# Patient Record
Sex: Male | Born: 1945 | Race: White | Hispanic: No | Smoking: Former smoker
Health system: Southern US, Community
[De-identification: ages and names within clinical notes are randomized; demographics above are authoritative.]

## PROBLEM LIST (undated history)

## (undated) DIAGNOSIS — Z85828 Personal history of other malignant neoplasm of skin: Secondary | ICD-10-CM

## (undated) DIAGNOSIS — K621 Rectal polyp: Secondary | ICD-10-CM

## (undated) DIAGNOSIS — E039 Hypothyroidism, unspecified: Secondary | ICD-10-CM

## (undated) DIAGNOSIS — I2699 Other pulmonary embolism without acute cor pulmonale: Secondary | ICD-10-CM

## (undated) DIAGNOSIS — H269 Unspecified cataract: Secondary | ICD-10-CM

## (undated) DIAGNOSIS — Z860101 Personal history of adenomatous and serrated colon polyps: Secondary | ICD-10-CM

## (undated) DIAGNOSIS — I10 Essential (primary) hypertension: Secondary | ICD-10-CM

## (undated) DIAGNOSIS — T7840XA Allergy, unspecified, initial encounter: Secondary | ICD-10-CM

## (undated) DIAGNOSIS — I714 Abdominal aortic aneurysm, without rupture, unspecified: Secondary | ICD-10-CM

## (undated) DIAGNOSIS — F329 Major depressive disorder, single episode, unspecified: Secondary | ICD-10-CM

## (undated) DIAGNOSIS — C4490 Unspecified malignant neoplasm of skin, unspecified: Secondary | ICD-10-CM

## (undated) DIAGNOSIS — N401 Enlarged prostate with lower urinary tract symptoms: Secondary | ICD-10-CM

## (undated) DIAGNOSIS — C801 Malignant (primary) neoplasm, unspecified: Secondary | ICD-10-CM

## (undated) DIAGNOSIS — K130 Diseases of lips: Secondary | ICD-10-CM

## (undated) DIAGNOSIS — Z973 Presence of spectacles and contact lenses: Secondary | ICD-10-CM

## (undated) DIAGNOSIS — F32A Depression, unspecified: Secondary | ICD-10-CM

## (undated) DIAGNOSIS — D751 Secondary polycythemia: Secondary | ICD-10-CM

## (undated) DIAGNOSIS — C439 Malignant melanoma of skin, unspecified: Secondary | ICD-10-CM

## (undated) DIAGNOSIS — Z86010 Personal history of colonic polyps: Secondary | ICD-10-CM

## (undated) DIAGNOSIS — Z7901 Long term (current) use of anticoagulants: Secondary | ICD-10-CM

## (undated) DIAGNOSIS — E079 Disorder of thyroid, unspecified: Secondary | ICD-10-CM

## (undated) DIAGNOSIS — C61 Malignant neoplasm of prostate: Secondary | ICD-10-CM

## (undated) DIAGNOSIS — J449 Chronic obstructive pulmonary disease, unspecified: Secondary | ICD-10-CM

## (undated) HISTORY — DX: Major depressive disorder, single episode, unspecified: F32.9

## (undated) HISTORY — PX: OTHER SURGICAL HISTORY: SHX169

## (undated) HISTORY — DX: Malignant melanoma of skin, unspecified: C43.9

## (undated) HISTORY — PX: PROSTATE SURGERY: SHX751

## (undated) HISTORY — DX: Other pulmonary embolism without acute cor pulmonale: I26.99

## (undated) HISTORY — DX: Malignant (primary) neoplasm, unspecified: C80.1

## (undated) HISTORY — PX: TONSILLECTOMY: SUR1361

## (undated) HISTORY — DX: Hypothyroidism, unspecified: E03.9

## (undated) HISTORY — DX: Unspecified cataract: H26.9

## (undated) HISTORY — DX: Allergy, unspecified, initial encounter: T78.40XA

## (undated) HISTORY — DX: Depression, unspecified: F32.A

## (undated) HISTORY — DX: Unspecified malignant neoplasm of skin, unspecified: C44.90

## (undated) HISTORY — DX: Malignant neoplasm of prostate: C61

---

## 2001-04-10 DIAGNOSIS — Z8546 Personal history of malignant neoplasm of prostate: Secondary | ICD-10-CM

## 2001-04-10 DIAGNOSIS — Z85820 Personal history of malignant melanoma of skin: Secondary | ICD-10-CM

## 2001-04-10 HISTORY — DX: Personal history of malignant melanoma of skin: Z85.820

## 2001-04-10 HISTORY — PX: RADIOACTIVE SEED IMPLANT: SHX5150

## 2001-04-10 HISTORY — DX: Personal history of malignant neoplasm of prostate: Z85.46

## 2001-04-10 HISTORY — PX: MELANOMA EXCISION: SHX5266

## 2001-10-24 ENCOUNTER — Ambulatory Visit (HOSPITAL_BASED_OUTPATIENT_CLINIC_OR_DEPARTMENT_OTHER): Admission: RE | Admit: 2001-10-24 | Discharge: 2001-10-24 | Payer: Self-pay | Admitting: *Deleted

## 2001-11-22 ENCOUNTER — Ambulatory Visit: Admission: RE | Admit: 2001-11-22 | Discharge: 2002-02-11 | Payer: Self-pay | Admitting: Radiation Oncology

## 2001-12-04 ENCOUNTER — Encounter: Admission: RE | Admit: 2001-12-04 | Discharge: 2001-12-04 | Payer: Self-pay | Admitting: Urology

## 2001-12-04 ENCOUNTER — Encounter: Payer: Self-pay | Admitting: Urology

## 2002-01-01 ENCOUNTER — Encounter: Payer: Self-pay | Admitting: Urology

## 2002-01-01 ENCOUNTER — Ambulatory Visit (HOSPITAL_BASED_OUTPATIENT_CLINIC_OR_DEPARTMENT_OTHER): Admission: RE | Admit: 2002-01-01 | Discharge: 2002-01-01 | Payer: Self-pay | Admitting: Urology

## 2002-01-21 ENCOUNTER — Ambulatory Visit: Admission: RE | Admit: 2002-01-21 | Discharge: 2002-01-21 | Payer: Self-pay | Admitting: Radiation Oncology

## 2005-12-19 ENCOUNTER — Ambulatory Visit: Payer: Self-pay | Admitting: Psychiatry

## 2005-12-19 ENCOUNTER — Inpatient Hospital Stay (HOSPITAL_COMMUNITY): Admission: AD | Admit: 2005-12-19 | Discharge: 2005-12-21 | Payer: Self-pay | Admitting: Psychiatry

## 2010-05-30 ENCOUNTER — Other Ambulatory Visit: Payer: Self-pay | Admitting: Dermatology

## 2011-08-21 ENCOUNTER — Other Ambulatory Visit: Payer: Self-pay | Admitting: Dermatology

## 2011-11-28 ENCOUNTER — Ambulatory Visit (INDEPENDENT_AMBULATORY_CARE_PROVIDER_SITE_OTHER): Payer: Managed Care, Other (non HMO) | Admitting: Internal Medicine

## 2011-11-28 VITALS — BP 118/72 | HR 75 | Temp 98.1°F | Resp 16 | Ht 73.5 in | Wt 136.0 lb

## 2011-11-28 DIAGNOSIS — H6120 Impacted cerumen, unspecified ear: Secondary | ICD-10-CM

## 2011-11-28 DIAGNOSIS — H9209 Otalgia, unspecified ear: Secondary | ICD-10-CM

## 2011-11-28 DIAGNOSIS — E079 Disorder of thyroid, unspecified: Secondary | ICD-10-CM | POA: Insufficient documentation

## 2011-11-28 DIAGNOSIS — H6980 Other specified disorders of Eustachian tube, unspecified ear: Secondary | ICD-10-CM

## 2011-11-28 DIAGNOSIS — R21 Rash and other nonspecific skin eruption: Secondary | ICD-10-CM

## 2011-11-28 NOTE — Progress Notes (Signed)
  Subjective:    Patient ID: Tyler Randall, male    DOB: July 07, 1945, 66 y.o.   MRN: [id number]  HPI 3 problem : rash arm itchy,scaly; ear popping and stopped up; wax buildup.   Review of Systems     Objective:   Physical Exam Wax in canals--irrigate Scaly,red papule with local eccymosis left arm TMs dull, decrease hearing       Assessment & Plan:  ETD Rash arm Cerumen impaction:

## 2011-11-28 NOTE — Patient Instructions (Addendum)
eust Barotitis Media Barotitis media is soreness (inflammation) of the area behind the eardrum (middle ear). This occurs when the auditory tube (Eustachian tube) leading from the back of the throat to the eardrum is blocked. When it is blocked air cannot move in and out of the middle ear to equalize pressure changes. These pressure changes come from changes in altitude when:  Flying.   Driving in the mountains.   Diving.  Problems are more likely to occur with pressure changes during times when you are congested as from:  Hay fever.   Upper respiratory infection.   A cold.  Damage or hearing loss (barotrauma) caused by this may be permanent. HOME CARE INSTRUCTIONS   Use medicines as recommended by your caregiver. Over the counter medicines will help unblock the canal and can help during times of air travel.   Do not put anything into your ears to clean or unplug them. Eardrops will not be helpful.   Do not swim, dive, or fly until your caregiver says it is all right to do so. If these activities are necessary, chewing gum with frequent swallowing may help. It is also helpful to hold your nose and gently blow to pop your ears for equalizing pressure changes. This forces air into the Eustachian tube.   For little ones with problems, give your baby a bottle of water or juice during periods when pressure changes would be anticipated such as during take offs and landings associated with air travel.   Only take over-the-counter or prescription medicines for pain, discomfort, or fever as directed by your caregiver.   A decongestant may be helpful in de-congesting the middle ear and make pressure equalization easier. This can be even more effective if the drops (spray) are delivered with the head lying over the edge of a bed with the head tilted toward the ear on the affected side.   If your caregiver has given you a follow-up appointment, it is very important to keep that appointment. Not  keeping the appointment could result in a chronic or permanent injury, pain, hearing loss and disability. If there is any problem keeping the appointment, you must call back to this facility for assistance.  SEEK IMMEDIATE MEDICAL CARE IF:   You develop a severe headache, dizziness, severe ear pain, or bloody or pus-like drainage from your ears.   An oral temperature above 102 F (38.9 C) develops.   Your problems do not improve or become worse.  MAKE SURE YOU:   Understand these instructions.   Will watch your condition.   Will get help right away if you are not doing well or get worse.  Document Released: 03/24/2000 Document Revised: 03/16/2011 Document Reviewed: 10/31/2007 [facility] Patient Information 2012 [place], [place].

## 2011-12-25 ENCOUNTER — Other Ambulatory Visit: Payer: Self-pay | Admitting: Emergency Medicine

## 2011-12-25 NOTE — Telephone Encounter (Signed)
Please pull paper chart.  

## 2011-12-25 NOTE — Telephone Encounter (Signed)
Patient's chart is at the nurses station in the pa pool pile.  [facility] [id number]

## 2012-02-07 ENCOUNTER — Ambulatory Visit (INDEPENDENT_AMBULATORY_CARE_PROVIDER_SITE_OTHER): Payer: Managed Care, Other (non HMO) | Admitting: Family Medicine

## 2012-02-07 VITALS — BP 120/70 | HR 65 | Temp 98.0°F | Resp 16 | Ht 74.0 in | Wt 139.6 lb

## 2012-02-07 DIAGNOSIS — E559 Vitamin D deficiency, unspecified: Secondary | ICD-10-CM

## 2012-02-07 DIAGNOSIS — R718 Other abnormality of red blood cells: Secondary | ICD-10-CM

## 2012-02-07 DIAGNOSIS — E039 Hypothyroidism, unspecified: Secondary | ICD-10-CM

## 2012-02-07 DIAGNOSIS — Z0000 Encounter for general adult medical examination without abnormal findings: Secondary | ICD-10-CM

## 2012-02-07 DIAGNOSIS — F32A Depression, unspecified: Secondary | ICD-10-CM

## 2012-02-07 DIAGNOSIS — F329 Major depressive disorder, single episode, unspecified: Secondary | ICD-10-CM

## 2012-02-07 DIAGNOSIS — Z23 Encounter for immunization: Secondary | ICD-10-CM

## 2012-02-07 LAB — COMPREHENSIVE METABOLIC PANEL
ALT: 12 U/L (ref 0–53)
AST: 20 U/L (ref 0–37)
Alkaline Phosphatase: 51 U/L (ref 39–117)
BUN: 9 mg/dL (ref 6–23)
Creat: 0.85 mg/dL (ref 0.50–1.35)
Potassium: 4.8 mEq/L (ref 3.5–5.3)

## 2012-02-07 LAB — POCT CBC
Granulocyte percent: 62.3 %G (ref 37–80)
HCT, POC: 49.2 % (ref 43.5–53.7)
Hemoglobin: 15.3 g/dL (ref 14.1–18.1)
MCHC: 31.1 g/dL — AB (ref 31.8–35.4)
MID (cbc): 0.5 (ref 0–0.9)
MPV: 11.2 fL (ref 0–99.8)
POC Granulocyte: 3.3 (ref 2–6.9)
POC LYMPH PERCENT: 29 %L (ref 10–50)
POC MID %: 8.7 %M (ref 0–12)
Platelet Count, POC: 234 10*3/uL (ref 142–424)
RBC: 4.83 M/uL (ref 4.69–6.13)
WBC: 5.3 10*3/uL (ref 4.6–10.2)

## 2012-02-07 LAB — LIPID PANEL
HDL: 88 mg/dL (ref >39)
LDL Cholesterol: 61 mg/dL (ref 0–99)
Triglycerides: 69 mg/dL (ref <150)
VLDL: 14 mg/dL (ref 0–40)

## 2012-02-07 MED ORDER — TADALAFIL 5 MG PO TABS: 5.0000 mg | ORAL_TABLET | ORAL | Status: DC | PRN

## 2012-02-07 MED ORDER — VITAMIN D (ERGOCALCIFEROL) 1.25 MG (50000 UNIT) PO CAPS: 50000.0000 [IU] | ORAL_CAPSULE | ORAL | Status: DC

## 2012-02-07 MED ORDER — LEVOTHYROXINE SODIUM 137 MCG PO TABS: 137.0000 ug | ORAL_TABLET | Freq: Every day | ORAL | Status: DC

## 2012-02-07 MED ORDER — MIRTAZAPINE 15 MG PO TABS: 15.0000 mg | ORAL_TABLET | Freq: Every day | ORAL | Status: DC

## 2012-02-07 NOTE — Progress Notes (Signed)
[facility] [address], [place] [address] [phone]  Date:  02/07/2012   Name:  Tyler Randall   DOB:  07-11-1945   MRN:  [id number]  PCP:  Lucilla Edin, MD    Chief Complaint: Medication Refill   History of Present Illness:  Tyler Randall is a 66 y.o. very pleasant male patient who presents with the following:  He needs refills of his medications and to have a check up today.  He feels that he is doing well.  He is fasting this morning.  He uses thyroid replacement, remeron, cialis prn and also takes vitamin D 50,000 u weekly.  He would like to have a flu shot today.   His last tetanus was 4 or 5 years ago.    He has had prostate cancer- he had radioactive seed implants 10 years ago and has done well.  Dr. Isabel Caprice checked his PSA and urine last week.    He is a smoker.    Patient Active Problem List  Diagnosis  . Thyroid condition    Past Medical History  Diagnosis Date  . Depression     Past Surgical History  Procedure Date  . Prostate surgery     History  Substance Use Topics  . Smoking status: Current Every Day Smoker -- 0.5 packs/day  . Smokeless tobacco: Not on file  . Alcohol Use: Not on file    No family history on file.  Allergies  Allergen Reactions  . Latex     With prolonged exposure  . Sulfa Antibiotics     Not sure reaction, started in infancy    Medication list has been reviewed and updated.  Current Outpatient Prescriptions on File Prior to Visit  Medication Sig Dispense Refill  . levothyroxine (SYNTHROID, LEVOTHROID) 137 MCG tablet Take 137 mcg by mouth daily.      . mirtazapine (REMERON) 15 MG tablet Take 15 mg by mouth at bedtime.      . tadalafil (CIALIS) 20 MG tablet Take 20 mg by mouth as needed. Patient states he cuts pill in half and takes 10 mg PRN      . Vitamin D, Ergocalciferol, (DRISDOL) 50000 UNITS CAPS Take 50,000 Units by mouth every 7 (seven) days.        Review of Systems:  As per HPI-  otherwise negative.   Physical Examination: Filed Vitals:   02/07/12 0829  BP: 96/64  Pulse: 65  Temp: 98 F (36.7 C)  Resp: 16   Filed Vitals:   02/07/12 0829  Height: 6\' 2"  (1.88 m)  Weight: 139 lb 9.6 oz (63.322 kg)   Body mass index is 17.92 kg/(m^2). Ideal Body Weight: Weight in (lb) to have BMI = 25: 194.3   GEN: WDWN, NAD, Non-toxic, A & O x 3, slim build HEENT: Atraumatic, Normocephalic. Neck supple. No masses, No LAD. Ears and Nose: No external deformity. CV: RRR, No M/G/R. No JVD. No thrill. No extra heart sounds. PULM: CTA B, no wheezes, crackles, rhonchi. No retractions. No resp. distress. No accessory muscle use. ABD: S, NT, ND EXTR: No c/c/e NEURO Normal gait.  PSYCH: Normally interactive. Conversant. Not depressed or anxious appearing.  Calm demeanor.   Results for orders placed in visit on 02/07/12  POCT CBC      Component Value Range   WBC 5.3  4.6 - 10.2 K/uL   Lymph, poc 1.5  0.6 - 3.4   POC LYMPH PERCENT 29.0  10 - 50 %L   MID (cbc) 0.5  0 - 0.9   POC MID % 8.7  0 - 12 %M   POC Granulocyte 3.3  2 - 6.9   Granulocyte percent 62.3  37 - 80 %G   RBC 4.83  4.69 - 6.13 M/uL   Hemoglobin 15.3  14.1 - 18.1 g/dL   HCT, POC 16.1  09.6 - 53.7 %   MCV 101.9 (*) 80 - 97 fL   MCH, POC 31.7 (*) 27 - 31.2 pg   MCHC 31.1 (*) 31.8 - 35.4 g/dL   RDW, POC 04.5     Platelet Count, POC 234  142 - 424 K/uL   MPV 11.2  0 - 99.8 fL  COMPREHENSIVE METABOLIC PANEL      Component Value Range   Sodium 140  135 - 145 mEq/L   Potassium 4.8  3.5 - 5.3 mEq/L   Chloride 103  96 - 112 mEq/L   CO2 29  19 - 32 mEq/L   Glucose, Bld 87  70 - 99 mg/dL   BUN 9  6 - 23 mg/dL   Creat 4.09  8.11 - 9.14 mg/dL   Total Bilirubin 2.1 (*) 0.3 - 1.2 mg/dL   Alkaline Phosphatase 51  39 - 117 U/L   AST 20  0 - 37 U/L   ALT 12  0 - 53 U/L   Total Protein 7.0  6.0 - 8.3 g/dL   Albumin 4.4  3.5 - 5.2 g/dL   Calcium 9.4  8.4 - 78.2 mg/dL  TSH      Component Value Range   TSH 2.851   0.350 - 4.500 uIU/mL  LIPID PANEL      Component Value Range   Cholesterol 163  0 - 200 mg/dL   Triglycerides 69  <956 mg/dL   HDL 88  >21 mg/dL   Total CHOL/HDL Ratio 1.9     VLDL 14  0 - 40 mg/dL   LDL Cholesterol 61  0 - 99 mg/dL    Assessment and Plan: 1. Physical exam, annual  POCT CBC, Comprehensive metabolic panel, Lipid panel, Flu vaccine greater than or equal to 3yo preservative free IM, tadalafil (CIALIS) 5 MG tablet  2. Hypothyroidism  TSH, levothyroxine (SYNTHROID, LEVOTHROID) 137 MCG tablet  3. Vitamin D deficiency  Vitamin D, Ergocalciferol, (DRISDOL) 50000 UNITS CAPS  4. Depression  mirtazapine (REMERON) 15 MG tablet   Meds ordered this encounter  Medications  . levothyroxine (SYNTHROID, LEVOTHROID) 137 MCG tablet    Sig: Take 1 tablet (137 mcg total) by mouth daily.    Dispense:  90 tablet    Refill:  3  . mirtazapine (REMERON) 15 MG tablet    Sig: Take 1 tablet (15 mg total) by mouth at bedtime.    Dispense:  90 tablet    Refill:  3  . tadalafil (CIALIS) 5 MG tablet    Sig: Take 1 tablet (5 mg total) by mouth as needed.    Dispense:  15 tablet    Refill:  11  . Vitamin D, Ergocalciferol, (DRISDOL) 50000 UNITS CAPS    Sig: Take 1 capsule (50,000 Units total) by mouth every 7 (seven) days.    Dispense:  30 capsule    Refill:  3   Refilled meds, follow up pending labs  Allexis Bordenave, MD

## 2012-02-07 NOTE — Patient Instructions (Signed)
Great to see you today- I will be in touch with your labs as soon as possible.  As we discussed, it would be a good idea to wait to fill your thyroid replacement until I contact you to be sure we do not need to adjust the dosage.   Think about quitting smoking for your health and have a wonderful fall.

## 2012-02-10 ENCOUNTER — Encounter: Payer: Self-pay | Admitting: Family Medicine

## 2012-02-10 NOTE — Addendum Note (Signed)
Addended by: Abbe Amsterdam C on: 02/10/2012 07:36 AM   Modules accepted: Orders

## 2012-04-10 HISTORY — PX: POLYPECTOMY: SHX149

## 2012-04-10 HISTORY — PX: COLONOSCOPY: SHX174

## 2012-12-19 ENCOUNTER — Encounter: Payer: Self-pay | Admitting: Gastroenterology

## 2013-01-01 ENCOUNTER — Encounter: Payer: Self-pay | Admitting: Gastroenterology

## 2013-01-29 ENCOUNTER — Ambulatory Visit (AMBULATORY_SURGERY_CENTER): Payer: Self-pay

## 2013-01-29 VITALS — Ht 74.0 in | Wt 140.0 lb

## 2013-01-29 DIAGNOSIS — Z860100 Personal history of colon polyps, unspecified: Secondary | ICD-10-CM

## 2013-01-29 MED ORDER — MOVIPREP 100 G PO SOLR: 1.0000 | Freq: Once | ORAL | Status: DC

## 2013-02-05 ENCOUNTER — Encounter: Payer: Self-pay | Admitting: Gastroenterology

## 2013-02-12 ENCOUNTER — Ambulatory Visit (AMBULATORY_SURGERY_CENTER): Payer: Medicare Other | Admitting: Gastroenterology

## 2013-02-12 ENCOUNTER — Encounter: Payer: Self-pay | Admitting: Gastroenterology

## 2013-02-12 VITALS — BP 126/78 | HR 58 | Temp 97.3°F | Resp 13 | Ht 74.0 in | Wt 139.0 lb

## 2013-02-12 DIAGNOSIS — Z86010 Personal history of colonic polyps: Secondary | ICD-10-CM

## 2013-02-12 DIAGNOSIS — E039 Hypothyroidism, unspecified: Secondary | ICD-10-CM | POA: Diagnosis not present

## 2013-02-12 DIAGNOSIS — K5730 Diverticulosis of large intestine without perforation or abscess without bleeding: Secondary | ICD-10-CM | POA: Diagnosis not present

## 2013-02-12 DIAGNOSIS — Z1211 Encounter for screening for malignant neoplasm of colon: Secondary | ICD-10-CM

## 2013-02-12 DIAGNOSIS — F959 Tic disorder, unspecified: Secondary | ICD-10-CM | POA: Diagnosis not present

## 2013-02-12 DIAGNOSIS — D126 Benign neoplasm of colon, unspecified: Secondary | ICD-10-CM

## 2013-02-12 MED ORDER — SODIUM CHLORIDE 0.9 % IV SOLN: 500.0000 mL | INTRAVENOUS | Status: DC

## 2013-02-12 NOTE — Progress Notes (Signed)
Called into room after procedure done; verified pt with Florina Ou and CRNA.  Pathology information was given by Florina Ou and then entered into computer

## 2013-02-12 NOTE — Patient Instructions (Signed)
Discharge instructions given with verbal understanding. Handouts on polyps and diverticulosis. Resume previous medications. YOU HAD AN ENDOSCOPIC PROCEDURE TODAY AT THE [facility]: Refer to the procedure report that was given to you for any specific questions about what was found during the examination.  If the procedure report does not answer your questions, please call your gastroenterologist to clarify.  If you requested that your care partner not be given the details of your procedure findings, then the procedure report has been included in a sealed envelope for you to review at your convenience later.  YOU SHOULD EXPECT: Some feelings of bloating in the abdomen. Passage of more gas than usual.  Walking can help get rid of the air that was put into your GI tract during the procedure and reduce the bloating. If you had a lower endoscopy (such as a colonoscopy or flexible sigmoidoscopy) you may notice spotting of blood in your stool or on the toilet paper. If you underwent a bowel prep for your procedure, then you may not have a normal bowel movement for a few days.  DIET: Your first meal following the procedure should be a light meal and then it is ok to progress to your normal diet.  A half-sandwich or bowl of soup is an example of a good first meal.  Heavy or fried foods are harder to digest and may make you feel nauseous or bloated.  Likewise meals heavy in dairy and vegetables can cause extra gas to form and this can also increase the bloating.  Drink plenty of fluids but you should avoid alcoholic beverages for 24 hours.  ACTIVITY: Your care partner should take you home directly after the procedure.  You should plan to take it easy, moving slowly for the rest of the day.  You can resume normal activity the day after the procedure however you should NOT DRIVE or use heavy machinery for 24 hours (because of the sedation medicines used during the test).    SYMPTOMS TO REPORT  IMMEDIATELY: A gastroenterologist can be reached at any hour.  During normal business hours, 8:30 AM to 5:00 PM Monday through Friday, call [phone].  After hours and on weekends, please call the GI answering service at [phone] who will take a message and have the physician on call contact you.   Following lower endoscopy (colonoscopy or flexible sigmoidoscopy):  Excessive amounts of blood in the stool  Significant tenderness or worsening of abdominal pains  Swelling of the abdomen that is new, acute  Fever of 100F or higher  FOLLOW UP: If any biopsies were taken you will be contacted by phone or by letter within the next 1-3 weeks.  Call your gastroenterologist if you have not heard about the biopsies in 3 weeks.  Our staff will call the home number listed on your records the next business day following your procedure to check on you and address any questions or concerns that you may have at that time regarding the information given to you following your procedure. This is a courtesy call and so if there is no answer at the home number and we have not heard from you through the emergency physician on call, we will assume that you have returned to your regular daily activities without incident.  SIGNATURES/CONFIDENTIALITY: You and/or your care partner have signed paperwork which will be entered into your electronic medical record.  These signatures attest to the fact that that the information above on your After Visit Summary   has been reviewed and is understood.  Full responsibility of the confidentiality of this discharge information lies with you and/or your care-partner. 

## 2013-02-12 NOTE — Progress Notes (Signed)
  [facility] Anesthesia Post-op Note  Patient: Tyler Randall  Procedure(s) Performed: colonoscopy  Patient Location: [facility] - Recovery Area  Anesthesia Type: Deep Sedation/Propofol  Level of Consciousness: awake, oriented and patient cooperative  Airway and Oxygen Therapy: Patient Spontanous Breathing  Post-op Pain: none  Post-op Assessment:  Post-op Vital signs reviewed, Patient's Cardiovascular Status Stable, Respiratory Function Stable, Patent Airway, No signs of Nausea or vomiting and Pain level controlled  Post-op Vital Signs: Reviewed and stable  Complications: No apparent anesthesia complications  Peytyn Trine E 10:35 AM

## 2013-02-12 NOTE — Progress Notes (Signed)
Patient did not experience any of the following events: a burn prior to discharge; a fall within the facility; wrong site/side/patient/procedure/implant event; or a hospital transfer or hospital admission upon discharge from the facility. (G8907) Patient did not have preoperative order for IV antibiotic SSI prophylaxis. (G8918)  

## 2013-02-12 NOTE — Op Note (Signed)
[facility] [address] [place], [address]   COLONOSCOPY PROCEDURE REPORT  PATIENT: Tyler Randall, Tyler Randall  MR#: [id number] BIRTHDATE: [birth year] , 67  yrs. old GENDER: Male ENDOSCOPIST: Mardella Layman, MD, [facility] REFERRED QM:VHQION Cleta Alberts, M.D. PROCEDURE DATE:  02/12/2013 PROCEDURE:   Colonoscopy with biopsy and Colonoscopy with snare polypectomy First Screening Colonoscopy - Avg.  risk and is 50 yrs.  old or older - No.      History of Adenoma - Now for follow-up colonoscopy & has been > or = to 3 yrs.  N/A  Polyps Removed Today? Yes. ASA CLASS:   Class II INDICATIONS:average risk screening. MEDICATIONS: propofol (Diprivan) 200mg  IV  DESCRIPTION OF PROCEDURE:   After the risks benefits and alternatives of the procedure were thoroughly explained, informed consent was obtained.  A digital rectal exam revealed no abnormalities of the rectum.   The LB [id number]  endoscope was introduced through the anus and advanced to the cecum, which was identified by both the appendix and ileocecal valve. No adverse events experienced.   The quality of the prep was excellent, using MoviPrep  The instrument was then slowly withdrawn as the colon was fully examined.      COLON FINDINGS: Mild diverticulosis was noted in the sigmoid colon and descending colon.   Two smooth flat polyps ranging between 3-11mm in size were found in the descending colon and rectum.  A polypectomy was performed with a cold snare and with cold forceps. The resection was complete and the polyp tissue was completely retrieved.   The colon was otherwise normal.  There was no inflammation, polyps or cancers unless previously stated. Retroflexed views revealed no abnormalities. The time to cecum=2 minutes 34 seconds.  Withdrawal time=11 minutes 43 seconds.  The scope was withdrawn and the procedure completed. COMPLICATIONS: There were no complications.  ENDOSCOPIC IMPRESSION: 1.   Mild  diverticulosis was noted in the sigmoid colon and descending colon 2.   Two flat polyps ranging between 3-108mm in size were found in the descending colon and rectum; polypectomy was performed with a cold snare and with cold forceps 3.   The colon was otherwise normal  RECOMMENDATIONS: 1.  Await pathology results 2.  Repeat colonoscopy in 5 years if polyp adenomatous; otherwise 10 years 3.  High fiber diet 4.  Continue current medications   eSigned:  Mardella Layman, MD, [facility] 02/12/2013 10:34 AM   cc:

## 2013-02-13 ENCOUNTER — Telehealth: Payer: Self-pay

## 2013-02-13 NOTE — Telephone Encounter (Signed)
  Follow up Call-  Call back number 02/12/2013  Post procedure Call Back phone  # [phone]  Permission to leave phone message Yes     Patient questions:  Do you have a fever, pain , or abdominal swelling? no Pain Score  0 *  Have you tolerated food without any problems? yes  Have you been able to return to your normal activities? yes  Do you have any questions about your discharge instructions: Diet   no Medications  no Follow up visit  no  Do you have questions or concerns about your Care? no  Actions: * If pain score is 4 or above: No action needed, pain <4.  Per the pt he said he was going to stay home and not go to work today.  He said, "I feel tird".  I advised him to give [place] a call today if he does not get back to normal soon.  He agreed to do so. Maw

## 2013-02-16 ENCOUNTER — Encounter: Payer: Self-pay | Admitting: Family Medicine

## 2013-02-17 ENCOUNTER — Encounter: Payer: Self-pay | Admitting: Gastroenterology

## 2013-02-20 ENCOUNTER — Ambulatory Visit: Payer: Medicare Other

## 2013-02-20 ENCOUNTER — Ambulatory Visit (INDEPENDENT_AMBULATORY_CARE_PROVIDER_SITE_OTHER): Payer: Medicare Other | Admitting: Emergency Medicine

## 2013-02-20 VITALS — BP 108/68 | HR 70 | Temp 98.0°F | Resp 16 | Ht 74.5 in | Wt 136.4 lb

## 2013-02-20 DIAGNOSIS — F329 Major depressive disorder, single episode, unspecified: Secondary | ICD-10-CM

## 2013-02-20 DIAGNOSIS — E559 Vitamin D deficiency, unspecified: Secondary | ICD-10-CM

## 2013-02-20 DIAGNOSIS — C439 Malignant melanoma of skin, unspecified: Secondary | ICD-10-CM

## 2013-02-20 DIAGNOSIS — Z23 Encounter for immunization: Secondary | ICD-10-CM | POA: Diagnosis not present

## 2013-02-20 DIAGNOSIS — Z0000 Encounter for general adult medical examination without abnormal findings: Secondary | ICD-10-CM | POA: Diagnosis not present

## 2013-02-20 DIAGNOSIS — E039 Hypothyroidism, unspecified: Secondary | ICD-10-CM | POA: Diagnosis not present

## 2013-02-20 DIAGNOSIS — C61 Malignant neoplasm of prostate: Secondary | ICD-10-CM

## 2013-02-20 DIAGNOSIS — F17200 Nicotine dependence, unspecified, uncomplicated: Secondary | ICD-10-CM

## 2013-02-20 DIAGNOSIS — F32A Depression, unspecified: Secondary | ICD-10-CM

## 2013-02-20 LAB — POCT URINALYSIS DIPSTICK
Bilirubin, UA: NEGATIVE
Glucose, UA: NEGATIVE
Leukocytes, UA: NEGATIVE
Nitrite, UA: NEGATIVE
pH, UA: 5.5

## 2013-02-20 LAB — POCT CBC
HCT, POC: 52.7 % (ref 43.5–53.7)
Hemoglobin: 16.7 g/dL (ref 14.1–18.1)
MCHC: 31.7 g/dL — AB (ref 31.8–35.4)
MPV: 10.8 fL (ref 0–99.8)
POC Granulocyte: 2.7 (ref 2–6.9)
POC MID %: 9.7 %M (ref 0–12)
RBC: 5.16 M/uL (ref 4.69–6.13)

## 2013-02-20 LAB — LIPID PANEL
HDL: 82 mg/dL (ref >39)
LDL Cholesterol: 74 mg/dL (ref 0–99)
Total CHOL/HDL Ratio: 2 Ratio
VLDL: 11 mg/dL (ref 0–40)

## 2013-02-20 LAB — COMPREHENSIVE METABOLIC PANEL
ALT: 12 U/L (ref 0–53)
AST: 19 U/L (ref 0–37)
Alkaline Phosphatase: 57 U/L (ref 39–117)
CO2: 29 mEq/L (ref 19–32)
Calcium: 9.5 mg/dL (ref 8.4–10.5)
Chloride: 102 mEq/L (ref 96–112)
Creat: 0.83 mg/dL (ref 0.50–1.35)
Sodium: 140 mEq/L (ref 135–145)

## 2013-02-20 MED ORDER — LEVOTHYROXINE SODIUM 137 MCG PO TABS: 137.0000 ug | ORAL_TABLET | Freq: Every day | ORAL | Status: DC

## 2013-02-20 MED ORDER — VITAMIN D (ERGOCALCIFEROL) 1.25 MG (50000 UNIT) PO CAPS: 50000.0000 [IU] | ORAL_CAPSULE | ORAL | Status: DC

## 2013-02-20 MED ORDER — MIRTAZAPINE 15 MG PO TABS: 15.0000 mg | ORAL_TABLET | Freq: Every day | ORAL | Status: DC

## 2013-02-20 NOTE — Progress Notes (Addendum)
This chart was scribed for Lesle Chris, MD by Ardelia Mems, Scribe. This patient was seen in room 13 and the patient's care was started at 8:08 AM.  Subjective:    Patient ID: Tyler Randall, male    DOB: May 28, 1945, 67 y.o.   MRN: [id number]  HPI  Chief Complaint  Patient presents with  . Annual Exam    HPI Comments: Tyler Randall is a 67 y.o. Male, who works as a Research scientist (medical), with a history of CA who presents to [facility] for an annual physical evaluation. He states that he had a colonoscopy last week, which was normal except for a couple polyps. He states that he was initially diagnosed with CA in 2003. He states that his PSA recently went up to 0.53. He states that he has been seeing his skin doctor annually, and that he is due to see him next month. He states that he has recently failed a respirator test at work. He says that he has to occasionally spray polyurethane, which is why he needs to take this test. He states that he does not feel short of breath. He states that he last had a CXR a couple of years ago. He states that he has a 47 year smoking history of 1 pack/day, and that he is now down to 10 smokes a day, using an electronic cigarette. He states that he has never used dip or chew.  He denies cramping in his legs or pain in his feet when he walks. He states that he suffers with Raynaud's disease, with the symptom of pain in his hands, and he states that this runs in the family. He states that he has no family history of lung CA. He states that he has not had his influenza vaccination this year. He states that he has never had the pneumonia vaccine. He is also inquiring about the shingles vaccine. He states that his Tetanus vaccinations are UTD.  Past Medical History  Diagnosis Date  . Depression   . Allergy   . Cancer    Current Outpatient Prescriptions on File Prior to Visit  Medication Sig Dispense Refill  . levothyroxine (SYNTHROID, LEVOTHROID) 137 MCG  tablet Take 1 tablet (137 mcg total) by mouth daily.  90 tablet  3  . mirtazapine (REMERON) 15 MG tablet Take 1 tablet (15 mg total) by mouth at bedtime.  90 tablet  3  . Multiple Vitamins-Minerals (CENTRUM SILVER PO) Take 1 tablet by mouth.      . tadalafil (CIALIS) 5 MG tablet Take 1 tablet (5 mg total) by mouth as needed.  15 tablet  11  . Vitamin D, Ergocalciferol, (DRISDOL) 50000 UNITS CAPS Take 1 capsule (50,000 Units total) by mouth every 7 (seven) days.  30 capsule  3   No current facility-administered medications on file prior to visit.   Allergies  Allergen Reactions  . Latex     With prolonged exposure  . Sulfa Antibiotics     Not sure reaction, started in infancy    Review of Systems  Respiratory: Negative for shortness of breath.       BP 108/68  Pulse 70  Temp(Src) 98 F (36.7 C) (Oral)  Resp 16  Ht 6' 2.5" (1.892 m)  Wt 136 lb 6.4 oz (61.871 kg)  BMI 17.28 kg/m2  SpO2 98% Objective:   Physical Exam  CONSTITUTIONAL: Well developed/well nourished HEAD: Normocephalic/atraumatic EYES: EOMI/PERRL ENMT: Mucous membranes moist NECK: supple no meningeal  signs SPINE:entire spine nontender CV: S1/S2 noted, no murmurs/rubs/gallops noted LUNGS: Lungs are clear to auscultation bilaterally, no apparent distress ABDOMEN: soft, nontender, no rebound or guarding GU:no cva tenderness NEURO: Pt is awake/alert, moves all extremitiesx4 EXTREMITIES: pulses normal, full ROM SKIN: 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    Results for orders placed in visit on 02/20/13  POCT CBC      Result Value Range   WBC 4.7  4.6 - 10.2 K/uL   Lymph, poc 1.6  0.6 - 3.4   POC LYMPH PERCENT 33.0  10 - 50 %L   MID (cbc) 0.5  0 - 0.9   POC MID % 9.7  0 - 12 %M   POC Granulocyte 2.7   2 - 6.9   Granulocyte percent 57.3  37 - 80 %G   RBC 5.16  4.69 - 6.13 M/uL   Hemoglobin 16.7  14.1 - 18.1 g/dL   HCT, POC 16.1  09.6 - 53.7 %   MCV 102.1 (*) 80 - 97 fL   MCH, POC 32.4 (*) 27 - 31.2 pg   MCHC 31.7 (*) 31.8 - 35.4 g/dL   RDW, POC 04.5     Platelet Count, POC 176  142 - 424 K/uL   MPV 10.8  0 - 99.8 fL  POCT URINALYSIS DIPSTICK      Result Value Range   Color, UA yellow     Clarity, UA clear     Glucose, UA neg     Bilirubin, UA neg     Ketones, UA neg     Spec Grav, UA 1.020     Blood, UA neg     pH, UA 5.5     Protein, UA neg     Urobilinogen, UA 0.2     Nitrite, UA neg     Leukocytes, UA Negative    [facility] reading (PRIMARY) by  Dr. Cleta Alberts patient has evidence of COPD but no nodules   Assessment & Plan:  The primary encounter diagnosis was Routine general medical examination at a health care facility. Diagnoses of Hypothyroidism, Melanoma of skin, site unspecified, Prostate cancer, and Smoker were also pertinent to this visit. All meds were refilled. Routine labs were done to include his thyroid and vitamin D. He was advised to quit smoking  I personally performed the services described in this documentation, which was scribed in my presence. The recorded information has been reviewed and is accurate.

## 2013-02-20 NOTE — Patient Instructions (Signed)
Smoking Cessation Quitting smoking is important to your health and has many advantages. However, it is not always easy to quit since nicotine is a very addictive drug. Often times, people try 3 times or more before being able to quit. This document explains the best ways for you to prepare to quit smoking. Quitting takes hard work and a lot of effort, but you can do it. ADVANTAGES OF QUITTING SMOKING  You will live longer, feel better, and live better.  Your body will feel the impact of quitting smoking almost immediately.  Within 20 minutes, blood pressure decreases. Your pulse returns to its normal level.  After 8 hours, carbon monoxide levels in the blood return to normal. Your oxygen level increases.  After 24 hours, the chance of having a heart attack starts to decrease. Your breath, hair, and body stop smelling like smoke.  After 48 hours, damaged nerve endings begin to recover. Your sense of taste and smell improve.  After 72 hours, the body is virtually free of nicotine. Your bronchial tubes relax and breathing becomes easier.  After 2 to 12 weeks, lungs can hold more air. Exercise becomes easier and circulation improves.  The risk of having a heart attack, stroke, cancer, or lung disease is greatly reduced.  After 1 year, the risk of coronary heart disease is cut in half.  After 5 years, the risk of stroke falls to the same as a nonsmoker.  After 10 years, the risk of lung cancer is cut in half and the risk of other cancers decreases significantly.  After 15 years, the risk of coronary heart disease drops, usually to the level of a nonsmoker.  If you are pregnant, quitting smoking will improve your chances of having a healthy baby.  The people you live with, especially any children, will be healthier.  You will have extra money to spend on things other than cigarettes. QUESTIONS TO THINK ABOUT BEFORE ATTEMPTING TO QUIT You may want to talk about your answers with your  caregiver.  Why do you want to quit?  If you tried to quit in the past, what helped and what did not?  What will be the most difficult situations for you after you quit? How will you plan to handle them?  Who can help you through the tough times? Your family? Friends? A caregiver?  What pleasures do you get from smoking? What ways can you still get pleasure if you quit? Here are some questions to ask your caregiver:  How can you help me to be successful at quitting?  What medicine do you think would be best for me and how should I take it?  What should I do if I need more help?  What is smoking withdrawal like? How can I get information on withdrawal? GET READY  Set a quit date.  Change your environment by getting rid of all cigarettes, ashtrays, matches, and lighters in your home, car, or work. Do not let people smoke in your home.  Review your past attempts to quit. Think about what worked and what did not. GET SUPPORT AND ENCOURAGEMENT You have a better chance of being successful if you have help. You can get support in many ways.  Tell your family, friends, and co-workers that you are going to quit and need their support. Ask them not to smoke around you.  Get individual, group, or telephone counseling and support. Programs are available at local hospitals and health centers. Call your local health department for   information about programs in your area.  Spiritual beliefs and practices may help some smokers quit.  Download a "quit meter" on your computer to keep track of quit statistics, such as how long you have gone without smoking, cigarettes not smoked, and money saved.  Get a self-help book about quitting smoking and staying off of tobacco. LEARN NEW SKILLS AND BEHAVIORS  Distract yourself from urges to smoke. Talk to someone, go for a walk, or occupy your time with a task.  Change your normal routine. Take a different route to work. Drink tea instead of coffee.  Eat breakfast in a different place.  Reduce your stress. Take a hot bath, exercise, or read a book.  Plan something enjoyable to do every day. Reward yourself for not smoking.  Explore interactive web-based programs that specialize in helping you quit. GET MEDICINE AND USE IT CORRECTLY Medicines can help you stop smoking and decrease the urge to smoke. Combining medicine with the above behavioral methods and support can greatly increase your chances of successfully quitting smoking.  Nicotine replacement therapy helps deliver nicotine to your body without the negative effects and risks of smoking. Nicotine replacement therapy includes nicotine gum, lozenges, inhalers, nasal sprays, and skin patches. Some may be available over-the-counter and others require a prescription.  Antidepressant medicine helps people abstain from smoking, but how this works is unknown. This medicine is available by prescription.  Nicotinic receptor partial agonist medicine simulates the effect of nicotine in your brain. This medicine is available by prescription. Ask your caregiver for advice about which medicines to use and how to use them based on your health history. Your caregiver will tell you what side effects to look out for if you choose to be on a medicine or therapy. Carefully read the information on the package. Do not use any other product containing nicotine while using a nicotine replacement product.  RELAPSE OR DIFFICULT SITUATIONS Most relapses occur within the first 3 months after quitting. Do not be discouraged if you start smoking again. Remember, most people try several times before finally quitting. You may have symptoms of withdrawal because your body is used to nicotine. You may crave cigarettes, be irritable, feel very hungry, cough often, get headaches, or have difficulty concentrating. The withdrawal symptoms are only temporary. They are strongest when you first quit, but they will go away within  10 14 days. To reduce the chances of relapse, try to:  Avoid drinking alcohol. Drinking lowers your chances of successfully quitting.  Reduce the amount of caffeine you consume. Once you quit smoking, the amount of caffeine in your body increases and can give you symptoms, such as a rapid heartbeat, sweating, and anxiety.  Avoid smokers because they can make you want to smoke.  Do not let weight gain distract you. Many smokers will gain weight when they quit, usually less than 10 pounds. Eat a healthy diet and stay active. You can always lose the weight gained after you quit.  Find ways to improve your mood other than smoking. FOR MORE INFORMATION  www.smokefree.gov  Document Released: 03/21/2001 Document Revised: 09/26/2011 Document Reviewed: 07/06/2011 [employer] Patient Information 2014 [employer].  

## 2013-02-21 LAB — VITAMIN D 25 HYDROXY (VIT D DEFICIENCY, FRACTURES): Vit D, 25-Hydroxy: 67 ng/mL (ref 30–89)

## 2013-03-10 DIAGNOSIS — Q828 Other specified congenital malformations of skin: Secondary | ICD-10-CM | POA: Diagnosis not present

## 2013-03-10 DIAGNOSIS — D2360 Other benign neoplasm of skin of unspecified upper limb, including shoulder: Secondary | ICD-10-CM | POA: Diagnosis not present

## 2013-03-10 DIAGNOSIS — Z85828 Personal history of other malignant neoplasm of skin: Secondary | ICD-10-CM | POA: Diagnosis not present

## 2013-03-10 DIAGNOSIS — D239 Other benign neoplasm of skin, unspecified: Secondary | ICD-10-CM | POA: Diagnosis not present

## 2013-03-10 DIAGNOSIS — D485 Neoplasm of uncertain behavior of skin: Secondary | ICD-10-CM | POA: Diagnosis not present

## 2013-03-10 DIAGNOSIS — L820 Inflamed seborrheic keratosis: Secondary | ICD-10-CM | POA: Diagnosis not present

## 2013-03-10 DIAGNOSIS — L570 Actinic keratosis: Secondary | ICD-10-CM | POA: Diagnosis not present

## 2013-03-10 DIAGNOSIS — Z85820 Personal history of malignant melanoma of skin: Secondary | ICD-10-CM | POA: Diagnosis not present

## 2013-03-11 ENCOUNTER — Other Ambulatory Visit: Payer: Self-pay | Admitting: Radiology

## 2013-03-11 MED ORDER — ZOSTER VACCINE LIVE 19400 UNT/0.65ML ~~LOC~~ SOLR: 0.6500 mL | Freq: Once | SUBCUTANEOUS | Status: DC

## 2013-03-12 ENCOUNTER — Telehealth: Payer: Self-pay

## 2013-03-12 NOTE — Telephone Encounter (Signed)
PT STATES HE WENT TO THE PHARMACY FOR A SHINGLES SHOT AND HIS INSURANCE WON'T PAY FOR IT, THEY WILL ONLY PAY IF HE GET IT FROM A DR'S OFFICE WANTED TO KNOW IF WE KNOW A DR'S OFFICE HE CAN GET IT FROM. PLEASE CALL [phone]

## 2013-03-13 NOTE — Telephone Encounter (Signed)
He can try the health department, called to advise.

## 2013-08-11 DIAGNOSIS — C61 Malignant neoplasm of prostate: Secondary | ICD-10-CM | POA: Diagnosis not present

## 2013-08-18 DIAGNOSIS — C61 Malignant neoplasm of prostate: Secondary | ICD-10-CM | POA: Diagnosis not present

## 2013-10-06 ENCOUNTER — Ambulatory Visit (INDEPENDENT_AMBULATORY_CARE_PROVIDER_SITE_OTHER): Payer: Managed Care, Other (non HMO) | Admitting: Emergency Medicine

## 2013-10-06 VITALS — BP 135/85 | HR 86 | Temp 98.2°F | Resp 18 | Ht 74.0 in | Wt 139.0 lb

## 2013-10-06 DIAGNOSIS — Z85820 Personal history of malignant melanoma of skin: Secondary | ICD-10-CM

## 2013-10-06 DIAGNOSIS — C439 Malignant melanoma of skin, unspecified: Secondary | ICD-10-CM | POA: Insufficient documentation

## 2013-10-06 DIAGNOSIS — E559 Vitamin D deficiency, unspecified: Secondary | ICD-10-CM | POA: Insufficient documentation

## 2013-10-06 DIAGNOSIS — L820 Inflamed seborrheic keratosis: Secondary | ICD-10-CM | POA: Diagnosis not present

## 2013-10-06 DIAGNOSIS — E039 Hypothyroidism, unspecified: Secondary | ICD-10-CM | POA: Insufficient documentation

## 2013-10-06 NOTE — Progress Notes (Signed)
   Subjective:    Patient ID: Tyler Randall, male    DOB: [birth year], 68 y.o.   MRN: [id number]  HPI 68 year old male pt here for possible melanoma on his back. Pt has hx of melanoma. He has had the spot for about two weeks. It looks scaly and has a dark patch in the center.    Review of Systems     Objective:   Physical Exam there is a 3 x 4 mm pigmented area over the back. This area was cleaned with Betadine with 2 cc of 2% plain and removed by punch biopsy.        Assessment & Plan:  Lesion sent for pathology.

## 2014-02-09 DIAGNOSIS — C61 Malignant neoplasm of prostate: Secondary | ICD-10-CM | POA: Diagnosis not present

## 2014-03-17 DIAGNOSIS — L570 Actinic keratosis: Secondary | ICD-10-CM | POA: Diagnosis not present

## 2014-03-17 DIAGNOSIS — D2371 Other benign neoplasm of skin of right lower limb, including hip: Secondary | ICD-10-CM | POA: Diagnosis not present

## 2014-03-17 DIAGNOSIS — Z85828 Personal history of other malignant neoplasm of skin: Secondary | ICD-10-CM | POA: Diagnosis not present

## 2014-03-17 DIAGNOSIS — D225 Melanocytic nevi of trunk: Secondary | ICD-10-CM | POA: Diagnosis not present

## 2014-03-17 DIAGNOSIS — D485 Neoplasm of uncertain behavior of skin: Secondary | ICD-10-CM | POA: Diagnosis not present

## 2014-03-17 DIAGNOSIS — L111 Transient acantholytic dermatosis [Grover]: Secondary | ICD-10-CM | POA: Diagnosis not present

## 2014-03-17 DIAGNOSIS — Z85820 Personal history of malignant melanoma of skin: Secondary | ICD-10-CM | POA: Diagnosis not present

## 2014-03-27 ENCOUNTER — Ambulatory Visit (INDEPENDENT_AMBULATORY_CARE_PROVIDER_SITE_OTHER): Payer: Managed Care, Other (non HMO)

## 2014-03-27 ENCOUNTER — Ambulatory Visit (INDEPENDENT_AMBULATORY_CARE_PROVIDER_SITE_OTHER): Payer: Managed Care, Other (non HMO) | Admitting: Emergency Medicine

## 2014-03-27 ENCOUNTER — Other Ambulatory Visit: Payer: Self-pay | Admitting: Emergency Medicine

## 2014-03-27 VITALS — BP 130/90 | HR 95 | Temp 98.3°F | Resp 16 | Ht 74.5 in | Wt 138.0 lb

## 2014-03-27 DIAGNOSIS — Z0000 Encounter for general adult medical examination without abnormal findings: Secondary | ICD-10-CM | POA: Diagnosis not present

## 2014-03-27 DIAGNOSIS — E038 Other specified hypothyroidism: Secondary | ICD-10-CM | POA: Diagnosis not present

## 2014-03-27 DIAGNOSIS — J441 Chronic obstructive pulmonary disease with (acute) exacerbation: Secondary | ICD-10-CM

## 2014-03-27 DIAGNOSIS — R221 Localized swelling, mass and lump, neck: Secondary | ICD-10-CM | POA: Diagnosis not present

## 2014-03-27 DIAGNOSIS — Z8546 Personal history of malignant neoplasm of prostate: Secondary | ICD-10-CM | POA: Diagnosis not present

## 2014-03-27 DIAGNOSIS — C439 Malignant melanoma of skin, unspecified: Secondary | ICD-10-CM

## 2014-03-27 DIAGNOSIS — F32A Depression, unspecified: Secondary | ICD-10-CM

## 2014-03-27 DIAGNOSIS — Z23 Encounter for immunization: Secondary | ICD-10-CM

## 2014-03-27 DIAGNOSIS — E559 Vitamin D deficiency, unspecified: Secondary | ICD-10-CM | POA: Diagnosis not present

## 2014-03-27 DIAGNOSIS — F329 Major depressive disorder, single episode, unspecified: Secondary | ICD-10-CM | POA: Diagnosis not present

## 2014-03-27 DIAGNOSIS — C61 Malignant neoplasm of prostate: Secondary | ICD-10-CM

## 2014-03-27 LAB — POCT CBC
GRANULOCYTE PERCENT: 65.1 % (ref 37–80)
HCT, POC: 48.4 % (ref 43.5–53.7)
HEMOGLOBIN: 16 g/dL (ref 14.1–18.1)
Lymph, poc: 1.9 (ref 0.6–3.4)
MCH, POC: 32.2 pg — AB (ref 27–31.2)
MCHC: 33.1 g/dL (ref 31.8–35.4)
MCV: 97.1 fL — AB (ref 80–97)
MID (cbc): 0.7 (ref 0–0.9)
MPV: 8.3 fL (ref 0–99.8)
POC Granulocyte: 4.9 (ref 2–6.9)
POC LYMPH PERCENT: 25.9 %L (ref 10–50)
POC MID %: 9 %M (ref 0–12)
Platelet Count, POC: 220 10*3/uL (ref 142–424)
RBC: 4.98 M/uL (ref 4.69–6.13)
RDW, POC: 13.1 %
WBC: 7.5 10*3/uL (ref 4.6–10.2)

## 2014-03-27 LAB — COMPREHENSIVE METABOLIC PANEL
ALBUMIN: 4.2 g/dL (ref 3.5–5.2)
ALT: 15 U/L (ref 0–53)
AST: 24 U/L (ref 0–37)
Alkaline Phosphatase: 56 U/L (ref 39–117)
BUN: 11 mg/dL (ref 6–23)
CALCIUM: 9.8 mg/dL (ref 8.4–10.5)
CHLORIDE: 102 meq/L (ref 96–112)
CO2: 28 meq/L (ref 19–32)
Creat: 0.85 mg/dL (ref 0.50–1.35)
GLUCOSE: 95 mg/dL (ref 70–99)
POTASSIUM: 4.6 meq/L (ref 3.5–5.3)
Sodium: 140 mEq/L (ref 135–145)
Total Bilirubin: 2.2 mg/dL — ABNORMAL HIGH (ref 0.2–1.2)
Total Protein: 6.9 g/dL (ref 6.0–8.3)

## 2014-03-27 LAB — LIPID PANEL
CHOLESTEROL: 158 mg/dL (ref 0–200)
HDL: 81 mg/dL (ref >39)
LDL Cholesterol: 60 mg/dL (ref 0–99)
TRIGLYCERIDES: 86 mg/dL (ref <150)
Total CHOL/HDL Ratio: 2 Ratio
VLDL: 17 mg/dL (ref 0–40)

## 2014-03-27 LAB — VITAMIN D 25 HYDROXY (VIT D DEFICIENCY, FRACTURES): Vit D, 25-Hydroxy: 50 ng/mL (ref 30–100)

## 2014-03-27 LAB — T4, FREE: FREE T4: 1.49 ng/dL (ref 0.80–1.80)

## 2014-03-27 MED ORDER — TADALAFIL 5 MG PO TABS: 5.0000 mg | ORAL_TABLET | ORAL | Status: DC | PRN

## 2014-03-27 MED ORDER — MIRTAZAPINE 15 MG PO TABS: 15.0000 mg | ORAL_TABLET | Freq: Every day | ORAL | Status: DC

## 2014-03-27 MED ORDER — VITAMIN D (ERGOCALCIFEROL) 1.25 MG (50000 UNIT) PO CAPS
50000.0000 [IU] | ORAL_CAPSULE | ORAL | Status: DC
Start: 2014-03-27 — End: 2015-04-02

## 2014-03-27 MED ORDER — LEVOTHYROXINE SODIUM 137 MCG PO TABS: 137.0000 ug | ORAL_TABLET | Freq: Every day | ORAL | Status: DC

## 2014-03-27 NOTE — Progress Notes (Signed)
Subjective:  This chart was scribed for Nena Jordan, MD by Dellis Filbert, ED Scribe at [facility].The patient was seen in exam room 03 and the patient's care was started at 8:28 AM.   Patient ID: Tyler Randall, male    DOB: [birth year], 68 y.o.   MRN: [id number] Chief Complaint  Patient presents with  . Medication Refill    vit d, remeron, synthroid,  cialis   HPI  HPI Comments: Tyler Randall is a 68 y.o. male who presents to [facility] here for a medication refill. Pt is overall doing well and is eating well. He sees a dermatologist once a year and is UTD on his colonoscopy, last one was 2 year ago.  He is concerned about his throat. He notes his adams apple is off center. He denies trouble swallowing, lumps or masses and hoarseness. Pt does smoke about a pack a day.  Pt did have prostate cancer in 2003, it was treated with a seed implant. He is seen every 6 months to check his PSA levels.  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  Immunizations: Pt does need a flu shot and Prevnar 13. Immunization History  Administered Date(s) Administered  . Influenza Split 02/07/2012  . Influenza,inj,Quad PF,36+ Mos 02/20/2013   Patient Active Problem List   Diagnosis Date Noted  . Melanoma of skin, site unspecified 10/06/2013  . Unspecified hypothyroidism 10/06/2013  . Unspecified vitamin D deficiency 10/06/2013  . Thyroid condition 11/28/2011   Past Medical History  Diagnosis Date  . Depression   . Allergy   . Cancer    Past Surgical History  Procedure Laterality Date  . Prostate surgery     Allergies  Allergen Reactions  . Latex     With prolonged exposure  . Sulfa Antibiotics     Not sure reaction, started in infancy   Prior to Admission medications   Medication Sig Start Date End Date Taking? Authorizing Provider  Biotin 5000 MCG TABS Take by mouth.   Yes Historical Provider, MD  levothyroxine (SYNTHROID, LEVOTHROID) 137 MCG tablet Take 1 tablet  (137 mcg total) by mouth daily. 02/20/13  Yes Darlyne Russian, MD  mirtazapine (REMERON) 15 MG tablet Take 1 tablet (15 mg total) by mouth at bedtime. 02/20/13  Yes Darlyne Russian, MD  Multiple Vitamins-Minerals (CENTRUM SILVER PO) Take 1 tablet by mouth.   Yes Historical Provider, MD  tadalafil (CIALIS) 5 MG tablet Take 1 tablet (5 mg total) by mouth as needed. 02/07/12  Yes Gay Filler Copland, MD  Vitamin D, Ergocalciferol, (DRISDOL) 50000 UNITS CAPS capsule Take 1 capsule (50,000 Units total) by mouth every 7 (seven) days. 02/20/13  Yes Darlyne Russian, MD  zoster vaccine live, PF, (ZOSTAVAX) 94765 UNT/0.65ML injection Inject 19,400 Units into the skin once. 03/11/13   Darlyne Russian, MD   History   Social History  . Marital Status: Divorced    Spouse Name: N/A    Number of Children: N/A  . Years of Education: N/A   Occupational History  . Not on file.   Social History Main Topics  . Smoking status: Current Every Day Smoker -- 0.50 packs/day  . Smokeless tobacco: Never Used  . Alcohol Use: 12.0 oz/week    20 Cans of beer per week  . Drug Use: Not on file  . Sexual Activity: Not on file   Other Topics Concern  . Not on file   Social  History Narrative   Review of Systems  HENT: Negative for trouble swallowing and voice change.       Objective:  BP 130/90 mmHg  Pulse 95  Temp(Src) 98.3 F (36.8 C) (Oral)  Resp 16  Ht 6' 2.5" (1.892 m)  Wt 138 lb (62.596 kg)  BMI 17.49 kg/m2  SpO2 95%  Physical Exam  Constitutional: He is oriented to person, place, and time. He appears well-developed and well-nourished.  HENT:  Head: Normocephalic and atraumatic.  Eyes: EOM are normal.  Neck: Normal range of motion.  Trachea is slightly deviated to the left but there is no palpable mass.  Cardiovascular: Normal rate.   Pulmonary/Chest: Effort normal.  Musculoskeletal: Normal range of motion.  Neurological: He is alert and oriented to person, place, and time.  Skin: Skin is warm and dry.   Psychiatric: He has a normal mood and affect. His behavior is normal.  Nursing note and vitals reviewed. [facility] reading (PRIMARY) by  Dr.Rayelynn Loyal patient shows signs of COPD no nodules seen     Assessment & Plan:  Patient advised he needs to quit smoking. Medications were refilled routine labs were done recheck 6 months.I personally performed the services described in this documentation, which was scribed in my presence. The recorded information has been reviewed and is accurate.

## 2014-03-30 LAB — BILIRUBIN,DIRECT & INDIRECT (FRACTIONATED)
Bilirubin, Direct: 0.4 mg/dL — ABNORMAL HIGH (ref 0.0–0.3)
Indirect Bilirubin: 1.5 mg/dL — ABNORMAL HIGH (ref 0.2–1.2)

## 2014-04-01 ENCOUNTER — Other Ambulatory Visit: Payer: Self-pay | Admitting: Emergency Medicine

## 2014-05-28 ENCOUNTER — Encounter (HOSPITAL_COMMUNITY): Payer: Self-pay | Admitting: *Deleted

## 2014-05-28 ENCOUNTER — Ambulatory Visit (INDEPENDENT_AMBULATORY_CARE_PROVIDER_SITE_OTHER): Payer: Managed Care, Other (non HMO) | Admitting: Emergency Medicine

## 2014-05-28 ENCOUNTER — Emergency Department (HOSPITAL_COMMUNITY): Payer: Managed Care, Other (non HMO)

## 2014-05-28 ENCOUNTER — Emergency Department (HOSPITAL_COMMUNITY)
Admission: EM | Admit: 2014-05-28 | Discharge: 2014-05-28 | Disposition: A | Discharge status: Home / Self Care | Payer: Managed Care, Other (non HMO) | Attending: Emergency Medicine | Admitting: Emergency Medicine

## 2014-05-28 VITALS — BP 158/82 | HR 104 | Temp 97.9°F | Resp 19 | Ht 74.0 in | Wt 135.0 lb

## 2014-05-28 DIAGNOSIS — I200 Unstable angina: Secondary | ICD-10-CM

## 2014-05-28 DIAGNOSIS — Z720 Tobacco use: Secondary | ICD-10-CM | POA: Insufficient documentation

## 2014-05-28 DIAGNOSIS — Z91040 Latex allergy status: Secondary | ICD-10-CM | POA: Diagnosis not present

## 2014-05-28 DIAGNOSIS — F329 Major depressive disorder, single episode, unspecified: Secondary | ICD-10-CM | POA: Diagnosis not present

## 2014-05-28 DIAGNOSIS — Z859 Personal history of malignant neoplasm, unspecified: Secondary | ICD-10-CM | POA: Insufficient documentation

## 2014-05-28 DIAGNOSIS — Z79899 Other long term (current) drug therapy: Secondary | ICD-10-CM | POA: Diagnosis not present

## 2014-05-28 DIAGNOSIS — R0789 Other chest pain: Secondary | ICD-10-CM | POA: Diagnosis not present

## 2014-05-28 DIAGNOSIS — R079 Chest pain, unspecified: Secondary | ICD-10-CM

## 2014-05-28 DIAGNOSIS — J439 Emphysema, unspecified: Secondary | ICD-10-CM | POA: Diagnosis not present

## 2014-05-28 DIAGNOSIS — E039 Hypothyroidism, unspecified: Secondary | ICD-10-CM | POA: Insufficient documentation

## 2014-05-28 DIAGNOSIS — R072 Precordial pain: Secondary | ICD-10-CM | POA: Diagnosis not present

## 2014-05-28 HISTORY — DX: Disorder of thyroid, unspecified: E07.9

## 2014-05-28 LAB — CBC WITH DIFFERENTIAL/PLATELET
Basophils Absolute: 0 10*3/uL (ref 0.0–0.1)
Basophils Relative: 0 % (ref 0–1)
Eosinophils Absolute: 0.1 10*3/uL (ref 0.0–0.7)
Eosinophils Relative: 1 % (ref 0–5)
HCT: 47.6 % (ref 39.0–52.0)
Hemoglobin: 15.8 g/dL (ref 13.0–17.0)
Lymphocytes Relative: 12 % (ref 12–46)
Lymphs Abs: 1.3 10*3/uL (ref 0.7–4.0)
MCH: 32.2 pg (ref 26.0–34.0)
MCHC: 33.2 g/dL (ref 30.0–36.0)
MCV: 97.1 fL (ref 78.0–100.0)
Monocytes Absolute: 1.5 10*3/uL — ABNORMAL HIGH (ref 0.1–1.0)
Monocytes Relative: 13 % — ABNORMAL HIGH (ref 3–12)
NEUTROS PCT: 74 % (ref 43–77)
Neutro Abs: 8.3 10*3/uL — ABNORMAL HIGH (ref 1.7–7.7)
PLATELETS: 210 10*3/uL (ref 150–400)
RBC: 4.9 MIL/uL (ref 4.22–5.81)
RDW: 13.2 % (ref 11.5–15.5)
WBC: 11.2 10*3/uL — ABNORMAL HIGH (ref 4.0–10.5)

## 2014-05-28 LAB — BASIC METABOLIC PANEL
Anion gap: 9 (ref 5–15)
BUN: 7 mg/dL (ref 6–23)
CHLORIDE: 101 mmol/L (ref 96–112)
CO2: 29 mmol/L (ref 19–32)
CREATININE: 0.83 mg/dL (ref 0.50–1.35)
Calcium: 9.4 mg/dL (ref 8.4–10.5)
GFR calc Af Amer: >90 mL/min (ref >90)
GFR calc non Af Amer: 90 mL/min — ABNORMAL LOW (ref >90)
Glucose, Bld: 109 mg/dL — ABNORMAL HIGH (ref 70–99)
Potassium: 4.3 mmol/L (ref 3.5–5.1)
Sodium: 139 mmol/L (ref 135–145)

## 2014-05-28 LAB — TROPONIN I: Troponin I: <0.03 ng/mL (ref <0.031)

## 2014-05-28 MED ORDER — ASPIRIN 81 MG PO CHEW
324.0000 mg | CHEWABLE_TABLET | Freq: Once | ORAL | Status: AC
  Administered 2014-05-28: 324 mg via ORAL
  Filled 2014-05-28: qty 4

## 2014-05-28 MED ORDER — PANTOPRAZOLE SODIUM 40 MG PO TBEC
40.0000 mg | DELAYED_RELEASE_TABLET | Freq: Once | ORAL | Status: AC
  Administered 2014-05-28: 40 mg via ORAL
  Filled 2014-05-28: qty 1

## 2014-05-28 MED ORDER — OMEPRAZOLE 20 MG PO CPDR: 20.0000 mg | DELAYED_RELEASE_CAPSULE | Freq: Every day | ORAL | Status: DC

## 2014-05-28 NOTE — Progress Notes (Signed)
[facility] [address] [place] [address] [phone]  Date:  05/28/2014   Name:  Tyler Randall   DOB:  07/24/1945   MRN:  [id number]  PCP:  Jenny Reichmann, MD    Chief Complaint: Chest Pain   History of Present Illness:  Tyler Randall is a 69 y.o. very pleasant male patient who presents with the following:  Patient awakened from sleep at 0300 with chest pain.  Says a tight pressure. Waxed and waned over night with most recent pain at 0730 No shortness of breath.  No cough No radiation of pain.   No nausea or vomiting. Risks:  Smokes PPD, age. Pain free currently No improvement with over the counter medications or other home remedies.  Denies other complaint or health concern today.   Patient Active Problem List   Diagnosis Date Noted  . Prostate cancer 03/27/2014  . Melanoma of skin, site unspecified 10/06/2013  . Unspecified hypothyroidism 10/06/2013  . Unspecified vitamin D deficiency 10/06/2013  . Thyroid condition 11/28/2011    Past Medical History  Diagnosis Date  . Depression   . Allergy   . Cancer     Past Surgical History  Procedure Laterality Date  . Prostate surgery      History  Substance Use Topics  . Smoking status: Current Every Day Smoker -- 0.50 packs/day  . Smokeless tobacco: Never Used  . Alcohol Use: 12.0 oz/week    20 Cans of beer per week    History reviewed. No pertinent family history.  Allergies  Allergen Reactions  . Latex     With prolonged exposure  . Sulfa Antibiotics     Not sure reaction, started in infancy    Medication list has been reviewed and updated.  Current Outpatient Prescriptions on File Prior to Visit  Medication Sig Dispense Refill  . Biotin 5000 MCG TABS Take by mouth.    . levothyroxine (SYNTHROID, LEVOTHROID) 137 MCG tablet Take 1 tablet (137 mcg total) by mouth daily. 90 tablet 3  . mirtazapine (REMERON) 15 MG tablet Take 1 tablet (15 mg total) by mouth at bedtime. 90  tablet 3  . Multiple Vitamins-Minerals (CENTRUM SILVER PO) Take 1 tablet by mouth.    . tadalafil (CIALIS) 5 MG tablet Take 1 tablet (5 mg total) by mouth as needed. 15 tablet 11  . Vitamin D, Ergocalciferol, (DRISDOL) 50000 UNITS CAPS capsule Take 1 capsule (50,000 Units total) by mouth every 7 (seven) days. 12 capsule 3  . zoster vaccine live, PF, (ZOSTAVAX) 73220 UNT/0.65ML injection Inject 19,400 Units into the skin once. (Patient not taking: Reported on 05/28/2014) 1 each 0   No current facility-administered medications on file prior to visit.    Review of Systems:  As per HPI, otherwise negative.    Physical Examination: Filed Vitals:   05/28/14 0815  BP: 158/82  Pulse: 104  Temp: 97.9 F (36.6 C)  Resp: 19   Filed Vitals:   05/28/14 0815  Height: 6\' 2"  (1.88 m)  Weight: 135 lb (61.236 kg)   Body mass index is 17.33 kg/(m^2). Ideal Body Weight: Weight in (lb) to have BMI = 25: 194.3  GEN: WDWN, NAD, Non-toxic, A & O x 3 HEENT: Atraumatic, Normocephalic. Neck supple. No masses, No LAD. Ears and Nose: No external deformity. CV: RRR, No M/G/R. No JVD. No thrill. No extra heart sounds. PULM: CTA B, no wheezes, crackles, rhonchi. No retractions. No resp. distress. No accessory muscle  use. ABD: S, NT, ND, +BS. No rebound. No HSM. EXTR: No c/c/e NEURO Normal gait.  PSYCH: Normally interactive. Conversant. Not depressed or anxious appearing.  Calm demeanor.    Assessment and Plan: Unstable angina IV O2 ER via EMS  Signed,  Ellison Carwin, MD

## 2014-05-28 NOTE — ED Notes (Signed)
Pt arrives via [facility] from urgent care. Pt reports waking up at 0300 this morning with substernal squeezing chest pain accompanied with some gas and belching. Pt denies N/V/D, SOB, dizziness, and radiation. Pt is currently chest free and is on 2 L of O2 per [place].

## 2014-05-28 NOTE — ED Provider Notes (Signed)
CSN: [id number]     Arrival date & time 05/28/14  0909 History   First MD Initiated Contact with Patient 05/28/14 [phone]     Chief Complaint  Patient presents with  . Chest Pain     (Consider location/radiation/quality/duration/timing/severity/associated sxs/prior Treatment) Patient is a 69 y.o. male presenting with chest pain.  Chest Pain Pain location:  Substernal area and R chest Pain quality: sharp   Pain radiates to:  Does not radiate Pain severity:  Severe Onset quality:  Sudden Duration:  1 minute Timing:  Intermittent Progression:  Resolved Chronicity:  New Context comment:  Awoke from sleep Relieved by:  Nothing Worsened by:  Nothing tried Associated symptoms: no abdominal pain, no diaphoresis, no dizziness, no fever, no lower extremity edema, no nausea, no shortness of breath and not vomiting   Associated symptoms comment:  Belching   Past Medical History  Diagnosis Date  . Depression   . Allergy   . Cancer   . Thyroid disease     hypothyroidism   Past Surgical History  Procedure Laterality Date  . Prostate surgery     No family history on file. History  Substance Use Topics  . Smoking status: Current Every Day Smoker -- 0.50 packs/day  . Smokeless tobacco: Never Used  . Alcohol Use: 12.0 oz/week    20 Cans of beer per week    Review of Systems  Constitutional: Negative for fever and diaphoresis.  Respiratory: Negative for shortness of breath.   Cardiovascular: Positive for chest pain.  Gastrointestinal: Negative for nausea, vomiting and abdominal pain.  Neurological: Negative for dizziness.  All other systems reviewed and are negative.     Allergies  Latex and Sulfa antibiotics  Home Medications   Prior to Admission medications   Medication Sig Start Date End Date Taking? Authorizing Provider  Biotin 5000 MCG TABS Take by mouth.    Historical Provider, MD  levothyroxine (SYNTHROID, LEVOTHROID) 137 MCG tablet Take 1 tablet (137 mcg total) by  mouth daily. 03/27/14   Darlyne Russian, MD  mirtazapine (REMERON) 15 MG tablet Take 1 tablet (15 mg total) by mouth at bedtime. 03/27/14   Darlyne Russian, MD  Multiple Vitamins-Minerals (CENTRUM SILVER PO) Take 1 tablet by mouth.    Historical Provider, MD  tadalafil (CIALIS) 5 MG tablet Take 1 tablet (5 mg total) by mouth as needed. 03/27/14   Darlyne Russian, MD  Vitamin D, Ergocalciferol, (DRISDOL) 50000 UNITS CAPS capsule Take 1 capsule (50,000 Units total) by mouth every 7 (seven) days. 03/27/14   Darlyne Russian, MD  zoster vaccine live, PF, (ZOSTAVAX) 11914 UNT/0.65ML injection Inject 19,400 Units into the skin once. Patient not taking: Reported on 05/28/2014 03/11/13   Darlyne Russian, MD   BP 158/81 mmHg  Pulse 87  Temp(Src) 98.9 F (37.2 C) (Oral)  Ht 6\' 2"  (1.88 m)  Wt 135 lb (61.236 kg)  BMI 17.33 kg/m2  SpO2 100% Physical Exam  Constitutional: He is oriented to person, place, and time. He appears well-developed and well-nourished. No distress.  HENT:  Head: Normocephalic and atraumatic.  Mouth/Throat: Oropharynx is clear and moist.  Eyes: Conjunctivae are normal. Pupils are equal, round, and reactive to light. No scleral icterus.  Neck: Neck supple.  Cardiovascular: Normal rate, regular rhythm, normal heart sounds and intact distal pulses.   No murmur heard. Pulmonary/Chest: Effort normal and breath sounds normal. No stridor. No respiratory distress. He has no wheezes. He has no rales.  Abdominal: Soft. He  exhibits no distension. There is no tenderness.  Musculoskeletal: Normal range of motion. He exhibits no edema or tenderness.  Neurological: He is alert and oriented to person, place, and time.  Skin: Skin is warm and dry. No rash noted.  Psychiatric: He has a normal mood and affect. His behavior is normal.  Nursing note and vitals reviewed.   ED Course  Procedures (including critical care time) Labs Review Labs Reviewed  CBC WITH DIFFERENTIAL/PLATELET - Abnormal; Notable  for the following:    WBC 11.2 (*)    Neutro Abs 8.3 (*)    Monocytes Relative 13 (*)    Monocytes Absolute 1.5 (*)    All other components within normal limits  BASIC METABOLIC PANEL - Abnormal; Notable for the following:    Glucose, Bld 109 (*)    GFR calc non Af Amer 90 (*)    All other components within normal limits  TROPONIN I  TROPONIN I    Imaging Review Dg Chest 2 View  05/28/2014   CLINICAL DATA:  One day history of right-sided and midportion chest pain  EXAM: CHEST  2 VIEW  COMPARISON:  March 27, 2014  FINDINGS: There is underlying emphysematous change. There is no edema or consolidation. The heart size is normal. The pulmonary vascularity reflects underlying emphysema. No adenopathy. No bone lesions.  IMPRESSION: Underlying emphysema.  No edema or consolidation.   Electronically Signed   By: Lowella Grip III M.D.   On: 05/28/2014 10:21  All radiology studies independently viewed by me.      EKG Interpretation   Date/Time:  Thursday May 28 2014 09:15:18 EST Ventricular Rate:  92 PR Interval:  119 QRS Duration: 96 QT Interval:  383 QTC Calculation: 474 R Axis:   88 Text Interpretation:  Sinus rhythm Borderline short PR interval Borderline  right axis deviation artifact limits interpretation, but no significant  changes appreciated.  Confirmed by [facility]  MD, TREY ([id number]) on 05/28/2014  9:39:07 AM      MDM   Final diagnoses:  Chest pain    Pt had two episodes of severe chest pain.  Other than belching, no associated symptoms.  No pain currently.  Possibly GI related.  However, due to age and smoking history, will need cardiac evaluation.  Symptoms not consistent with PE or dissection.   Two troponins negative.  Remains well appearing.  Appears safe for discharge home.  Advised cardiology and PCP follow up.     Houston Siren III, MD 05/28/14 [phone]

## 2014-05-28 NOTE — ED Notes (Signed)
Pt is stable upon d/c and ambulates escorted by this RN.

## 2014-06-04 ENCOUNTER — Ambulatory Visit (INDEPENDENT_AMBULATORY_CARE_PROVIDER_SITE_OTHER): Payer: Managed Care, Other (non HMO) | Admitting: Emergency Medicine

## 2014-06-04 ENCOUNTER — Encounter: Payer: Self-pay | Admitting: Emergency Medicine

## 2014-06-04 VITALS — BP 134/79 | HR 70 | Temp 98.6°F | Resp 16 | Ht 74.5 in | Wt 140.0 lb

## 2014-06-04 DIAGNOSIS — R1011 Right upper quadrant pain: Secondary | ICD-10-CM

## 2014-06-04 DIAGNOSIS — R079 Chest pain, unspecified: Secondary | ICD-10-CM | POA: Diagnosis not present

## 2014-06-04 DIAGNOSIS — Z23 Encounter for immunization: Secondary | ICD-10-CM

## 2014-06-04 NOTE — Progress Notes (Addendum)
   Subjective:    Patient ID: Tyler Randall, male    DOB: 03/13/1946, 69 y.o.   MRN: [id number] This chart was scribed for Arlyss Queen, MD by Zola Button, Medical Scribe. This patient was seen in room 21 and the patient's care was started at 12:04 PM.    HPI HPI Comments: Tyler Randall is a 69 y.o. male with a hx of melanoma of skin who [facility] follow-up. Patient states he has been doing well. One week ago, patient woke up with a few brief episodes of sharp chest pain followed by episodes of belching/burping. He describes the episodes as feeling like a hiccup, but sounding like a burp. The night before, patient notes that he had a [employer] steak frozen dinner. He was seen by Dr. Ouida Sills, and was then sent to the hospital. Patient does not have any symptoms currently, and he states he has not had any trouble since he was discharged from the hospital. He states he still has his gallbladder. Patient denies lightheadedness and dizziness.  Patient has been divorced since 71.  Review of Systems  Cardiovascular: Negative for chest pain.  Neurological: Negative for dizziness and light-headedness.       Objective:   Physical Exam CONSTITUTIONAL: Well developed/well nourished HEAD: Normocephalic/atraumatic EYES: EOM/PERRL ENMT: Mucous membranes moist NECK: supple no meningeal signs SPINE: entire spine nontender CV: S1/S2 noted, no murmurs/rubs/gallops noted LUNGS: Lungs are clear to auscultation bilaterally, no apparent distress ABDOMEN: soft, nontender, no rebound or guarding GU: no cva tenderness NEURO: Pt is awake/alert, moves all extremitiesx4 EXTREMITIES: pulses normal, full ROM SKIN: warm, color normal PSYCH: no abnormalities of mood noted        Assessment & Plan:  Patient doing well since discharge from the hospital. No further episodes of pain. He is going to see how he does outside of Prilosec as a  trial. Will schedule cardiology evaluation and [place] of abdomen to complete his workup.I personally performed the services described in this documentation, which was scribed in my presence. The recorded information has been reviewed and is accurate.

## 2014-06-04 NOTE — Progress Notes (Deleted)
   Subjective:    Patient ID: Tyler Randall, male    DOB: 1945-04-19, 69 y.o.   MRN: [id number]  HPI    Review of Systems     Objective:   Physical Exam        Assessment & Plan:

## 2014-06-12 ENCOUNTER — Ambulatory Visit
Admission: RE | Admit: 2014-06-12 | Discharge: 2014-06-12 | Disposition: A | Discharge status: Home / Self Care | Payer: Medicare Other | Source: Ambulatory Visit | Attending: Emergency Medicine | Admitting: Emergency Medicine

## 2014-06-12 DIAGNOSIS — C439 Malignant melanoma of skin, unspecified: Secondary | ICD-10-CM | POA: Diagnosis not present

## 2014-06-12 DIAGNOSIS — C61 Malignant neoplasm of prostate: Secondary | ICD-10-CM | POA: Diagnosis not present

## 2014-06-12 DIAGNOSIS — R1011 Right upper quadrant pain: Secondary | ICD-10-CM | POA: Diagnosis not present

## 2014-06-12 DIAGNOSIS — R079 Chest pain, unspecified: Secondary | ICD-10-CM | POA: Diagnosis not present

## 2014-07-13 DIAGNOSIS — C61 Malignant neoplasm of prostate: Secondary | ICD-10-CM | POA: Diagnosis not present

## 2014-07-15 DIAGNOSIS — F17200 Nicotine dependence, unspecified, uncomplicated: Secondary | ICD-10-CM | POA: Diagnosis not present

## 2014-07-15 DIAGNOSIS — R0789 Other chest pain: Secondary | ICD-10-CM | POA: Diagnosis not present

## 2014-08-07 DIAGNOSIS — R0789 Other chest pain: Secondary | ICD-10-CM | POA: Diagnosis not present

## 2015-01-12 ENCOUNTER — Encounter: Payer: Self-pay | Admitting: Emergency Medicine

## 2015-04-02 ENCOUNTER — Other Ambulatory Visit: Payer: Self-pay | Admitting: Emergency Medicine

## 2015-04-02 NOTE — Telephone Encounter (Signed)
The patient req. Refills on levothyroxine (SYNTHROID, LEVOTHROID) 137 MCG tablet, mirtazapine (REMERON) 15 MG tablet, and Vit. D PT's last OV was 06/04/14 advised that he may have to ret. To [facility] for OV prior to fill. Please contact to advise  Pharmacy: [id number]

## 2015-04-05 NOTE — Telephone Encounter (Signed)
Called pt to advise he is overdue for f/up, but I can get him a mos RF of each to cover him until he can get in. Pt agreed to get in and thanked me for the RFs.

## 2015-04-28 ENCOUNTER — Telehealth: Payer: Self-pay | Admitting: Emergency Medicine

## 2015-04-28 ENCOUNTER — Ambulatory Visit (INDEPENDENT_AMBULATORY_CARE_PROVIDER_SITE_OTHER): Payer: Managed Care, Other (non HMO) | Admitting: Emergency Medicine

## 2015-04-28 ENCOUNTER — Other Ambulatory Visit: Payer: Self-pay | Admitting: Physician Assistant

## 2015-04-28 VITALS — BP 130/80 | HR 79 | Temp 97.8°F | Resp 16 | Ht 74.0 in | Wt 143.0 lb

## 2015-04-28 DIAGNOSIS — E038 Other specified hypothyroidism: Secondary | ICD-10-CM | POA: Diagnosis not present

## 2015-04-28 DIAGNOSIS — E559 Vitamin D deficiency, unspecified: Secondary | ICD-10-CM | POA: Diagnosis not present

## 2015-04-28 DIAGNOSIS — K219 Gastro-esophageal reflux disease without esophagitis: Secondary | ICD-10-CM

## 2015-04-28 DIAGNOSIS — Z0000 Encounter for general adult medical examination without abnormal findings: Secondary | ICD-10-CM

## 2015-04-28 LAB — POCT CBC
GRANULOCYTE PERCENT: 60.8 % (ref 37–80)
HEMATOCRIT: 40.6 % — AB (ref 43.5–53.7)
Hemoglobin: 14.2 g/dL (ref 14.1–18.1)
Lymph, poc: 1.3 (ref 0.6–3.4)
MCH, POC: 32.7 pg — AB (ref 27–31.2)
MCHC: 35 g/dL (ref 31.8–35.4)
MCV: 93.3 fL (ref 80–97)
MID (CBC): 0.5 (ref 0–0.9)
MPV: 8.2 fL (ref 0–99.8)
POC GRANULOCYTE: 2.9 (ref 2–6.9)
POC LYMPH %: 27.7 % (ref 10–50)
POC MID %: 11.5 % (ref 0–12)
Platelet Count, POC: 198 10*3/uL (ref 142–424)
RBC: 4.35 M/uL — AB (ref 4.69–6.13)
RDW, POC: 13.1 %
WBC: 4.7 10*3/uL (ref 4.6–10.2)

## 2015-04-28 LAB — COMPLETE METABOLIC PANEL WITH GFR
ALT: 13 U/L (ref 9–46)
AST: 18 U/L (ref 10–35)
Albumin: 4 g/dL (ref 3.6–5.1)
Alkaline Phosphatase: 50 U/L (ref 40–115)
BILIRUBIN TOTAL: 1.3 mg/dL — AB (ref 0.2–1.2)
BUN: 11 mg/dL (ref 7–25)
CALCIUM: 9.5 mg/dL (ref 8.6–10.3)
CO2: 30 mmol/L (ref 20–31)
Chloride: 105 mmol/L (ref 98–110)
Creat: 0.79 mg/dL (ref 0.70–1.25)
GFR, Est Non African American: >89 mL/min (ref >=60)
Glucose, Bld: 92 mg/dL (ref 65–99)
Potassium: 4.9 mmol/L (ref 3.5–5.3)
Sodium: 140 mmol/L (ref 135–146)
TOTAL PROTEIN: 6.3 g/dL (ref 6.1–8.1)

## 2015-04-28 LAB — VITAMIN B12: Vitamin B-12: 679 pg/mL (ref 211–911)

## 2015-04-28 LAB — MAGNESIUM: Magnesium: 1.8 mg/dL (ref 1.5–2.5)

## 2015-04-28 LAB — FERRITIN: Ferritin: 295 ng/mL (ref 22–322)

## 2015-04-28 LAB — TSH: TSH: 1.01 u[IU]/mL (ref 0.350–4.500)

## 2015-04-28 MED ORDER — LEVOTHYROXINE SODIUM 137 MCG PO TABS: 137.0000 ug | ORAL_TABLET | Freq: Every day | ORAL | Status: DC

## 2015-04-28 MED ORDER — VITAMIN D (ERGOCALCIFEROL) 1.25 MG (50000 UNIT) PO CAPS: ORAL_CAPSULE | ORAL | Status: DC

## 2015-04-28 MED ORDER — MIRTAZAPINE 15 MG PO TABS: ORAL_TABLET | ORAL | Status: DC

## 2015-04-28 MED ORDER — ZOSTER VACCINE LIVE 19400 UNT/0.65ML ~~LOC~~ SOLR: 0.6500 mL | Freq: Once | SUBCUTANEOUS | Status: DC

## 2015-04-28 NOTE — Progress Notes (Signed)
By signing my name below, I, Raven Small, attest that this documentation has been prepared under the direction and in the presence of Arlyss Queen, MD.  Electronically Signed: Thea Alken, ED Scribe. 04/28/2015. 8:59 AM.  Chief Complaint:  Chief Complaint  Patient presents with  . Medication Refill    Drisdol, synthroid, remeron    HPI: Tyler Randall is a 70 y.o. male who reports to [facility] today for a medication refill regarding drisdol, synthroid, and Remeron. Pt is doing well. No complaints. He is tolerating medication well without adverse effects. Pt denies CP, abdominal pain and decreased appetite.  Pt is seen by dermatology once a year.  He has a hx of prostate cancer. He is  followed by [facility] and is seen once a year by Dr. Risa Grill  His last colonoscopy was 02/2013 and is to repeat in 02/2018 Flu shot: 01/2015.    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 He has been there for plans to retire at 70 y.o.   Past Medical History  Diagnosis Date  . Depression   . Allergy   . Cancer ([place])   . Thyroid disease     hypothyroidism   Past Surgical History  Procedure Laterality Date  . Prostate surgery     Social History   Social History  . Marital Status: Divorced    Spouse Name: N/A  . Number of Children: N/A  . Years of Education: N/A   Social History Main Topics  . Smoking status: Current Every Day Smoker -- 0.50 packs/day  . Smokeless tobacco: Never Used  . Alcohol Use: 12.0 oz/week    20 Cans of beer per week  . Drug Use: None  . Sexual Activity: Not Asked   Other Topics Concern  . None   Social History Narrative   History reviewed. No pertinent family history. Allergies  Allergen Reactions  . Latex     With prolonged exposure  . Sulfa Antibiotics     Not sure reaction, started in infancy   Prior to Admission medications   Medication Sig Start Date End Date Taking? Authorizing Provider  Biotin 5000 MCG TABS  Take by mouth.   Yes Historical Provider, MD  levothyroxine (SYNTHROID, LEVOTHROID) 137 MCG tablet TAKE 1 TABLET (137 MCG TOTAL) BY MOUTH DAILY. 04/05/15  Yes Darlyne Russian, MD  mirtazapine (REMERON) 15 MG tablet TAKE 1 TABLET (15 MG TOTAL) BY MOUTH AT BEDTIME. 04/05/15  Yes Darlyne Russian, MD  Multiple Vitamins-Minerals (CENTRUM SILVER PO) Take 1 tablet by mouth.   Yes Historical Provider, MD  tadalafil (CIALIS) 5 MG tablet Take 1 tablet (5 mg total) by mouth as needed. 03/27/14  Yes Darlyne Russian, MD  Vitamin D, Ergocalciferol, (DRISDOL) 50000 UNITS CAPS capsule TAKE 1 CAPSULE (50,000 UNITS TOTAL) BY MOUTH EVERY 7 (SEVEN) DAYS. 04/05/15  Yes Darlyne Russian, MD  aspirin EC 81 MG tablet Take 81 mg by mouth daily. Reported on 04/28/2015    Historical Provider, MD  omeprazole (PRILOSEC) 20 MG capsule Take 1 capsule (20 mg total) by mouth daily. Patient not taking: Reported on 04/28/2015 05/28/14   Serita Grit, MD  zoster vaccine live, PF, (ZOSTAVAX) 91478 UNT/0.65ML injection Inject 19,400 Units into the skin once. Patient not taking: Reported on 05/28/2014 03/11/13   Darlyne Russian, MD     ROS: The patient denies fevers, chills, night sweats, unintentional weight loss, chest pain, palpitations, wheezing,  dyspnea on exertion, nausea, vomiting, abdominal pain, dysuria, hematuria, melena, numbness, weakness, or tingling.  All other systems have been reviewed and were otherwise negative with the exception of those mentioned in the HPI and as above.    PHYSICAL EXAM: Filed Vitals:   04/28/15 0841  BP: 130/80  Pulse: 79  Temp: 97.8 F (36.6 C)  Resp: 16  BP recheck: 130/76  Body mass index is 18.35 kg/(m^2).   General: Alert, no acute distress HEENT:  Normocephalic, atraumatic, oropharynx patent. Eye: Juliette Mangle [facility] Cardiovascular:  Regular rate and rhythm, no rubs murmurs or gallops.  No Carotid bruits, radial pulse intact. No pedal edema.  Respiratory: Clear to auscultation bilaterally.  No  wheezes, rales, or rhonchi.  No cyanosis, no use of accessory musculature Abdominal: No organomegaly, abdomen is soft and non-tender, positive bowel sounds.  No masses. Musculoskeletal: Gait intact. No edema, tenderness Skin: No rashes. Multiple nevi over his trunk.  Neurologic: Facial musculature symmetric. Psychiatric: Patient acts appropriately throughout our interaction. Lymphatic: No cervical or submandibular lymphadenopathy  LABS: Results for orders placed or performed in visit on 04/28/15  POCT CBC  Result Value Ref Range   WBC 4.7 4.6 - 10.2 K/uL   Lymph, poc 1.3 0.6 - 3.4   POC LYMPH PERCENT 27.7 10 - 50 %L   MID (cbc) 0.5 0 - 0.9   POC MID % 11.5 0 - 12 %M   POC Granulocyte 2.9 2 - 6.9   Granulocyte percent 60.8 37 - 80 %G   RBC 4.35 (A) 4.69 - 6.13 M/uL   Hemoglobin 14.2 14.1 - 18.1 g/dL   HCT, POC 40.6 (A) 43.5 - 53.7 %   MCV 93.3 80 - 97 fL   MCH, POC 32.7 (A) 27 - 31.2 pg   MCHC 35.0 31.8 - 35.4 g/dL   RDW, POC 13.1 %   Platelet Count, POC 198 142 - 424 K/uL   MPV 8.2 0 - 99.8 fL   ASSESSMENT/PLAN:  meds refilled. Patient doing well. We'll plan to see him prior to me leaving.I personally performed the services described in this documentation, which was scribed in my presence. The recorded information has been reviewed and is accurate.   Gross sideeffects, risk and benefits, and alternatives of medications d/w patient. Patient is aware that all medications have potential sideeffects and we are unable to predict every sideeffect or drug-drug interaction that may occur.  Arlyss Queen MD 04/28/2015 8:59 AM

## 2015-04-28 NOTE — Progress Notes (Unsigned)
Per patient's request moving his meds to a more convinient refill regimen.  Philis Fendt, MS, PA-C 4:11 PM, 04/28/2015

## 2015-04-28 NOTE — Telephone Encounter (Signed)
Mes refilled per patient's request.  Please make him aware.  Philis Fendt, MS, PA-C 4:11 PM, 04/28/2015

## 2015-04-28 NOTE — Telephone Encounter (Signed)
Pt called to 104 today and stated that he was seen by Dr. Everlene Farrier this morning at 102.  He refilled 3 of his medications but these medications were sent in for 30 day supply and the pt normally gets 90 day supply.  He would like [facility] [address]  Medications that will need refills are:  Levothyroxine, vitamin d, mirtazapine.  Will forward to Philis Fendt, per his request

## 2015-04-28 NOTE — Telephone Encounter (Signed)
Called and spoke with pt and he is aware of medications sent to the pharmacy for 90 day supply.

## 2015-04-29 LAB — VITAMIN D 25 HYDROXY (VIT D DEFICIENCY, FRACTURES): VIT D 25 HYDROXY: 58 ng/mL (ref 30–100)

## 2015-05-02 ENCOUNTER — Other Ambulatory Visit: Payer: Self-pay | Admitting: Emergency Medicine

## 2015-07-01 ENCOUNTER — Telehealth: Payer: Self-pay | Admitting: Family Medicine

## 2015-07-01 ENCOUNTER — Telehealth: Payer: Self-pay

## 2015-07-01 NOTE — Telephone Encounter (Signed)
Left a message for patient to return call to see if he has fallen in the past year.

## 2015-07-01 NOTE — Telephone Encounter (Signed)
[name] called back to inform Tyler Randall that he has not fallen in the past year, or the year before that, or the year before that. He wanted to know why he was being asked that question.

## 2015-08-13 DIAGNOSIS — Z8546 Personal history of malignant neoplasm of prostate: Secondary | ICD-10-CM | POA: Diagnosis not present

## 2015-12-20 DIAGNOSIS — C61 Malignant neoplasm of prostate: Secondary | ICD-10-CM | POA: Diagnosis not present

## 2015-12-20 DIAGNOSIS — N5201 Erectile dysfunction due to arterial insufficiency: Secondary | ICD-10-CM | POA: Diagnosis not present

## 2016-05-02 ENCOUNTER — Ambulatory Visit: Payer: Managed Care, Other (non HMO)

## 2016-05-05 ENCOUNTER — Ambulatory Visit (INDEPENDENT_AMBULATORY_CARE_PROVIDER_SITE_OTHER): Payer: Managed Care, Other (non HMO) | Admitting: Family Medicine

## 2016-05-05 VITALS — BP 140/80 | HR 102 | Temp 98.2°F | Resp 16 | Ht 74.0 in | Wt 145.4 lb

## 2016-05-05 DIAGNOSIS — E039 Hypothyroidism, unspecified: Secondary | ICD-10-CM | POA: Diagnosis not present

## 2016-05-05 DIAGNOSIS — F329 Major depressive disorder, single episode, unspecified: Secondary | ICD-10-CM

## 2016-05-05 DIAGNOSIS — R7989 Other specified abnormal findings of blood chemistry: Secondary | ICD-10-CM | POA: Diagnosis not present

## 2016-05-05 DIAGNOSIS — F32A Depression, unspecified: Secondary | ICD-10-CM

## 2016-05-05 MED ORDER — LEVOTHYROXINE SODIUM 137 MCG PO TABS: 137.0000 ug | ORAL_TABLET | Freq: Every day | ORAL | 3 refills | Status: DC

## 2016-05-05 MED ORDER — MIRTAZAPINE 15 MG PO TABS: ORAL_TABLET | ORAL | 3 refills | Status: DC

## 2016-05-05 NOTE — Patient Instructions (Addendum)
No change in doses of thyroid medicine at this time. Continue Remeron at same dose. I will check a vitamin D level, and if still this high of a level as in the past, you may not need the prescription vitamin D. Over-the-counter vitamin D anywhere from [phone] units per day should be sufficient if you are still at a high level. If we do switch to just over-the-counter vitamin D, you can return in 6 weeks to recheck vitamin D levels to make sure that is sufficient. Schedule a physical with me in the next 3-6 months.    IF you received an x-ray today, you will receive an invoice from [facility]. Please contact [facility] at [phone] with questions or concerns regarding your invoice.   IF you received labwork today, you will receive an invoice from [place]. Please contact [facility] at [phone] with questions or concerns regarding your invoice.   Our billing staff will not be able to assist you with questions regarding bills from these companies.  You will be contacted with the lab results as soon as they are available. The fastest way to get your results is to activate your My Chart account. Instructions are located on the last page of this paperwork. If you have not heard from [place] regarding the results in 2 weeks, please contact this office.

## 2016-05-05 NOTE — Progress Notes (Signed)
Subjective:  By signing my name below, I, Raven Small, attest that this documentation has been prepared under the direction and in the presence of Merri Ray, MD.  Electronically Signed: Thea Alken, ED Scribe. 05/05/2016. 8:02 AM.   Patient ID: Tyler Randall, male    DOB: Jan 08, 1946, 71 y.o.   MRN: [id number]  HPI Chief Complaint  Patient presents with  . Medication Refill    Levothyroxine, Mirtazapine and Vitamin D    HPI Comments: Tyler Randall is a 71 y.o. male who presents to the Primary Care at [facility] for a medication refill. Previous pt of Dr. Everlene Farrier. Hx of hypothyroidism, prostate cancer and low vitamin D.   Prostate cancer Urologist is Dr. Risa Grill at [facility] u[facility]. He is seen ever 6 months.  Had seed implants in 2003.   Hx of depression.  Takes Remeron 15 mg at bed time. Pt takes this medication for depression, but also helps with sleep. He denies change in appetite.   Hypothyroidism  Lab Results  Component Value Date   TSH 1.010 04/28/2015   He's on synthroid. Pt denies hair changes, skin changes, palpitations.   Low vitamin D Most recent level of 58 1 year ago. He has been treated with 50,00 units per week. Pt also takes centrum silver.  Immunizations Pt received flu shot this season.   Pt quit smoking 3 months ago.    Patient Active Problem List   Diagnosis Date Noted  . Prostate cancer ([place]) 03/27/2014  . Melanoma of skin, site unspecified 10/06/2013  . Unspecified hypothyroidism 10/06/2013  . Unspecified vitamin D deficiency 10/06/2013  . Thyroid condition 11/28/2011   Past Medical History:  Diagnosis Date  . Allergy   . Cancer ([place])   . Depression   . Thyroid disease    hypothyroidism   Past Surgical History:  Procedure Laterality Date  . PROSTATE SURGERY     Allergies  Allergen Reactions  . Latex     With prolonged exposure  . Sulfa Antibiotics     Not sure reaction, started in infancy   Prior to Admission medications     Medication Sig Start Date End Date Taking? Authorizing Provider  levothyroxine (SYNTHROID, LEVOTHROID) 137 MCG tablet Take 1 tablet (137 mcg total) by mouth daily before breakfast. 04/28/15  Yes Tereasa Coop, PA-C  mirtazapine (REMERON) 15 MG tablet TAKE 1 TABLET (15 MG TOTAL) BY MOUTH AT BEDTIME. 04/28/15  Yes Tereasa Coop, PA-C  Multiple Vitamins-Minerals (CENTRUM SILVER PO) Take 1 tablet by mouth.   Yes Historical Provider, MD  tadalafil (CIALIS) 5 MG tablet Take 1 tablet (5 mg total) by mouth as needed. 03/27/14  Yes Darlyne Russian, MD  Vitamin D, Ergocalciferol, (DRISDOL) 50000 units CAPS capsule TAKE 1 CAPSULE (50,000 UNITS TOTAL) BY MOUTH EVERY 7 (SEVEN) DAYS. 04/28/15  Yes Tereasa Coop, PA-C  Biotin 5000 MCG TABS Take by mouth.    Historical Provider, MD  omeprazole (PRILOSEC) 20 MG capsule Take 1 capsule (20 mg total) by mouth daily. Patient not taking: Reported on 04/28/2015 05/28/14   Serita Grit, MD   Social History   Social History  . Marital status: Divorced    Spouse name: N/A  . Number of children: N/A  . Years of education: N/A   Occupational History  . Not on file.   Social History Main Topics  . Smoking status: Former Smoker    Packs/day: 0.50  . Smokeless tobacco: Never Used  . Alcohol use 12.0  oz/week    20 Cans of beer per week  . Drug use: Unknown  . Sexual activity: Not on file   Other Topics Concern  . Not on file   Social History Narrative  . No narrative on file   Review of Systems  Constitutional: Negative for appetite change, chills and diaphoresis.  Cardiovascular: Negative for palpitations.  Endocrine: Negative for cold intolerance and heat intolerance.       Objective:   Physical Exam  Constitutional: He is oriented to person, place, and time. He appears well-developed and well-nourished.  HENT:  Head: Normocephalic and atraumatic.  Eyes: EOM are normal. Pupils are equal, round, and reactive to light.  Neck: No JVD present.  Carotid bruit is not present. No thyroid mass and no thyromegaly present.  Cardiovascular: Normal rate, regular rhythm and normal heart sounds.   No murmur heard. Pulmonary/Chest: Effort normal and breath sounds normal. He has no rales.  Musculoskeletal: He exhibits no edema.  Neurological: He is alert and oriented to person, place, and time.  Skin: Skin is warm and dry.  Psychiatric: He has a normal mood and affect.  Vitals reviewed.   Vitals:   05/05/16 0755  BP: 140/80  Pulse: (!) 102  Resp: 16  Temp: 98.2 F (36.8 C)  TempSrc: Oral  SpO2: 99%  Weight: 145 lb 6.4 oz (66 kg)  Height: 6\' 2"  (1.88 m)   Assessment & Plan:   Tyler Randall is a 71 y.o. male Hypothyroidism, unspecified type - Plan: TSH, levothyroxine (SYNTHROID, LEVOTHROID) 137 MCG tablet, Care order/instruction:  - Stable . Check TSH, continue same dose Synthroid.  Low serum vitamin D - Plan: Vitamin D, 25-hydroxy  - Based on prior readings may not need prescription strength. If still at a good level of at least 30-40, can try over-the-counter vitamin D [phone] units per day with repeat levels in 6 weeks.  Depression, unspecified depression type - Plan: mirtazapine (REMERON) 15 MG tablet  - Stable, continue Remeron.  Continue follow-up with urologist, and schedule physical in the next 6 months with me.  Meds ordered this encounter  Medications  . mirtazapine (REMERON) 15 MG tablet    Sig: TAKE 1 TABLET (15 MG TOTAL) BY MOUTH AT BEDTIME.    Dispense:  90 tablet    Refill:  3  . levothyroxine (SYNTHROID, LEVOTHROID) 137 MCG tablet    Sig: Take 1 tablet (137 mcg total) by mouth daily before breakfast.    Dispense:  90 tablet    Refill:  3   Patient Instructions   No change in doses of thyroid medicine at this time. Continue Remeron at same dose. I will check a vitamin D level, and if still this high of a level as in the past, you may not need the prescription vitamin D. Over-the-counter vitamin D anywhere  from [phone] units per day should be sufficient if you are still at a high level. If we do switch to just over-the-counter vitamin D, you can return in 6 weeks to recheck vitamin D levels to make sure that is sufficient. Schedule a physical with me in the next 3-6 months.    IF you received an x-ray today, you will receive an invoice from [facility]. Please contact [facility] at [phone] with questions or concerns regarding your invoice.   IF you received labwork today, you will receive an invoice from [place]. Please contact [facility] at [phone] with questions or concerns regarding your invoice.   Our billing  staff will not be able to assist you with questions regarding bills from these companies.  You will be contacted with the lab results as soon as they are available. The fastest way to get your results is to activate your My Chart account. Instructions are located on the last page of this paperwork. If you have not heard from [place] regarding the results in 2 weeks, please contact this office.       I personally performed the services described in this documentation, which was scribed in my presence. The recorded information has been reviewed and considered, and addended by me as needed.   Signed,   Merri Ray, MD [facility] at [place].  05/05/16 8:33 AM

## 2016-05-06 LAB — VITAMIN D 25 HYDROXY (VIT D DEFICIENCY, FRACTURES): Vit D, 25-Hydroxy: 62.1 ng/mL (ref 30.0–100.0)

## 2016-05-06 LAB — TSH: TSH: 1.35 u[IU]/mL (ref 0.450–4.500)

## 2016-05-15 ENCOUNTER — Encounter: Payer: Self-pay | Admitting: *Deleted

## 2016-07-03 DIAGNOSIS — N5201 Erectile dysfunction due to arterial insufficiency: Secondary | ICD-10-CM | POA: Diagnosis not present

## 2016-07-03 DIAGNOSIS — Z8546 Personal history of malignant neoplasm of prostate: Secondary | ICD-10-CM | POA: Diagnosis not present

## 2016-10-03 ENCOUNTER — Ambulatory Visit (INDEPENDENT_AMBULATORY_CARE_PROVIDER_SITE_OTHER): Payer: 59

## 2016-10-03 ENCOUNTER — Ambulatory Visit (INDEPENDENT_AMBULATORY_CARE_PROVIDER_SITE_OTHER): Payer: 59 | Admitting: Physician Assistant

## 2016-10-03 ENCOUNTER — Encounter: Payer: Self-pay | Admitting: Physician Assistant

## 2016-10-03 VITALS — BP 165/84 | HR 68 | Temp 97.5°F | Resp 18 | Ht 74.5 in | Wt 147.0 lb

## 2016-10-03 DIAGNOSIS — M79672 Pain in left foot: Secondary | ICD-10-CM

## 2016-10-03 NOTE — Progress Notes (Signed)
Tyler Randall  MRN: [id number] DOB: October 10, 1945  PCP: Darlyne Russian, MD  Subjective:  Pt is a 71 year old male who presents to clinic for left foot pain x 2 months. His pain started on the outside of his left foot, but soon presented proximally to his 3rd toe. Endorses 1-2 episodes of swelling - this has completely resolved. C/o constant nagging pain in the area. Denies MOI.  He is on his feet all the time, wears sturdy work boots. Has to lift 50lbs during his workday.  Denies reduced range of motion, weakness, redness, bruising, fever, chills. He has been using heat. Has been using a cane, this is causing pain on his right side. He is not taking anytihng to feel better.   Review of Systems  Constitutional: Negative for chills and fever.  Musculoskeletal: Positive for arthralgias and gait problem.  Skin: Negative.   Psychiatric/Behavioral: Negative for sleep disturbance.    Patient Active Problem List   Diagnosis Date Noted  . Prostate cancer ([place]) 03/27/2014  . Melanoma of skin, site unspecified 10/06/2013  . Unspecified hypothyroidism 10/06/2013  . Unspecified vitamin D deficiency 10/06/2013  . Thyroid condition 11/28/2011    Current Outpatient Prescriptions on File Prior to Visit  Medication Sig Dispense Refill  . Biotin 5000 MCG TABS Take by mouth.    . levothyroxine (SYNTHROID, LEVOTHROID) 137 MCG tablet Take 1 tablet (137 mcg total) by mouth daily before breakfast. 90 tablet 3  . mirtazapine (REMERON) 15 MG tablet TAKE 1 TABLET (15 MG TOTAL) BY MOUTH AT BEDTIME. 90 tablet 3  . Multiple Vitamins-Minerals (CENTRUM SILVER PO) Take 1 tablet by mouth.    . tadalafil (CIALIS) 5 MG tablet Take 1 tablet (5 mg total) by mouth as needed. 15 tablet 11  . Vitamin D, Ergocalciferol, (DRISDOL) 50000 units CAPS capsule TAKE 1 CAPSULE (50,000 UNITS TOTAL) BY MOUTH EVERY 7 (SEVEN) DAYS. 48 capsule 0   No current facility-administered medications on file prior to visit.     Allergies    Allergen Reactions  . Latex     With prolonged exposure  . Sulfa Antibiotics     Not sure reaction, started in infancy     Objective:  BP (!) 165/84   Pulse 68   Temp 97.5 F (36.4 C) (Oral)   Resp 18   Ht 6' 2.5" (1.892 m)   Wt 147 lb (66.7 kg)   SpO2 98%   BMI 18.62 kg/m   Physical Exam  Constitutional: He is oriented to person, place, and time and well-developed, well-nourished, and in no distress. No distress.  Cardiovascular: Normal rate, regular rhythm, normal heart sounds, intact distal pulses and normal pulses.   Musculoskeletal:       Left foot: There is bony tenderness (distal 3rd metatarsal). There is normal range of motion, no swelling, normal capillary refill, no crepitus and no deformity.  Neurological: He is alert and oriented to person, place, and time. GCS score is 15.  Skin: Skin is warm and dry.  Psychiatric: Mood, memory, affect and judgment normal.  Vitals reviewed.  Dg Foot Complete Left  Result Date: 10/03/2016 CLINICAL DATA:  Pain at the distal third metatarsal for 2 months. EXAM: LEFT FOOT - COMPLETE 3+ VIEW COMPARISON:  None. FINDINGS: There is a slight hallux valgus deformity with bunion formation on the head of the first metatarsal. The other bones of the left foot appear normal. Specifically, the third metatarsal and the third MTP joint appear normal. IMPRESSION:  No acute abnormality. Degenerative changes at the first MTP joint. No discrete abnormality in the region of the third metatarsal. Electronically Signed   By: Lorriane Shire M.D.   On: 10/03/2016 10:03    Assessment and Plan :  1. Left foot pain - DG Foot Complete left; Future - X-ray negative. Will place in post-op shoe x 3 weeks, work restrictions written for lifting <25 lbs. RTC in 3 weeks for recheck. Advised heat/ice for pain, elevation if swelling. He does not want any medications for pain.   Mercer Pod, PA-C  [facility] [place] 10/03/2016 9:42  AM

## 2016-10-03 NOTE — Patient Instructions (Addendum)
Your x-ray shows No acute abnormality. No discrete abnormality in the region of the third metatarsal. Use ice and/or heat as needed for pain. Elevate your foot if there is swelling.  Come back and see me in 3 weeks.   Thank you for coming in today. I hope you feel we met your needs.  Feel free to call [facility] if you have any questions or further requests.  Please consider signing up for MyChart if you do not already have it, as this is a great way to communicate with me.  Best,  Whitney McVey, PA-C   IF you received an x-ray today, you will receive an invoice from [facility]. Please contact [facility] at [phone] with questions or concerns regarding your invoice.   IF you received labwork today, you will receive an invoice from [place]. Please contact [facility] at [phone] with questions or concerns regarding your invoice.   Our billing staff will not be able to assist you with questions regarding bills from these companies.  You will be contacted with the lab results as soon as they are available. The fastest way to get your results is to activate your My Chart account. Instructions are located on the last page of this paperwork. If you have not heard from [place] regarding the results in 2 weeks, please contact this office.

## 2016-10-25 ENCOUNTER — Encounter: Payer: Self-pay | Admitting: Physician Assistant

## 2016-10-25 ENCOUNTER — Ambulatory Visit (INDEPENDENT_AMBULATORY_CARE_PROVIDER_SITE_OTHER): Payer: 59 | Admitting: Physician Assistant

## 2016-10-25 VITALS — BP 144/80 | HR 85 | Temp 98.1°F | Resp 16 | Ht 74.0 in | Wt 143.8 lb

## 2016-10-25 DIAGNOSIS — M7742 Metatarsalgia, left foot: Secondary | ICD-10-CM

## 2016-10-25 DIAGNOSIS — M79672 Pain in left foot: Secondary | ICD-10-CM

## 2016-10-25 NOTE — Progress Notes (Signed)
   Tyler Randall  MRN: [id number] DOB: 1945/08/28  PCP: [name], MD  Subjective:  Pt is a 71 year old male who presents to clinic for f/u foot pain. This has been a problem for him x >2 months.   Last OV 6/26 Plan: "Will place in post-op shoe x 3 weeks, work restrictions written for lifting <25 lbs. RTC in 3 weeks for recheck. Advised heat/ice for pain, elevation if swelling" He is better today, however not completely. He did not notice major improvement with post-op shoe. Still experiencing soreness along 3rd metatarsal. He has tried orthotics he bought at a pharmacy a few years ago - did not help.   Review of Systems  Musculoskeletal: Positive for arthralgias (left foot) and gait problem. Negative for joint swelling.  Skin: Negative.     Patient Active Problem List   Diagnosis Date Noted  . Prostate cancer ([place]) 03/27/2014  . Melanoma of skin, site unspecified 10/06/2013  . Unspecified hypothyroidism 10/06/2013  . Unspecified vitamin D deficiency 10/06/2013  . Thyroid condition 11/28/2011    Current Outpatient Prescriptions on File Prior to Visit  Medication Sig Dispense Refill  . Biotin 5000 MCG TABS Take by mouth.    . levothyroxine (SYNTHROID, LEVOTHROID) 137 MCG tablet Take 1 tablet (137 mcg total) by mouth daily before breakfast. 90 tablet 3  . mirtazapine (REMERON) 15 MG tablet TAKE 1 TABLET (15 MG TOTAL) BY MOUTH AT BEDTIME. 90 tablet 3  . Multiple Vitamins-Minerals (CENTRUM SILVER PO) Take 1 tablet by mouth.    . tadalafil (CIALIS) 5 MG tablet Take 1 tablet (5 mg total) by mouth as needed. 15 tablet 11  . Vitamin D, Ergocalciferol, (DRISDOL) 50000 units CAPS capsule TAKE 1 CAPSULE (50,000 UNITS TOTAL) BY MOUTH EVERY 7 (SEVEN) DAYS. 48 capsule 0   No current facility-administered medications on file prior to visit.     Allergies  Allergen Reactions  . Latex     With prolonged exposure  . Sulfa Antibiotics     Not sure reaction, started in infancy       Objective:  BP (!) 160/84   Pulse 85   Temp 98.1 F (36.7 C) (Oral)   Resp 16   Ht 6\' 2"  (1.88 m)   Wt 143 lb 12.8 oz (65.2 kg)   SpO2 97%   BMI 18.46 kg/m   Physical Exam  Constitutional: He is oriented to person, place, and time and well-developed, well-nourished, and in no distress. No distress.  Cardiovascular: Normal rate, regular rhythm and normal heart sounds.   Musculoskeletal:       Left foot: There is bony tenderness (mild. distal 3rd metatarsal). There is normal range of motion, no tenderness, no swelling, no crepitus and no deformity.  Neurological: He is alert and oriented to person, place, and time. GCS score is 15.  Skin: Skin is warm and dry.  Psychiatric: Mood, memory, affect and judgment normal.  Vitals reviewed.   Assessment and Plan :  1. Metatarsalgia of left foot 2. Foot pain, left - Pt is improving, however he is not 100% better.  Thera-band given to pt and strengthening exercises demonstrated. Advised pt pt go to [facility] and be fitted for proper orthotics. Heat/ice, and Ibuprofen and/or Tylenol for pain. He is to call if he decides he'd like referral to sports medicine. He agrees with plan.    Mercer Pod, PA-C  [facility] 10/25/2016 8:08 AM

## 2016-10-25 NOTE — Patient Instructions (Addendum)
Use your thera-band at least once a day for the next 2-3 weeks. Use ice and/or heat for your foot pain. Ibuprofen and/or Tylenol for pain.  Go to the [facility] [address] [place] [phone] for proper orthotics.  If you decide you'd like a referral to sports medicine call PCP and leave a message for me. I will put in the referral.   Thank you for coming in today. I hope you feel we met your needs.  Feel free to call [facility] if you have any questions or further requests.  Please consider signing up for MyChart if you do not already have it, as this is a great way to communicate with me.  Best,  Whitney McVey, PA-C   IF you received an x-ray today, you will receive an invoice from [facility]. Please contact [facility] at [phone] with questions or concerns regarding your invoice.   IF you received labwork today, you will receive an invoice from [facility]. Please contact [facility] at [phone] with questions or concerns regarding your invoice.   Our billing staff will not be able to assist you with questions regarding bills from these companies.  You will be contacted with the lab results as soon as they are available. The fastest way to get your results is to activate your My Chart account. Instructions are located on the last page of this paperwork. If you have not heard from us regarding the results in 2 weeks, please contact this office.     

## 2016-11-09 ENCOUNTER — Ambulatory Visit (INDEPENDENT_AMBULATORY_CARE_PROVIDER_SITE_OTHER): Payer: 59 | Admitting: Family Medicine

## 2016-11-09 ENCOUNTER — Telehealth: Payer: Self-pay

## 2016-11-09 ENCOUNTER — Encounter: Payer: Self-pay | Admitting: Family Medicine

## 2016-11-09 VITALS — BP 151/83 | HR 90 | Temp 97.8°F | Resp 17 | Ht 73.82 in | Wt 139.4 lb

## 2016-11-09 DIAGNOSIS — Z23 Encounter for immunization: Secondary | ICD-10-CM

## 2016-11-09 DIAGNOSIS — Z13220 Encounter for screening for lipoid disorders: Secondary | ICD-10-CM

## 2016-11-09 DIAGNOSIS — F32A Depression, unspecified: Secondary | ICD-10-CM

## 2016-11-09 DIAGNOSIS — Z0000 Encounter for general adult medical examination without abnormal findings: Secondary | ICD-10-CM | POA: Diagnosis not present

## 2016-11-09 DIAGNOSIS — E559 Vitamin D deficiency, unspecified: Secondary | ICD-10-CM | POA: Diagnosis not present

## 2016-11-09 DIAGNOSIS — N410 Acute prostatitis: Secondary | ICD-10-CM | POA: Diagnosis not present

## 2016-11-09 DIAGNOSIS — E039 Hypothyroidism, unspecified: Secondary | ICD-10-CM | POA: Diagnosis not present

## 2016-11-09 DIAGNOSIS — Z131 Encounter for screening for diabetes mellitus: Secondary | ICD-10-CM | POA: Diagnosis not present

## 2016-11-09 DIAGNOSIS — Z1159 Encounter for screening for other viral diseases: Secondary | ICD-10-CM

## 2016-11-09 DIAGNOSIS — R7989 Other specified abnormal findings of blood chemistry: Secondary | ICD-10-CM

## 2016-11-09 DIAGNOSIS — C61 Malignant neoplasm of prostate: Secondary | ICD-10-CM | POA: Diagnosis not present

## 2016-11-09 DIAGNOSIS — R350 Frequency of micturition: Secondary | ICD-10-CM

## 2016-11-09 DIAGNOSIS — F329 Major depressive disorder, single episode, unspecified: Secondary | ICD-10-CM

## 2016-11-09 LAB — POCT URINALYSIS DIP (MANUAL ENTRY)
Bilirubin, UA: NEGATIVE
Blood, UA: NEGATIVE
Glucose, UA: NEGATIVE mg/dL
Ketones, POC UA: NEGATIVE mg/dL
LEUKOCYTES UA: NEGATIVE
NITRITE UA: NEGATIVE
PH UA: 5.5 (ref 5.0–8.0)
Protein Ur, POC: NEGATIVE mg/dL
Spec Grav, UA: 1.015 (ref 1.010–1.025)
UROBILINOGEN UA: 0.2 U/dL

## 2016-11-09 MED ORDER — LEVOTHYROXINE SODIUM 137 MCG PO TABS: 137.0000 ug | ORAL_TABLET | Freq: Every day | ORAL | 3 refills | Status: DC

## 2016-11-09 MED ORDER — MIRTAZAPINE 15 MG PO TABS: ORAL_TABLET | ORAL | 3 refills | Status: DC

## 2016-11-09 MED ORDER — CIPROFLOXACIN HCL 500 MG PO TABS: 500.0000 mg | ORAL_TABLET | Freq: Two times a day (BID) | ORAL | 0 refills | Status: DC

## 2016-11-09 NOTE — Progress Notes (Signed)
By signing my name below, I, Mesha Guinyard, attest that this documentation has been prepared under the direction and in the presence of Merri Ray, MD.  Electronically Signed: Verlee Monte, Medical Scribe. 11/09/16. 9:32 AM.  Subjective:    Patient ID: Tyler Randall, male    DOB: 05-16-1945, 71 y.o.   MRN: [id number]  HPI  Chief Complaint  Patient presents with  . Annual Exam  . urinay frequency    x past month    HPI Comments: Tyler Randall is a 71 y.o. male with a PMHx of hypothyroidism, prostate CA, and melanoma who presents to Primary Care at [facility] for his annual exam.  Urinary Frequency: Pt constantly feels like he has to urinate for the past month with nocturia 1-2x a night- no change in nocturia, just day time urination. Pt drinks ice-tea 2x a day, at every meal- not a new habit. Pt does not drink coffee. States that he is usually not uncomfortable at prostate exams at urology. Denies fever, abdominal pain, urinary incontinence, dysuria, pain with defecation, and polydipsia.  Cerumen Impaction: Pt has had his ears flushed out from time to time, but he's currently feeling fine.   Hypothyroidism: Takes synthroid 137 mcg Lab Results  Component Value Date   TSH 1.350 05/05/2016   Low Vit D: Most recent level was low in Jan at 58, recommened OTC supplementation. Pt has been taking Vit D 1000 units for a couple of months.  Cancer Screening: Prostate CA: Followed by Dr. Risa Grill. Reviewed from March 26th. Status post radiation therapy, seed implantation- brachy therapy. Radiation completed 2003. He was having rising PSA at that office visit. PSA waxed and waned from 0.6 to 0.9 over prior 3 years. Planned for annual follow-up. No results found for: PSA Colon: Colonoscopy 2014 Dr. Sharlett Iles. Repeat in 5 years. Skin CA: Followed by dermatology, Dr. Wilhemina Bonito. He hasn't been seen in a while (overdue 8 months), but plans on scheduling appt in the fall.  Immunizations: Pt  agrees to get his 2nd pneumo vax and tetanus vaccine. He had his shingles vaccine a year ago. Pt declined the new shingles vaccine. Immunization History  Administered Date(s) Administered  . Influenza Split 02/07/2012  . Influenza,inj,Quad PF,36+ Mos 02/20/2013, 03/27/2014  . Influenza-Unspecified 01/09/2016  . Pneumococcal Conjugate-13 06/04/2014   Hep C Screening: Pt agrees to Hep C screening.  Vision: Pt last saw his eye doctor in 2013.  Visual Acuity Screening   Right eye Left eye Both eyes  Without correction:     With correction: 20/20 20/20 20/20    Dentist: Followed by dentist with biannual appts.  Exercise: Moderate amount, and stretches. Does a lot of walking at work.  Depression Screening: Started remron 15 mg QHS for depression, and it also helps him sleep. He's been on this medication for years and it's helped his depression. Denies experiencing negative side effects from remron. Depression screen [facility] 2/9 11/09/2016 10/25/2016 10/03/2016 05/05/2016 04/28/2015  Decreased Interest 0 0 0 0 0  Down, Depressed, Hopeless 0 0 0 0 0  PHQ - 2 Score 0 0 0 0 0   Advance Directive: Pt does not have one.  Fall Screening: Fall Risk  11/09/2016 10/25/2016 10/03/2016 05/05/2016 07/01/2015  Falls in the past year? No No No No No   Functional Status Survey: Is the patient deaf or have difficulty hearing?: No Does the patient have difficulty seeing, even when wearing glasses/contacts?: No Does the patient have difficulty concentrating, remembering, or making decisions?:  No Does the patient have difficulty walking or climbing stairs?: No Does the patient have difficulty dressing or bathing?: No Does the patient have difficulty doing errands alone such as visiting a doctor's office or shopping?: No  Patient Active Problem List   Diagnosis Date Noted  . Prostate cancer ([place]) 03/27/2014  . Melanoma of skin, site unspecified 10/06/2013  . Unspecified hypothyroidism 10/06/2013  . Unspecified  vitamin D deficiency 10/06/2013  . Thyroid condition 11/28/2011   Past Medical History:  Diagnosis Date  . Allergy   . Cancer [facility])    prostate and skin  . Depression   . Thyroid disease    hypothyroidism   Past Surgical History:  Procedure Laterality Date  . PROSTATE SURGERY     Allergies  Allergen Reactions  . Latex     With prolonged exposure  . Sulfa Antibiotics     Not sure reaction, started in infancy   Prior to Admission medications   Medication Sig Start Date End Date Taking? Authorizing Provider  Biotin 5000 MCG TABS Take by mouth.   Yes [provider]  levothyroxine (SYNTHROID, LEVOTHROID) 137 MCG tablet Take 1 tablet (137 mcg total) by mouth daily before breakfast. 05/05/16  Yes [name], MD  mirtazapine (REMERON) 15 MG tablet TAKE 1 TABLET (15 MG TOTAL) BY MOUTH AT BEDTIME. 05/05/16  Yes [name], MD  Multiple Vitamins-Minerals (CENTRUM SILVER PO) Take 1 tablet by mouth.   Yes [provider]  tadalafil (CIALIS) 5 MG tablet Take 1 tablet (5 mg total) by mouth as needed. 03/27/14  Yes Darlyne Russian, MD  Vitamin D, Ergocalciferol, (DRISDOL) 50000 units CAPS capsule TAKE 1 CAPSULE (50,000 UNITS TOTAL) BY MOUTH EVERY 7 (SEVEN) DAYS. 04/28/15  Yes Tereasa Coop, PA-C   Social History   Social History  . Marital status: Divorced    Spouse name: N/A  . Number of children: N/A  . Years of education: N/A   Occupational History  . Not on file.   Social History Main Topics  . Smoking status: Former Smoker    Packs/day: 0.50    Quit date: 02/03/2016  . Smokeless tobacco: Never Used  . Alcohol use 12.0 oz/week    20 Cans of beer per week  . Drug use: No  . Sexual activity: Not on file   Other Topics Concern  . Not on file   Social History Narrative  . No narrative on file   Review of Systems 13 point ROS was negative. Objective:  Physical Exam  Constitutional: He is oriented to person, place, and time. He appears  well-developed and well-nourished.  HENT:  Head: Normocephalic and atraumatic.  Right Ear: Tympanic membrane and external ear normal.  Left Ear: Tympanic membrane and external ear normal.  Mouth/Throat: Oropharynx is clear and moist.  Moderate cerumen Visualized TM appear nl  Eyes: Pupils are equal, round, and reactive to light. Conjunctivae and EOM are normal.  Neck: Normal range of motion. Neck supple. No thyromegaly present.  Cardiovascular: Normal rate, regular rhythm, normal heart sounds and intact distal pulses.   Pulmonary/Chest: Effort normal and breath sounds normal. No respiratory distress. He has no wheezes.  Abdominal: Soft. He exhibits no distension. There is no tenderness. There is no CVA tenderness. Hernia confirmed negative in the right inguinal area and confirmed negative in the left inguinal area.  Genitourinary: Prostate normal. Right testis shows no tenderness. Left testis shows no tenderness. No discharge found.  Genitourinary Comments: Prostate was  tender, slightly enlarged States that he is usually not uncomfortable at prostate exams at urology  Musculoskeletal: Normal range of motion. He exhibits no edema or tenderness.  Lymphadenopathy:    He has no cervical adenopathy.  Neurological: He is alert and oriented to person, place, and time. He has normal reflexes.  Skin: Skin is warm and dry.  Psychiatric: He has a normal mood and affect. His behavior is normal.  Vitals reviewed.   Vitals:   11/09/16 0826 11/09/16 0848  BP: (!) 151/88 (!) 151/83  Pulse: 91 90  Resp: 17   Temp: 97.8 F (36.6 C)   TempSrc: Oral   SpO2: 98%   Weight: 139 lb 6.4 oz (63.2 kg)   Height: 6' 1.82" (1.875 m)    Body mass index is 17.99 kg/m.  Results for orders placed or performed in visit on 11/09/16  POCT urinalysis dipstick  Result Value Ref Range   Color, UA straw (A) yellow   Clarity, UA clear clear   Glucose, UA negative negative mg/dL   Bilirubin, UA negative negative    Ketones, POC UA negative negative mg/dL   Spec Grav, UA 1.015 1.010 - 1.025   Blood, UA negative negative   pH, UA 5.5 5.0 - 8.0   Protein Ur, POC negative negative mg/dL   Urobilinogen, UA 0.2 0.2 or 1.0 E.U./dL   Nitrite, UA Negative Negative   Leukocytes, UA Negative Negative   Assessment & Plan:    MITHRAN STRIKE is a 71 y.o. male Annual physical exam  -  - anticipatory guidance as below in AVS, screening labs if needed. Health maintenance items as above in HPI discussed/recommended as applicable.   - no concerning responses on depression, fall, or functional status screening. Any positive responses noted as above. Advanced directives discussed as in CHL. Will send paperwork to patient as left office prior to providing him with that paperwork.  Hypothyroidism, unspecified type - Plan: TSH, levothyroxine (SYNTHROID, LEVOTHROID) 137 MCG tablet  - Stable on previous readings, continue same dose  Acute prostatitis - Plan: ciprofloxacin (CIPRO) 500 MG tablet, Urine Culture Urinary frequency - Plan: PSA, POCT urinalysis dipstick, Urine Microscopic Prostate cancer ([place]) - Plan: PSA, POCT urinalysis dipstick, Urine Microscopic  - History of prostate cancer, seed treatment, relatively stable PSAs recently. Now with one month of urinary frequency, discomfort on exam, suspicious for prostatitis.  -Start Cipro 500 mg twice a day, potential side effects and tendinopathy risks discussed.  -Check PSA, urine culture, RTC precautions, or may need follow-up with urology if persistent frequency  Screening for hyperlipidemia - Plan: Comprehensive metabolic panel, Lipid panel  Screening for diabetes mellitus  -Check CMP, but likely cause of frequency as above with prostatitis  Low vitamin D level - Plan: Vitamin D, 25-hydroxy  -Previously vitamin D level, but normal vitamin D when previously checked. Will now evaluate on over-the-counter dosing  Encounter for hepatitis C screening test for low risk  patient - Plan: Hepatitis C antibody  Need for prophylactic vaccination against Streptococcus pneumoniae (pneumococcus) - Plan: Pneumococcal polysaccharide vaccine 23-valent greater than or equal to 2yo subcutaneous/IM  - Pneumovax given, status post Prevnar.  Need for Tdap vaccination - Plan: Tdap vaccine greater than or equal to 7yo IM  Depression, unspecified depression type - Plan: mirtazapine (REMERON) 15 MG tablet  - Stable on Remeron. Continue same dosing.  Meds ordered this encounter  Medications  . ciprofloxacin (CIPRO) 500 MG tablet    Sig: Take 1 tablet (500 mg  total) by mouth 2 (two) times daily.    Dispense:  20 tablet    Refill:  0  . levothyroxine (SYNTHROID, LEVOTHROID) 137 MCG tablet    Sig: Take 1 tablet (137 mcg total) by mouth daily before breakfast.    Dispense:  90 tablet    Refill:  3  . mirtazapine (REMERON) 15 MG tablet    Sig: TAKE 1 TABLET (15 MG TOTAL) BY MOUTH AT BEDTIME.    Dispense:  90 tablet    Refill:  3   Patient Instructions    Keep follow up with dermatologist - schedule appointment, but let me know if referral needed. Based on your exam, I'm suspicious for prostatitis. Start Cipro twice per day. I will check other urine tests. If frequency does not improve with antibiotics, would recommend discussing with your urologist. Tdap and pneumonia vaccines were updated today. I will check other blood work as we discussed.  Return to the clinic or go to the nearest emergency room if any of your symptoms worsen or new symptoms occur.   Keeping you healthy  Get these tests  Blood pressure- Have your blood pressure checked once a year by your healthcare provider.  Normal blood pressure is 120/80  Weight- Have your body mass index (BMI) calculated to screen for obesity.  BMI is a measure of body fat based on height and weight. You can also calculate your own BMI at [employer].si.  Cholesterol- Have your cholesterol checked every  year.  Diabetes- Have your blood sugar checked regularly if you have high blood pressure, high cholesterol, have a family history of diabetes or if you are overweight.  Screening for Colon Cancer- Colonoscopy starting at age 45.  Screening may begin sooner depending on your family history and other health conditions. Follow up colonoscopy as directed by your Gastroenterologist.  Screening for Prostate Cancer- Both blood work (PSA) and a rectal exam help screen for Prostate Cancer.  Screening begins at age 28 with African-American men and at age 95 with Caucasian men.  Screening may begin sooner depending on your family history.  Take these medicines  Aspirin- One aspirin daily can help prevent Heart disease and Stroke.  Flu shot- Every fall.  Tetanus- Every 10 years.  Zostavax- Once after the age of 24 to prevent Shingles.  Pneumonia shot- Once after the age of 16; if you are younger than 58, ask your healthcare provider if you need a Pneumonia shot.  Take these steps  Don't smoke- If you do smoke, talk to your doctor about quitting.  For tips on how to quit, go to www.smokefree.gov or call 1-800-QUIT-NOW.  Be physically active- Exercise 5 days a week for at least 30 minutes.  If you are not already physically active start slow and gradually work up to 30 minutes of moderate physical activity.  Examples of moderate activity include walking briskly, mowing the yard, dancing, swimming, bicycling, etc.  Eat a healthy diet- Eat a variety of healthy food such as fruits, vegetables, low fat milk, low fat cheese, yogurt, lean meant, poultry, fish, beans, tofu, etc. For more information go to www.thenutritionsource.org  Drink alcohol in moderation- Limit alcohol intake to less than two drinks a day. Never drink and drive.  Dentist- Brush and floss twice daily; visit your dentist twice a year.  Depression- Your emotional health is as important as your physical health. If you're feeling down,  or losing interest in things you would normally enjoy please talk to your healthcare provider.  Eye exam- Visit your eye doctor every year.  Safe sex- If you may be exposed to a sexually transmitted infection, use a condom.  Seat belts- Seat belts can save your life; always wear one.  Smoke/Carbon Monoxide detectors- These detectors need to be installed on the appropriate level of your home.  Replace batteries at least once a year.  Skin cancer- When out in the sun, cover up and use sunscreen 15 SPF or higher.  Violence- If anyone is threatening you, please tell your healthcare provider.  Living Will/ Health care power of attorney- Speak with your healthcare provider and family. Prostatitis Prostatitis is swelling or inflammation of the prostate gland. The prostate is a walnut-sized gland that is involved in the production of semen. It is located below a man's bladder, in front of the rectum. There are four types of prostatitis:  Chronic nonbacterial prostatitis. This is the most common type of prostatitis. It may be associated with a viral infection or autoimmune disorder.  Acute bacterial prostatitis. This is the least common type of prostatitis. It starts quickly and is usually associated with a bladder infection, high fever, and shaking chills. It can occur at any age.  Chronic bacterial prostatitis. This type usually results from acute bacterial prostatitis that happens repeatedly (is recurrent) or has not been treated properly. It can occur in men of any age but is most common among middle-aged men whose prostate has begun to get larger. The symptoms are not as severe as symptoms caused by acute bacterial prostatitis.  Prostatodynia or chronic pelvic pain syndrome (CPPS). This type is also called pelvic floor disorder. It is associated with increased muscular tone in the pelvis surrounding the prostate.  What are the causes? Bacterial prostatitis is caused by infection from  bacteria. Chronic nonbacterial prostatitis may be caused by:  Urinary tract infections (UTIs).  Nerve damage.  A response by the body's disease-fighting system (autoimmune response).  Chemicals in the urine.  The causes of the other types of prostatitis are usually not known. What are the signs or symptoms? Symptoms of this condition vary depending upon the type of prostatitis. If you have acute bacterial prostatitis, you may experience:  Urinary symptoms, such as: ? Painful urination. ? Burning during urination. ? Frequent and sudden urges to urinate. ? Inability to start urinating. ? A weak or interrupted stream of urine.  Vomiting.  Nausea.  Fever.  Chills.  Inability to empty the bladder completely.  Pain in the: ? Muscles or joints. ? Lower back. ? Lower abdomen.  If you have any of the other types of prostatitis, you may experience:  Urinary symptoms, such as: ? Sudden urges to urinate. ? Frequent urination. ? Difficulty starting urination. ? Weak urine stream. ? Dribbling after urination.  Discharge from the urethra. The urethra is a tube that opens at the end of the penis.  Pain in the: ? Testicles. ? Penis or tip of the penis. ? Rectum. ? Area in front of the rectum and below the scrotum (perineum).  Problems with sexual function.  Painful ejaculation.  Bloody semen.  How is this diagnosed? This condition may be diagnosed based on:  A physical and medical exam.  Your symptoms.  A urine test to check for bacteria.  An exam in which a health care provider uses a finger to feel the prostate (digital rectal exam).  A test of a sample of semen.  Blood tests.  Ultrasound.  Removal of prostate tissue to be examined under  a microscope (biopsy).  Tests to check how your body handles urine (urodynamic tests).  A test to look inside your bladder or urethra (cystoscopy).  How is this treated? Treatment for this condition depends on the  type of prostatitis. Treatment may involve:  Medicines to relieve pain or inflammation.  Medicines to help relax your muscles.  Physical therapy.  Heat therapy.  Techniques to help you control certain body functions (biofeedback).  Relaxation exercises.  Antibiotic medicine, if your condition is caused by bacteria.  Warm water baths (sitz baths). Sitz baths help with relaxing your pelvic floor muscles, which helps to relieve pressure on the prostate.  Follow these instructions at home:  Take over-the-counter and prescription medicines only as told by your health care provider.  If you were prescribed an antibiotic, take it as told by your health care provider. Do not stop taking the antibiotic even if you start to feel better.  If physical therapy, biofeedback, or relaxation exercises were prescribed, do exercises as instructed.  Take sitz baths as directed by your health care provider. For a sitz bath, sit in warm water that is deep enough to cover your hips and buttocks.  Keep all follow-up visits as told by your health care provider. This is important. Contact a health care provider if:  Your symptoms get worse.  You have a fever. Get help right away if:  You have chills.  You feel nauseous.  You vomit.  You feel light-headed or feel like you are going to faint.  You are unable to urinate.  You have blood or blood clots in your urine. This information is not intended to replace advice given to you by your health care provider. Make sure you discuss any questions you have with your health care provider. Document Released: 03/24/2000 Document Revised: 12/16/2015 Document Reviewed: 12/16/2015 Elsevier Interactive Patient Education  2017 [employer].  IF you received an x-ray today, you will receive an invoice from [facility]. Please contact [facility] at [phone] with questions or concerns regarding your invoice.   IF you received labwork  today, you will receive an invoice from [place]. Please contact [facility] at [phone] with questions or concerns regarding your invoice.   Our billing staff will not be able to assist you with questions regarding bills from these companies.  You will be contacted with the lab results as soon as they are available. The fastest way to get your results is to activate your My Chart account. Instructions are located on the last page of this paperwork. If you have not heard from [place] regarding the results in 2 weeks, please contact this office.      I personally performed the services described in this documentation, which was scribed in my presence. The recorded information has been reviewed and considered for accuracy and completeness, addended by me as needed, and agree with information above.  Signed,   Merri Ray, MD [facility] at [place].  11/09/16 11:24 AM

## 2016-11-09 NOTE — Telephone Encounter (Signed)
Mailed advanced directive to patient as he and dr Carlota Raspberry discussed at visit on 11/09/2016.  Pt is aware to be on the look out for info in the mail. dg

## 2016-11-09 NOTE — Patient Instructions (Addendum)
Keep follow up with dermatologist - schedule appointment, but let me know if referral needed. Based on your exam, I'm suspicious for prostatitis. Start Cipro twice per day. I will check other urine tests. If frequency does not improve with antibiotics, would recommend discussing with your urologist. Tdap and pneumonia vaccines were updated today. I will check other blood work as we discussed.  Return to the clinic or go to the nearest emergency room if any of your symptoms worsen or new symptoms occur.   Keeping you healthy  Get these tests  Blood pressure- Have your blood pressure checked once a year by your healthcare provider.  Normal blood pressure is 120/80  Weight- Have your body mass index (BMI) calculated to screen for obesity.  BMI is a measure of body fat based on height and weight. You can also calculate your own BMI at ViewBanking.si.  Cholesterol- Have your cholesterol checked every year.  Diabetes- Have your blood sugar checked regularly if you have high blood pressure, high cholesterol, have a family history of diabetes or if you are overweight.  Screening for Colon Cancer- Colonoscopy starting at age 64.  Screening may begin sooner depending on your family history and other health conditions. Follow up colonoscopy as directed by your Gastroenterologist.  Screening for Prostate Cancer- Both blood work (PSA) and a rectal exam help screen for Prostate Cancer.  Screening begins at age 4 with African-American men and at age 83 with Caucasian men.  Screening may begin sooner depending on your family history.  Take these medicines  Aspirin- One aspirin daily can help prevent Heart disease and Stroke.  Flu shot- Every fall.  Tetanus- Every 10 years.  Zostavax- Once after the age of 64 to prevent Shingles.  Pneumonia shot- Once after the age of 40; if you are younger than 27, ask your healthcare provider if you need a Pneumonia shot.  Take these steps  Don't  smoke- If you do smoke, talk to your doctor about quitting.  For tips on how to quit, go to www.smokefree.gov or call 1-800-QUIT-NOW.  Be physically active- Exercise 5 days a week for at least 30 minutes.  If you are not already physically active start slow and gradually work up to 30 minutes of moderate physical activity.  Examples of moderate activity include walking briskly, mowing the yard, dancing, swimming, bicycling, etc.  Eat a healthy diet- Eat a variety of healthy food such as fruits, vegetables, low fat milk, low fat cheese, yogurt, lean meant, poultry, fish, beans, tofu, etc. For more information go to www.thenutritionsource.org  Drink alcohol in moderation- Limit alcohol intake to less than two drinks a day. Never drink and drive.  Dentist- Brush and floss twice daily; visit your dentist twice a year.  Depression- Your emotional health is as important as your physical health. If you're feeling down, or losing interest in things you would normally enjoy please talk to your healthcare provider.  Eye exam- Visit your eye doctor every year.  Safe sex- If you may be exposed to a sexually transmitted infection, use a condom.  Seat belts- Seat belts can save your life; always wear one.  Smoke/Carbon Monoxide detectors- These detectors need to be installed on the appropriate level of your home.  Replace batteries at least once a year.  Skin cancer- When out in the sun, cover up and use sunscreen 15 SPF or higher.  Violence- If anyone is threatening you, please tell your healthcare provider.  Living Will/ Health care power of  attorney- Speak with your healthcare provider and family. Prostatitis Prostatitis is swelling or inflammation of the prostate gland. The prostate is a walnut-sized gland that is involved in the production of semen. It is located below a man's bladder, in front of the rectum. There are four types of prostatitis:  Chronic nonbacterial prostatitis. This is the most  common type of prostatitis. It may be associated with a viral infection or autoimmune disorder.  Acute bacterial prostatitis. This is the least common type of prostatitis. It starts quickly and is usually associated with a bladder infection, high fever, and shaking chills. It can occur at any age.  Chronic bacterial prostatitis. This type usually results from acute bacterial prostatitis that happens repeatedly (is recurrent) or has not been treated properly. It can occur in men of any age but is most common among middle-aged men whose prostate has begun to get larger. The symptoms are not as severe as symptoms caused by acute bacterial prostatitis.  Prostatodynia or chronic pelvic pain syndrome (CPPS). This type is also called pelvic floor disorder. It is associated with increased muscular tone in the pelvis surrounding the prostate.  What are the causes? Bacterial prostatitis is caused by infection from bacteria. Chronic nonbacterial prostatitis may be caused by:  Urinary tract infections (UTIs).  Nerve damage.  A response by the body's disease-fighting system (autoimmune response).  Chemicals in the urine.  The causes of the other types of prostatitis are usually not known. What are the signs or symptoms? Symptoms of this condition vary depending upon the type of prostatitis. If you have acute bacterial prostatitis, you may experience:  Urinary symptoms, such as: ? Painful urination. ? Burning during urination. ? Frequent and sudden urges to urinate. ? Inability to start urinating. ? A weak or interrupted stream of urine.  Vomiting.  Nausea.  Fever.  Chills.  Inability to empty the bladder completely.  Pain in the: ? Muscles or joints. ? Lower back. ? Lower abdomen.  If you have any of the other types of prostatitis, you may experience:  Urinary symptoms, such as: ? Sudden urges to urinate. ? Frequent urination. ? Difficulty starting urination. ? Weak urine  stream. ? Dribbling after urination.  Discharge from the urethra. The urethra is a tube that opens at the end of the penis.  Pain in the: ? Testicles. ? Penis or tip of the penis. ? Rectum. ? Area in front of the rectum and below the scrotum (perineum).  Problems with sexual function.  Painful ejaculation.  Bloody semen.  How is this diagnosed? This condition may be diagnosed based on:  A physical and medical exam.  Your symptoms.  A urine test to check for bacteria.  An exam in which a health care provider uses a finger to feel the prostate (digital rectal exam).  A test of a sample of semen.  Blood tests.  Ultrasound.  Removal of prostate tissue to be examined under a microscope (biopsy).  Tests to check how your body handles urine (urodynamic tests).  A test to look inside your bladder or urethra (cystoscopy).  How is this treated? Treatment for this condition depends on the type of prostatitis. Treatment may involve:  Medicines to relieve pain or inflammation.  Medicines to help relax your muscles.  Physical therapy.  Heat therapy.  Techniques to help you control certain body functions (biofeedback).  Relaxation exercises.  Antibiotic medicine, if your condition is caused by bacteria.  Warm water baths (sitz baths). Sitz baths help with  relaxing your pelvic floor muscles, which helps to relieve pressure on the prostate.  Follow these instructions at home:  Take over-the-counter and prescription medicines only as told by your health care provider.  If you were prescribed an antibiotic, take it as told by your health care provider. Do not stop taking the antibiotic even if you start to feel better.  If physical therapy, biofeedback, or relaxation exercises were prescribed, do exercises as instructed.  Take sitz baths as directed by your health care provider. For a sitz bath, sit in warm water that is deep enough to cover your hips and  buttocks.  Keep all follow-up visits as told by your health care provider. This is important. Contact a health care provider if:  Your symptoms get worse.  You have a fever. Get help right away if:  You have chills.  You feel nauseous.  You vomit.  You feel light-headed or feel like you are going to faint.  You are unable to urinate.  You have blood or blood clots in your urine. This information is not intended to replace advice given to you by your health care provider. Make sure you discuss any questions you have with your health care provider. Document Released: 03/24/2000 Document Revised: 12/16/2015 Document Reviewed: 12/16/2015 Elsevier Interactive Patient Education  2017 [employer].  IF you received an x-ray today, you will receive an invoice from [facility]. Please contact [facility] at [phone] with questions or concerns regarding your invoice.   IF you received labwork today, you will receive an invoice from [facility]. Please contact [facility] at [phone] with questions or concerns regarding your invoice.   Our billing staff will not be able to assist you with questions regarding bills from these companies.  You will be contacted with the lab results as soon as they are available. The fastest way to get your results is to activate your My Chart account. Instructions are located on the last page of this paperwork. If you have not heard from [place] regarding the results in 2 weeks, please contact this office.

## 2016-11-10 LAB — URINALYSIS, MICROSCOPIC ONLY
Bacteria, UA: NONE SEEN
Casts: NONE SEEN /lpf
Epithelial Cells (non renal): NONE SEEN /hpf (ref 0–10)

## 2016-11-10 LAB — LIPID PANEL
CHOL/HDL RATIO: 1.8 ratio (ref 0.0–5.0)
CHOLESTEROL TOTAL: 181 mg/dL (ref 100–199)
HDL: 101 mg/dL (ref >39)
LDL CALC: 67 mg/dL (ref 0–99)
TRIGLYCERIDES: 64 mg/dL (ref 0–149)
VLDL CHOLESTEROL CAL: 13 mg/dL (ref 5–40)

## 2016-11-10 LAB — PSA: PROSTATE SPECIFIC AG, SERUM: 1.4 ng/mL (ref 0.0–4.0)

## 2016-11-10 LAB — COMPREHENSIVE METABOLIC PANEL
ALBUMIN: 4.6 g/dL (ref 3.6–4.8)
ALT: 8 IU/L (ref 0–44)
AST: 19 IU/L (ref 0–40)
Albumin/Globulin Ratio: 1.6 (ref 1.2–2.2)
Alkaline Phosphatase: 72 IU/L (ref 39–117)
BUN / CREAT RATIO: 11 (ref 10–24)
BUN: 8 mg/dL (ref 8–27)
Bilirubin Total: 1.4 mg/dL — ABNORMAL HIGH (ref 0.0–1.2)
CALCIUM: 9.6 mg/dL (ref 8.6–10.2)
CO2: 25 mmol/L (ref 20–29)
CREATININE: 0.75 mg/dL — AB (ref 0.76–1.27)
Chloride: 99 mmol/L (ref 96–106)
GFR calc Af Amer: 109 mL/min/{1.73_m2} (ref >59)
GFR, EST NON AFRICAN AMERICAN: 94 mL/min/{1.73_m2} (ref >59)
GLOBULIN, TOTAL: 2.8 g/dL (ref 1.5–4.5)
GLUCOSE: 109 mg/dL — AB (ref 65–99)
Potassium: 4.5 mmol/L (ref 3.5–5.2)
SODIUM: 142 mmol/L (ref 134–144)
Total Protein: 7.4 g/dL (ref 6.0–8.5)

## 2016-11-10 LAB — HEPATITIS C ANTIBODY

## 2016-11-10 LAB — TSH: TSH: 2.29 u[IU]/mL (ref 0.450–4.500)

## 2016-11-10 LAB — URINE CULTURE: Organism ID, Bacteria: NO GROWTH

## 2016-11-10 LAB — VITAMIN D 25 HYDROXY (VIT D DEFICIENCY, FRACTURES): Vit D, 25-Hydroxy: 43.4 ng/mL (ref 30.0–100.0)

## 2016-11-13 ENCOUNTER — Telehealth: Payer: Self-pay | Admitting: Family Medicine

## 2016-11-13 NOTE — Telephone Encounter (Signed)
Pt was retuning phone call about labs.  He states it is ok to leave him a VM  Day time number [phone] (work)  After 4 p.m [phone] (home)

## 2016-11-14 NOTE — Telephone Encounter (Signed)
Pt called about labs I explain labs to pt understands I asked him how he was doing he said that he was fine

## 2017-01-25 DIAGNOSIS — Z23 Encounter for immunization: Secondary | ICD-10-CM | POA: Diagnosis not present

## 2017-05-01 ENCOUNTER — Other Ambulatory Visit: Payer: Self-pay | Admitting: Family Medicine

## 2017-05-01 DIAGNOSIS — F32A Depression, unspecified: Secondary | ICD-10-CM

## 2017-05-01 DIAGNOSIS — F329 Major depressive disorder, single episode, unspecified: Secondary | ICD-10-CM

## 2017-06-07 ENCOUNTER — Other Ambulatory Visit: Payer: Self-pay | Admitting: Family Medicine

## 2017-06-07 DIAGNOSIS — Z0000 Encounter for general adult medical examination without abnormal findings: Secondary | ICD-10-CM

## 2017-06-12 ENCOUNTER — Encounter: Payer: Self-pay | Admitting: Family Medicine

## 2017-06-12 ENCOUNTER — Ambulatory Visit (INDEPENDENT_AMBULATORY_CARE_PROVIDER_SITE_OTHER): Payer: 59 | Admitting: Family Medicine

## 2017-06-12 VITALS — BP 144/83 | HR 94 | Temp 98.7°F | Resp 17 | Ht 74.5 in | Wt 145.0 lb

## 2017-06-12 DIAGNOSIS — C61 Malignant neoplasm of prostate: Secondary | ICD-10-CM

## 2017-06-12 DIAGNOSIS — N529 Male erectile dysfunction, unspecified: Secondary | ICD-10-CM | POA: Diagnosis not present

## 2017-06-12 DIAGNOSIS — Z0000 Encounter for general adult medical examination without abnormal findings: Secondary | ICD-10-CM

## 2017-06-12 MED ORDER — TADALAFIL 5 MG PO TABS: 5.0000 mg | ORAL_TABLET | ORAL | 11 refills | Status: DC | PRN

## 2017-06-12 NOTE — Progress Notes (Signed)
Subjective:  By signing my name below, I, Tyler Randall, attest that this documentation has been prepared under the direction and in the presence of [name], MD Electronically Signed: Ladene Artist, ED Scribe 06/12/2017 at 4:45 PM.   Patient ID: Tyler Randall, male    DOB: 1946/01/04, 72 y.o.   MRN: [id number]   Chief Complaint  Patient presents with  . Medication Refill    tadalafil   HPI Tyler Randall is a 72 y.o. male who presents to Primary Care at [facility] for f/u and refill of Cialis. H/o prostate CA with seed treatment. Was treated for possible prostatitis with Cipro in Aug 2018. PSA 1.4 at that time. Urologist is Dr. Risa Grill at [facility]. Last seen in  March 2018. Stable with plan for annual f/u. Has used Cialis for ED. Neg urine culture at that time. Did have improvement with antibiotic.  Pt has an upcoming appointment with Dr. Jeffie Pollock in April. He is taking 5 mg Cialis PRN, once-twice/month. Denies cyanopsia, flushing, hearing loss, back pain, cp or sob with physical activity or intercourse, chest tightness, HA, h/o heart disease.  Hypothyroidism  Lab Results  Component Value Date   TSH 2.290 11/09/2016  Synthroid 137 mcg qd.  Vitamin D Currently taking 1,000 units of Vit D OTC qd. He was taking 50,000 units at his visit in Aug.  Patient Active Problem List   Diagnosis Date Noted  . Prostate cancer ([place]) 03/27/2014  . Melanoma of skin, site unspecified 10/06/2013  . Unspecified hypothyroidism 10/06/2013  . Unspecified vitamin D deficiency 10/06/2013  . Thyroid condition 11/28/2011   Past Medical History:  Diagnosis Date  . Allergy   . Cancer [facility])    prostate and skin  . Depression   . Thyroid disease    hypothyroidism   Past Surgical History:  Procedure Laterality Date  . PROSTATE SURGERY     Allergies  Allergen Reactions  . Latex     With prolonged exposure  . Sulfa Antibiotics     Not sure reaction, started in infancy   Prior to  Admission medications   Medication Sig Start Date End Date Taking? Authorizing Provider  Biotin 5000 MCG TABS Take by mouth.   Yes [provider]  ciprofloxacin (CIPRO) 500 MG tablet Take 1 tablet (500 mg total) by mouth 2 (two) times daily. 11/09/16  Yes [name], MD  levothyroxine (SYNTHROID, LEVOTHROID) 137 MCG tablet Take 1 tablet (137 mcg total) by mouth daily before breakfast. 11/09/16  Yes [name], MD  mirtazapine (REMERON) 15 MG tablet TAKE 1 TABLET (15 MG TOTAL) BY MOUTH AT BEDTIME. 11/09/16  Yes [name], MD  mirtazapine (REMERON) 15 MG tablet TAKE 1 TABLET (15 MG TOTAL) BY MOUTH AT BEDTIME. 05/02/17  Yes [name], MD  Multiple Vitamins-Minerals (CENTRUM SILVER PO) Take 1 tablet by mouth.   Yes [provider]  tadalafil (CIALIS) 5 MG tablet Take 1 tablet (5 mg total) by mouth as needed. 03/27/14  Yes Darlyne Russian, MD   Social History   Socioeconomic History  . Marital status: Divorced    Spouse name: Not on file  . Number of children: Not on file  . Years of education: Not on file  . Highest education level: Not on file  Social Needs  . Financial resource strain: Not on file  . Food insecurity - worry: Not on file  . Food insecurity - inability: Not on file  .  Transportation needs - medical: Not on file  . Transportation needs - non-medical: Not on file  Occupational History  . Not on file  Tobacco Use  . Smoking status: Former Smoker    Packs/day: 0.50    Last attempt to quit: 02/03/2016    Years since quitting: 1.3  . Smokeless tobacco: Never Used  Substance and Sexual Activity  . Alcohol use: Yes    Alcohol/week: 12.0 oz    Types: 20 Cans of beer per week  . Drug use: No  . Sexual activity: Not on file  Other Topics Concern  . Not on file  Social History Narrative  . Not on file   Review of Systems  Constitutional: Negative for diaphoresis.  HENT: Negative for hearing loss.   Eyes: Negative for visual  disturbance.  Respiratory: Negative for chest tightness and shortness of breath.   Cardiovascular: Negative for chest pain.  Musculoskeletal: Negative for back pain.  Neurological: Negative for headaches.     Objective:   Physical Exam  Constitutional: He is oriented to person, place, and time. He appears well-developed and well-nourished.  HENT:  Head: Normocephalic and atraumatic.  Eyes: EOM are normal. Pupils are equal, round, and reactive to light.  Neck: No JVD present. Carotid bruit is not present.  Cardiovascular: Normal rate, regular rhythm and normal heart sounds.  No murmur heard. Pulmonary/Chest: Effort normal and breath sounds normal. He has no rales.  Musculoskeletal: He exhibits no edema.  Neurological: He is alert and oriented to person, place, and time.  Skin: Skin is warm and dry.  Psychiatric: He has a normal mood and affect.  Vitals reviewed.  Vitals:   06/12/17 1541  BP: (!) 144/83  Pulse: 94  Resp: 17  Temp: 98.7 F (37.1 C)  TempSrc: Oral  SpO2: 98%  Weight: 145 lb (65.8 kg)  Height: 6' 2.5" (1.892 m)      Assessment & Plan:   [name] is a 72 y.o. male Erectile dysfunction, unspecified erectile dysfunction type  - Chantix given - use lowest effective dose. Potential Side effects discussed, Understanding expressed. Has been tolerating current low dose as needed  Denies history of cardiac disease, chest pains, or new dyspnea with exertion. Refilled Chantix.   Prostate cancer ([place])  - Remote seed treatment, has been doing well and has ongoing follow-up with urology.   Plan for follow-up physical in August with repeat lab work at that time. Follow-up sooner if needed  Meds ordered this encounter  Medications  . tadalafil (CIALIS) 5 MG tablet    Sig: Take 1 tablet (5 mg total) by mouth as needed.    Dispense:  15 tablet    Refill:  11   Patient Instructions    Based on prior stability of labs, ok to wait until August for thyroid and  vitamin D test. Continue over the counter supplement once per day for now.   Cialis refilled today.   See you in August for a physical and labwork.    IF you received an x-ray today, you will receive an invoice from [facility]. Please contact [facility] at [phone] with questions or concerns regarding your invoice.   IF you received labwork today, you will receive an invoice from [facility]. Please contact [facility] at [phone] with questions or concerns regarding your invoice.   Our billing staff will not be able to assist you with questions regarding bills from these companies.  You will be contacted with the lab results  as soon as they are available. The fastest way to get your results is to activate your My Chart account. Instructions are located on the last page of this paperwork. If you have not heard from [place] regarding the results in 2 weeks, please contact this office.       I personally performed the services described in this documentation, which was scribed in my presence. The recorded information has been reviewed and considered for accuracy and completeness, addended by me as needed, and agree with information above.  Signed,   Merri Ray, MD Primary [facility] at [facility].  06/13/17 10:27 PM

## 2017-06-12 NOTE — Patient Instructions (Addendum)
  Based on prior stability of labs, ok to wait until August for thyroid and vitamin D test. Continue over the counter supplement once per day for now.   Cialis refilled today.   See you in August for a physical and labwork.    IF you received an x-ray today, you will receive an invoice from [facility]. Please contact [facility] at [phone] with questions or concerns regarding your invoice.   IF you received labwork today, you will receive an invoice from [facility]. Please contact [facility] at [phone] with questions or concerns regarding your invoice.   Our billing staff will not be able to assist you with questions regarding bills from these companies.  You will be contacted with the lab results as soon as they are available. The fastest way to get your results is to activate your My Chart account. Instructions are located on the last page of this paperwork. If you have not heard from [place] regarding the results in 2 weeks, please contact this office.

## 2017-07-16 DIAGNOSIS — Z8546 Personal history of malignant neoplasm of prostate: Secondary | ICD-10-CM | POA: Diagnosis not present

## 2017-07-16 DIAGNOSIS — N5201 Erectile dysfunction due to arterial insufficiency: Secondary | ICD-10-CM | POA: Diagnosis not present

## 2017-11-12 ENCOUNTER — Other Ambulatory Visit: Payer: Self-pay

## 2017-11-12 ENCOUNTER — Encounter: Payer: Self-pay | Admitting: Family Medicine

## 2017-11-12 ENCOUNTER — Ambulatory Visit (INDEPENDENT_AMBULATORY_CARE_PROVIDER_SITE_OTHER): Payer: 59 | Admitting: Family Medicine

## 2017-11-12 VITALS — BP 140/82 | HR 90 | Temp 97.9°F | Ht 74.0 in | Wt 138.8 lb

## 2017-11-12 DIAGNOSIS — R739 Hyperglycemia, unspecified: Secondary | ICD-10-CM | POA: Diagnosis not present

## 2017-11-12 DIAGNOSIS — Z0000 Encounter for general adult medical examination without abnormal findings: Secondary | ICD-10-CM | POA: Diagnosis not present

## 2017-11-12 DIAGNOSIS — E039 Hypothyroidism, unspecified: Secondary | ICD-10-CM | POA: Diagnosis not present

## 2017-11-12 MED ORDER — LEVOTHYROXINE SODIUM 137 MCG PO TABS: 137.0000 ug | ORAL_TABLET | Freq: Every day | ORAL | 3 refills | Status: DC

## 2017-11-12 NOTE — Progress Notes (Signed)
Subjective:    Patient ID: Tyler Randall, male    DOB: December 18, 1945, 72 y.o.   MRN: [id number]  HPI KRISTAN VOTTA is a 72 y.o. male Presents today for: Chief Complaint  Patient presents with  . Annual Exam    CPE   History of prostate cancer, vitamin D deficiency, hypothyroidism.  Here for physical/wellness exam.  Prostate cancer with history of erectile dysfunction: Prostate cancer treated with seed treatment.  Urologist Dr. Risa Grill, then Dr. Jeffie Pollock.  Has used Cialis for erectile dysfunction.  PSA stable at April visit with urology, plan for one-year follow-up. Treated for prostatitis at physical last year.   Hypothyroidism: Lab Results  Component Value Date   TSH 2.290 11/09/2016  He takes Synthroid 137 mcg/day.  Hyperglycemia: Wt Readings from Last 3 Encounters:  11/12/17 138 lb 12.8 oz (63 kg)  06/12/17 145 lb (65.8 kg)  11/09/16 139 lb 6.4 oz (63.2 kg)  Glucose 109 at last physical in August 2018   Cancer screening: Colonoscopy 2014, plan for repeat in 5 years. Prostate - as above  Dermatology Dr. Wilhemina Bonito. Plans on scheduling appt.   Immunizations: Immunization History  Administered Date(s) Administered  . Influenza Split 02/07/2012  . Influenza,inj,Quad PF,6+ Mos 02/20/2013, 03/27/2014  . Influenza-Unspecified 01/09/2016  . Pneumococcal Conjugate-13 06/04/2014  . Pneumococcal Polysaccharide-23 11/09/2016  . Tdap 11/09/2016   Shingles:  Fall screening, no falls in the past year  Depression screening: Depression screen [facility] 2/9 11/12/2017 06/12/2017 11/09/2016 10/25/2016 10/03/2016  Decreased Interest 0 0 0 0 0  Down, Depressed, Hopeless 0 0 0 0 0  PHQ - 2 Score 0 0 0 0 0   Vision screening:  Visual Acuity Screening   Right eye Left eye Both eyes  Without correction:     With correction: 20/25 20/30-1 20/20-1  no recent optho eval.   Functional status screen: Functional Status Survey: Is the patient deaf or have difficulty hearing?: No Does the patient  have difficulty seeing, even when wearing glasses/contacts?: No Does the patient have difficulty concentrating, remembering, or making decisions?: No Does the patient have difficulty walking or climbing stairs?: No Does the patient have difficulty dressing or bathing?: No Does the patient have difficulty doing errands alone such as visiting a doctor's office or shopping?: No  Memory screen: 6CIT Screen 11/12/2017  What Year? 0 points  What month? 0 points  What time? 0 points  Count back from 20 0 points  Months in reverse 0 points  Repeat phrase 0 points  Total Score 0    Dentist, twice per year appointments  Exercise:5d/week at work - physical job.   Advanced directives: Denies current advanced directive, paperwork was provided   Patient Active Problem List   Diagnosis Date Noted  . Prostate cancer ([place]) 03/27/2014  . Melanoma of skin, site unspecified 10/06/2013  . Unspecified hypothyroidism 10/06/2013  . Unspecified vitamin D deficiency 10/06/2013  . Thyroid condition 11/28/2011   Past Medical History:  Diagnosis Date  . Allergy   . Cancer [facility])    prostate and skin  . Depression   . Thyroid disease    hypothyroidism   Past Surgical History:  Procedure Laterality Date  . PROSTATE SURGERY     Allergies  Allergen Reactions  . Latex     With prolonged exposure  . Sulfa Antibiotics     Not sure reaction, started in infancy   Prior to Admission medications   Medication Sig Start Date End Date Taking? Authorizing  Provider  levothyroxine (SYNTHROID, LEVOTHROID) 137 MCG tablet Take 1 tablet (137 mcg total) by mouth daily before breakfast. 11/09/16  Yes [name], MD  mirtazapine (REMERON) 15 MG tablet TAKE 1 TABLET (15 MG TOTAL) BY MOUTH AT BEDTIME. 11/09/16  Yes [name], MD  mirtazapine (REMERON) 15 MG tablet TAKE 1 TABLET (15 MG TOTAL) BY MOUTH AT BEDTIME. 05/02/17  Yes [name], MD  Multiple Vitamins-Minerals (CENTRUM SILVER PO) Take 1  tablet by mouth.   Yes [provider]  tadalafil (CIALIS) 5 MG tablet Take 1 tablet (5 mg total) by mouth as needed. 06/12/17  Yes [name], MD   Social History   Socioeconomic History  . Marital status: Divorced    Spouse name: Not on file  . Number of children: Not on file  . Years of education: Not on file  . Highest education level: Not on file  Occupational History  . Not on file  Social Needs  . Financial resource strain: Not on file  . Food insecurity:    Worry: Not on file    Inability: Not on file  . Transportation needs:    Medical: Not on file    Non-medical: Not on file  Tobacco Use  . Smoking status: Former Smoker    Packs/day: 0.50    Last attempt to quit: 02/03/2016    Years since quitting: 1.7  . Smokeless tobacco: Never Used  Substance and Sexual Activity  . Alcohol use: Yes    Alcohol/week: 12.0 oz    Types: 20 Cans of beer per week  . Drug use: No  . Sexual activity: Not on file  Lifestyle  . Physical activity:    Days per week: Not on file    Minutes per session: Not on file  . Stress: Not on file  Relationships  . Social connections:    Talks on phone: Not on file    Gets together: Not on file    Attends religious service: Not on file    Active member of club or organization: Not on file    Attends meetings of clubs or organizations: Not on file    Relationship status: Not on file  . Intimate partner violence:    Fear of current or ex partner: Not on file    Emotionally abused: Not on file    Physically abused: Not on file    Forced sexual activity: Not on file  Other Topics Concern  . Not on file  Social History Narrative  . Not on file    Review of Systems 13 point review of systems per patient health survey noted.  Negative other than as indicated above or in HPI.      Objective:   Physical Exam  Constitutional: He is oriented to person, place, and time. He appears well-developed and well-nourished.  HENT:    Head: Normocephalic and atraumatic.  Right Ear: External ear normal.  Left Ear: External ear normal.  Mouth/Throat: Oropharynx is clear and moist.  Eyes: Pupils are equal, round, and reactive to light. Conjunctivae and EOM are normal.  Neck: Normal range of motion. Neck supple. No thyromegaly present.  Cardiovascular: Normal rate, regular rhythm, normal heart sounds and intact distal pulses.  Pulmonary/Chest: Effort normal and breath sounds normal. No respiratory distress. He has no wheezes.  Abdominal: Soft. He exhibits no distension. There is no tenderness. A hernia is present. Hernia confirmed negative in the right inguinal area.  Musculoskeletal: Normal range  of motion. He exhibits no edema or tenderness.  Lymphadenopathy:    He has no cervical adenopathy.  Neurological: He is alert and oriented to person, place, and time. He has normal reflexes.  Skin: Skin is warm and dry.  Psychiatric: He has a normal mood and affect. His behavior is normal.  Vitals reviewed.  Vitals:   11/12/17 0759 11/12/17 0813  BP: (!) 150/96 140/82  Pulse: 90   Temp: 97.9 F (36.6 C)   TempSrc: Oral   SpO2: 98%   Weight: 138 lb 12.8 oz (63 kg)   Height: 6\' 2"  (1.88 m)        Assessment & Plan:  LELDON STEEGE is a 72 y.o. male Medicare annual wellness visit, subsequent  -  - anticipatory guidance as below in AVS, screening labs if needed. Health maintenance items as above in HPI discussed/recommended as applicable.   - no concerning responses on depression, fall, or functional status screening. Any positive responses noted as above. Advanced directives discussed as in CH[facility].   -Appears to for follow-up for colonoscopy, advised to call GI for appointment  -Schedule ophthalmology and dermatology appointments  -Check with pharmacist regarding shingles vaccine and recommended Shingrix if not already received   Hypothyroidism, unspecified type - Plan: TSH  -Check TSH, stable symptoms, continue same dose  Synthroid for now  Hyperglycemia - Plan: Hemoglobin A1c, Comprehensive metabolic panel  -Elevated previously, check A1c, CMP.  Continue exercise, watching diet.  No orders of the defined types were placed in this encounter.  Patient Instructions    Previous polyps were both hyperplastic as well as tubular adenoma.  I suspect they do want to to follow-up in 5 years, so please call gastroenterology for appointment and likely repeat colonoscopy.  Please call your ophthalmologist as well as dermatologist for appointments.  Check with your pharmacist, but you likely are due for the updated shingles vaccine and can usually obtain that without a prescription.  Let me know if I need to send one in.  Continue same dose of thyroid medication.  Thank you for coming in today.  Please let me know if there are questions.  Blood sugar slightly high last year, we will recheck that with a 12-month diabetes screening test today.   IF you received an x-ray today, you will receive an invoice from [facility]. Please contact [facility] at [phone] with questions or concerns regarding your invoice.   IF you received labwork today, you will receive an invoice from [facility]. Please contact [facility] at [phone] with questions or concerns regarding your invoice.   Our billing staff will not be able to assist you with questions regarding bills from these companies.  You will be contacted with the lab results as soon as they are available. The fastest way to get your results is to activate your My Chart account. Instructions are located on the last page of this paperwork. If you have not heard from [place] regarding the results in 2 weeks, please contact this office.       Signed,   Merri Ray, MD [facility].  11/12/17 8:35 AM

## 2017-11-12 NOTE — Patient Instructions (Addendum)
  Previous polyps were both hyperplastic as well as tubular adenoma.  I suspect they do want to to follow-up in 5 years, so please call gastroenterology for appointment and likely repeat colonoscopy.  Please call your ophthalmologist as well as dermatologist for appointments.  Check with your pharmacist, but you likely are due for the updated shingles vaccine and can usually obtain that without a prescription.  Let me know if I need to send one in.  Continue same dose of thyroid medication.  Thank you for coming in today.  Please let me know if there are questions.  Blood sugar slightly high last year, we will recheck that with a 6-month diabetes screening test today.   IF you received an x-ray today, you will receive an invoice from [facility]. Please contact [facility] at [phone] with questions or concerns regarding your invoice.   IF you received labwork today, you will receive an invoice from [place]. Please contact [facility] at [phone] with questions or concerns regarding your invoice.   Our billing staff will not be able to assist you with questions regarding bills from these companies.  You will be contacted with the lab results as soon as they are available. The fastest way to get your results is to activate your My Chart account. Instructions are located on the last page of this paperwork. If you have not heard from [place] regarding the results in 2 weeks, please contact this office.

## 2017-11-13 LAB — COMPREHENSIVE METABOLIC PANEL
ALK PHOS: 76 IU/L (ref 39–117)
ALT: 15 IU/L (ref 0–44)
AST: 26 IU/L (ref 0–40)
Albumin/Globulin Ratio: 1.7 (ref 1.2–2.2)
Albumin: 4.5 g/dL (ref 3.6–4.8)
BILIRUBIN TOTAL: 1.7 mg/dL — AB (ref 0.0–1.2)
BUN/Creatinine Ratio: 13 (ref 10–24)
BUN: 11 mg/dL (ref 8–27)
CHLORIDE: 100 mmol/L (ref 96–106)
CO2: 24 mmol/L (ref 20–29)
CREATININE: 0.82 mg/dL (ref 0.76–1.27)
Calcium: 9.9 mg/dL (ref 8.6–10.2)
GFR calc Af Amer: 104 mL/min/{1.73_m2} (ref >59)
GFR calc non Af Amer: 90 mL/min/{1.73_m2} (ref >59)
GLOBULIN, TOTAL: 2.7 g/dL (ref 1.5–4.5)
GLUCOSE: 93 mg/dL (ref 65–99)
Potassium: 4.7 mmol/L (ref 3.5–5.2)
Sodium: 143 mmol/L (ref 134–144)
Total Protein: 7.2 g/dL (ref 6.0–8.5)

## 2017-11-13 LAB — HEMOGLOBIN A1C
Est. average glucose Bld gHb Est-mCnc: 103 mg/dL
Hgb A1c MFr Bld: 5.2 % (ref 4.8–5.6)

## 2017-11-13 LAB — TSH: TSH: 0.034 u[IU]/mL — ABNORMAL LOW (ref 0.450–4.500)

## 2017-11-15 ENCOUNTER — Telehealth: Payer: Self-pay | Admitting: Gastroenterology

## 2017-11-15 NOTE — Telephone Encounter (Signed)
Please advise, thank you.

## 2017-11-15 NOTE — Telephone Encounter (Signed)
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 DOD for today 8.8.19 is Dr.Danis. Please advise.

## 2017-11-16 NOTE — Telephone Encounter (Signed)
Please see below, he may have the colonoscopy scheduled for Oct, will need a pre-visit appointment too. Please contact patient to get him scheduled, thank you.

## 2017-11-16 NOTE — Telephone Encounter (Signed)
(  For documentation purposes - TA and HP polyps 02/2013, good preparation)  Yes, he can have his surveillance colonoscopy in October 2019.  Dx: History colon polyps. Needs usual insurance pre-certification to define coverage/cost.

## 2017-11-19 ENCOUNTER — Encounter: Payer: Self-pay | Admitting: Gastroenterology

## 2017-11-24 ENCOUNTER — Other Ambulatory Visit: Payer: Self-pay | Admitting: Family Medicine

## 2017-11-24 DIAGNOSIS — E039 Hypothyroidism, unspecified: Secondary | ICD-10-CM

## 2017-11-24 MED ORDER — LEVOTHYROXINE SODIUM 125 MCG PO TABS: 125.0000 ug | ORAL_TABLET | Freq: Every day | ORAL | 1 refills | Status: DC

## 2017-11-24 NOTE — Progress Notes (Signed)
See labs, low tsh - decreased synthroid dose, recheck in 6 weeks.

## 2017-11-26 NOTE — Telephone Encounter (Signed)
Patient was scheduled for pv and colon in Oct 2019.

## 2017-12-18 ENCOUNTER — Telehealth: Payer: Self-pay | Admitting: Family Medicine

## 2017-12-18 DIAGNOSIS — E039 Hypothyroidism, unspecified: Secondary | ICD-10-CM

## 2017-12-18 MED ORDER — LEVOTHYROXINE SODIUM 125 MCG PO TABS
125.0000 ug | ORAL_TABLET | Freq: Every day | ORAL | 1 refills | Status: DC
Start: 2017-12-18 — End: 2018-11-08

## 2017-12-18 NOTE — Telephone Encounter (Signed)
Pt given lab results per notes of Dr. Carlota Raspberry on 11/24/17. Pt verbalized understanding. Unable to document in result notes.

## 2017-12-25 DIAGNOSIS — H524 Presbyopia: Secondary | ICD-10-CM | POA: Diagnosis not present

## 2017-12-25 DIAGNOSIS — H5213 Myopia, bilateral: Secondary | ICD-10-CM | POA: Diagnosis not present

## 2017-12-25 DIAGNOSIS — H52203 Unspecified astigmatism, bilateral: Secondary | ICD-10-CM | POA: Diagnosis not present

## 2017-12-25 DIAGNOSIS — H25013 Cortical age-related cataract, bilateral: Secondary | ICD-10-CM | POA: Diagnosis not present

## 2018-01-02 DIAGNOSIS — Z23 Encounter for immunization: Secondary | ICD-10-CM | POA: Diagnosis not present

## 2018-01-08 ENCOUNTER — Ambulatory Visit (AMBULATORY_SURGERY_CENTER): Payer: Self-pay | Admitting: *Deleted

## 2018-01-08 ENCOUNTER — Encounter: Payer: Self-pay | Admitting: Gastroenterology

## 2018-01-08 VITALS — Ht 74.0 in | Wt 143.8 lb

## 2018-01-08 DIAGNOSIS — Z86010 Personal history of colonic polyps: Secondary | ICD-10-CM

## 2018-01-08 MED ORDER — NA SULFATE-K SULFATE-MG SULF 17.5-3.13-1.6 GM/177ML PO SOLN: 1.0000 | Freq: Once | ORAL | 0 refills | Status: AC

## 2018-01-08 NOTE — Progress Notes (Signed)
No egg or soy allergy known to patient  No issues with past sedation with any surgeries  or procedures, no intubation problems  No diet pills per patient No home 02 use per patient  No blood thinners per patient  Pt denies issues with constipation  No A fib or A flutter  EMMI video sent to pt's e mail pt declined   

## 2018-01-15 DIAGNOSIS — L570 Actinic keratosis: Secondary | ICD-10-CM | POA: Diagnosis not present

## 2018-01-15 DIAGNOSIS — D2371 Other benign neoplasm of skin of right lower limb, including hip: Secondary | ICD-10-CM | POA: Diagnosis not present

## 2018-01-15 DIAGNOSIS — D225 Melanocytic nevi of trunk: Secondary | ICD-10-CM | POA: Diagnosis not present

## 2018-01-15 DIAGNOSIS — L720 Epidermal cyst: Secondary | ICD-10-CM | POA: Diagnosis not present

## 2018-01-15 DIAGNOSIS — Z85820 Personal history of malignant melanoma of skin: Secondary | ICD-10-CM | POA: Diagnosis not present

## 2018-01-15 DIAGNOSIS — Z85828 Personal history of other malignant neoplasm of skin: Secondary | ICD-10-CM | POA: Diagnosis not present

## 2018-01-23 ENCOUNTER — Encounter: Payer: Self-pay | Admitting: Gastroenterology

## 2018-01-23 ENCOUNTER — Ambulatory Visit (AMBULATORY_SURGERY_CENTER): Payer: 59 | Admitting: Gastroenterology

## 2018-01-23 VITALS — BP 135/82 | HR 75 | Temp 97.7°F | Resp 13 | Ht 74.0 in | Wt 143.0 lb

## 2018-01-23 DIAGNOSIS — D125 Benign neoplasm of sigmoid colon: Secondary | ICD-10-CM

## 2018-01-23 DIAGNOSIS — D128 Benign neoplasm of rectum: Secondary | ICD-10-CM | POA: Diagnosis not present

## 2018-01-23 DIAGNOSIS — Z86010 Personal history of colonic polyps: Secondary | ICD-10-CM

## 2018-01-23 DIAGNOSIS — K635 Polyp of colon: Secondary | ICD-10-CM

## 2018-01-23 MED ORDER — SODIUM CHLORIDE 0.9 % IV SOLN: 500.0000 mL | Freq: Once | INTRAVENOUS | Status: DC

## 2018-01-23 NOTE — Progress Notes (Signed)
Pt's states no medical or surgical changes since previsit or office visit. 

## 2018-01-23 NOTE — Patient Instructions (Signed)
YOU HAD AN ENDOSCOPIC PROCEDURE TODAY AT THE [facility]:   Refer to the procedure report that was given to you for any specific questions about what was found during the examination.  If the procedure report does not answer your questions, please call your gastroenterologist to clarify.  If you requested that your care partner not be given the details of your procedure findings, then the procedure report has been included in a sealed envelope for you to review at your convenience later.  YOU SHOULD EXPECT: Some feelings of bloating in the abdomen. Passage of more gas than usual.  Walking can help get rid of the air that was put into your GI tract during the procedure and reduce the bloating. If you had a lower endoscopy (such as a colonoscopy or flexible sigmoidoscopy) you may notice spotting of blood in your stool or on the toilet paper. If you underwent a bowel prep for your procedure, you may not have a normal bowel movement for a few days.  Please Note:  You might notice some irritation and congestion in your nose or some drainage.  This is from the oxygen used during your procedure.  There is no need for concern and it should clear up in a day or so.  SYMPTOMS TO REPORT IMMEDIATELY:   Following lower endoscopy (colonoscopy or flexible sigmoidoscopy):  Excessive amounts of blood in the stool  Significant tenderness or worsening of abdominal pains  Swelling of the abdomen that is new, acute  Fever of 100F or higher   Please see handout given to you on Polyps.  For urgent or emergent issues, a gastroenterologist can be reached at any hour by calling [phone].   DIET:  We do recommend a small meal at first, but then you may proceed to your regular diet.  Drink plenty of fluids but you should avoid alcoholic beverages for 24 hours.  ACTIVITY:  You should plan to take it easy for the rest of today and you should NOT DRIVE or use heavy machinery until tomorrow (because of  the sedation medicines used during the test).    FOLLOW UP: Our staff will call the number listed on your records the next business day following your procedure to check on you and address any questions or concerns that you may have regarding the information given to you following your procedure. If we do not reach you, we will leave a message.  However, if you are feeling well and you are not experiencing any problems, there is no need to return our call.  We will assume that you have returned to your regular daily activities without incident.  If any biopsies were taken you will be contacted by phone or by letter within the next 1-3 weeks.  Please call us at [phone] if you have not heard about the biopsies in 3 weeks.    SIGNATURES/CONFIDENTIALITY: You and/or your care partner have signed paperwork which will be entered into your electronic medical record.  These signatures attest to the fact that that the information above on your After Visit Summary has been reviewed and is understood.  Full responsibility of the confidentiality of this discharge information lies with you and/or your care-partner.  Thank you for letting us take care of your healthcare needs today. 

## 2018-01-23 NOTE — Progress Notes (Signed)
To recovery, report to RN, VSS. 

## 2018-01-23 NOTE — Op Note (Signed)
[place] Patient Name: Tyler Randall Procedure Date: 01/23/2018 9:35 AM MRN: [id number] Endoscopist: Mallie Mussel L. Loletha Carrow , MD Age: 72 Referring MD:  Date of Birth: [birth year] Gender: Male Account #: [id number] Procedure:                Colonoscopy Indications:              Surveillance: Personal history of adenomatous                            polyps (<27mm) on last colonoscopy 5 years ago Medicines:                Monitored Anesthesia Care Procedure:                Pre-Anesthesia Assessment:                           - Prior to the procedure, a History and Physical                            was performed, and patient medications and                            allergies were reviewed. The patient's tolerance of                            previous anesthesia was also reviewed. The risks                            and benefits of the procedure and the sedation                            options and risks were discussed with the patient.                            All questions were answered, and informed consent                            was obtained. Prior Anticoagulants: The patient has                            taken no previous anticoagulant or antiplatelet                            agents. ASA Grade Assessment: II - A patient with                            mild systemic disease. After reviewing the risks                            and benefits, the patient was deemed in                            satisfactory condition to undergo the procedure.  After obtaining informed consent, the colonoscope                            was passed under direct vision. Throughout the                            procedure, the patient's blood pressure, pulse, and                            oxygen saturations were monitored continuously. The                            Model CF-HQ190L [id number]) scope was introduced                            through the anus  and advanced to the the cecum,                            identified by appendiceal orifice and ileocecal                            valve. The colonoscopy was performed without                            difficulty. The patient tolerated the procedure                            well. The quality of the bowel preparation was                            good. The ileocecal valve, appendiceal orifice, and                            rectum were photographed. The quality of the bowel                            preparation was evaluated using the BBPS [facility]                            Bowel Preparation Scale) with scores of: Right                            Colon = 2, Transverse Colon = 2 and Left Colon = 2.                            The total BBPS score equals 6. Scope In: 9:51:45 AM Scope Out: 10:13:40 AM Scope Withdrawal Time: 0 hours 19 minutes 45 seconds  Total Procedure Duration: 0 hours 21 minutes 55 seconds  Findings:                 The perianal and digital rectal examinations were                            normal.  Diverticula were found in the left colon.                           A 3 mm polyp was found in the sigmoid colon. The                            polyp was sessile and on the edge of a diverticular                            opening. The polyp was removed with a cold snare.                            Resection and retrieval were complete.                           A 12-15 mm area of slightly-raised, frond-like                            tissue was found in the distal rectum, just above                            the dentate line in the anterior position. The                            abnormal mucosa was removed with a piecemeal                            technique using a cold biopsy forceps. Resection                            and retrieval were complete.                           The exam was otherwise without abnormality on                             direct and retroflexion views. Complications:            No immediate complications. Estimated Blood Loss:     Estimated blood loss was minimal. Impression:               - Diverticulosis in the left colon.                           - One 3 mm polyp in the sigmoid colon, removed with                            a cold snare. Resected and retrieved.                           - One 12-15 mm mucosal abnormality in the rectum,                            removed piecemeal using  a cold biopsy forceps.                            Resected and retrieved.                           - The examination was otherwise normal on direct                            and retroflexion views. Recommendation:           - Patient has a contact number available for                            emergencies. The signs and symptoms of potential                            delayed complications were discussed with the                            patient. Return to normal activities tomorrow.                            Written discharge instructions were provided to the                            patient.                           - Resume previous diet.                           - Continue present medications.                           - Await pathology results.                           - Repeat colonoscopy is recommended for                            surveillance. The colonoscopy date will be                            determined after pathology results from today's                            exam become available for review.  L. Loletha Carrow, MD 01/23/2018 10:23:44 AM This report has been signed electronically.

## 2018-01-24 ENCOUNTER — Telehealth: Payer: Self-pay

## 2018-01-24 NOTE — Telephone Encounter (Signed)
Called [phone] and left a messaged we tried to reach pt for a follow up call. maw

## 2018-01-24 NOTE — Telephone Encounter (Signed)
  Follow up Call-  Call Tyler Randall number 01/23/2018  Post procedure Call Tyler Randall phone  # [phone]  Permission to leave phone message Yes  Some recent data might be hidden     Patient questions:  Do you have a fever, pain , or abdominal swelling? No. Pain Score  0 *  Have you tolerated food without any problems? Yes.    Have you been able to return to your normal activities? Yes.    Do you have any questions about your discharge instructions: Diet   No. Medications  No. Follow up visit  No.  Do you have questions or concerns about your Care? No.  Actions: * If pain score is 4 or above: No action needed, pain <4.

## 2018-01-29 ENCOUNTER — Telehealth: Payer: Self-pay | Admitting: Family Medicine

## 2018-01-29 ENCOUNTER — Ambulatory Visit: Payer: Medicare Other

## 2018-01-29 ENCOUNTER — Encounter: Payer: Self-pay | Admitting: Gastroenterology

## 2018-01-29 DIAGNOSIS — E039 Hypothyroidism, unspecified: Secondary | ICD-10-CM

## 2018-01-29 NOTE — Telephone Encounter (Signed)
Pt came in to have labs done due to medication change last vist.SYNTHROID, LEVOTHROID) 125 MCG tab. Patient wants Dr,Greene to put in orders for labs and would like to come in on Monday  10.28.2019

## 2018-01-29 NOTE — Telephone Encounter (Signed)
Patient came to the office for lab work to check thyroid level because medication was changed at last office visit. The patient would like orders to be placed, he will return on 02/04/2018. Please advise, thank you.

## 2018-01-29 NOTE — Telephone Encounter (Signed)
Order placed

## 2018-01-31 ENCOUNTER — Ambulatory Visit (INDEPENDENT_AMBULATORY_CARE_PROVIDER_SITE_OTHER): Payer: Medicare Other | Admitting: Family Medicine

## 2018-01-31 DIAGNOSIS — E039 Hypothyroidism, unspecified: Secondary | ICD-10-CM | POA: Diagnosis not present

## 2018-01-31 NOTE — Progress Notes (Signed)
Lab visit

## 2018-02-01 LAB — TSH+FREE T4
FREE T4: 1.61 ng/dL (ref 0.82–1.77)
TSH: 0.222 u[IU]/mL — AB (ref 0.450–4.500)

## 2018-02-16 ENCOUNTER — Encounter: Payer: Self-pay | Admitting: Radiology

## 2018-04-21 ENCOUNTER — Other Ambulatory Visit: Payer: Self-pay | Admitting: Family Medicine

## 2018-04-21 DIAGNOSIS — F32A Depression, unspecified: Secondary | ICD-10-CM

## 2018-04-21 DIAGNOSIS — F329 Major depressive disorder, single episode, unspecified: Secondary | ICD-10-CM

## 2018-04-22 NOTE — Telephone Encounter (Signed)
Requested medication (s) are due for refill today: Yes  Requested medication (s) are on the active medication list: Yes  Last refill:  11/09/16  Future visit scheduled: No  Notes to clinic:  Unable to refill per protocol     Requested Prescriptions  Pending Prescriptions Disp Refills   mirtazapine (REMERON) 15 MG tablet [Pharmacy Med Name: MIRTAZAPINE 15 MG TABLET] 90 tablet 3    Sig: TAKE 1 TABLET (15 MG TOTAL) BY MOUTH AT BEDTIME.     Psychiatry: Antidepressants - mirtazapine Failed - 04/21/2018  9:30 AM      Failed - Triglycerides in normal range and within 360 days    Triglycerides  Date Value Ref Range Status  11/09/2016 64 0 - 149 mg/dL Final         Failed - Total Cholesterol in normal range and within 360 days    Cholesterol, Total  Date Value Ref Range Status  11/09/2016 181 100 - 199 mg/dL Final         Failed - WBC in normal range and within 360 days    WBC  Date Value Ref Range Status  04/28/2015 4.7 4.6 - 10.2 K/uL Final  05/28/2014 11.2 (H) 4.0 - 10.5 K/uL Final         Passed - AST in normal range and within 360 days    AST  Date Value Ref Range Status  11/12/2017 26 0 - 40 IU/L Final         Passed - ALT in normal range and within 360 days    ALT  Date Value Ref Range Status  11/12/2017 15 0 - 44 IU/L Final         Passed - Valid encounter within last 6 months    Recent Outpatient Visits          2 months ago Hypothyroidism, unspecified type   [facility] [place], MD   5 months ago Medicare annual wellness visit, subsequent   [facility] [place], MD   10 months ago Erectile dysfunction, unspecified erectile dysfunction type   [facility], Ranell Patrick, MD   1 year ago Annual physical exam   [facility], Ranell Patrick, MD   1 year ago Metatarsalgia of left foot   [facility], Gelene Mink, [place]

## 2018-04-25 ENCOUNTER — Other Ambulatory Visit: Payer: Self-pay | Admitting: Emergency Medicine

## 2018-04-25 NOTE — Telephone Encounter (Signed)
Refilled, but needs OV to discuss further RF's. Please schedule appt.  Also needs follow up of low TSH.  See notes from October.

## 2018-04-25 NOTE — Telephone Encounter (Signed)
pls advise

## 2018-07-23 ENCOUNTER — Other Ambulatory Visit: Payer: Self-pay | Admitting: Family Medicine

## 2018-07-23 DIAGNOSIS — F329 Major depressive disorder, single episode, unspecified: Secondary | ICD-10-CM

## 2018-07-23 DIAGNOSIS — F32A Depression, unspecified: Secondary | ICD-10-CM

## 2018-07-23 NOTE — Telephone Encounter (Signed)
Patient is requesting a refill of the following medications: Requested Prescriptions   Pending Prescriptions Disp Refills  . mirtazapine (REMERON) 15 MG tablet [Pharmacy Med Name: MIRTAZAPINE 15 MG TABLET] 90 tablet 0    Sig: TAKE 1 TABLET BY MOUTH EVERYDAY AT BEDTIME    Date of patient request: 07/23/2018 Last office visit: 11/12/2016 Date of last refill: 04/25/2018 Last refill amount: 90 tab  Follow up time period per chart: Return in about 6 months (around 05/15/2018) for Thyroid.Marland Kitchen

## 2018-07-23 NOTE — Telephone Encounter (Signed)
Due for follow-up to discuss this medication as well as repeat TSH.  Will refill, but please schedule telemedicine visit to discuss plan further.

## 2018-07-23 NOTE — Telephone Encounter (Signed)
Can pt have refill on this med?

## 2018-08-16 ENCOUNTER — Encounter: Payer: Self-pay | Admitting: Gastroenterology

## 2018-09-13 ENCOUNTER — Other Ambulatory Visit: Payer: Self-pay | Admitting: Family Medicine

## 2018-09-13 DIAGNOSIS — Z0000 Encounter for general adult medical examination without abnormal findings: Secondary | ICD-10-CM

## 2018-09-17 NOTE — Telephone Encounter (Signed)
I have spoke with Tyler Randall and he states that he only comes in for his annual CPE and that's when he gets all of his medications refilled. His last cpe was 11/12/2017 and I scheduled him a cpe for 11/14/2018. He is wondering if you would send him enough to last until that appt. He states he has enough to get him through the end of July.

## 2018-09-18 MED ORDER — MIRTAZAPINE 15 MG PO TABS: ORAL_TABLET | ORAL | 0 refills | Status: DC

## 2018-09-18 NOTE — Addendum Note (Signed)
Addended by: Merri Ray R on: 09/18/2018 07:14 PM   Modules accepted: Orders

## 2018-09-18 NOTE — Telephone Encounter (Signed)
Noted.  However his thyroid test was abnormal in October with plan for either adjustment of medications or close monitoring with repeat testing in 6 weeks. - here was that message sen by Mychart: "TSH was again slightly low but stable from previous reading. Could try slightly lower dose of Synthroid, or remain at same dose if no new symptoms and recheck levels within the next 6 weeks. Let me know what you would like to do."  I do not see where he had repeat testing performed, and would recommend getting that done as soon as possible. I will place lab only order for thyroid.   Ok to refill remeron until wellness visit. I placed another refill.

## 2018-11-08 ENCOUNTER — Ambulatory Visit (INDEPENDENT_AMBULATORY_CARE_PROVIDER_SITE_OTHER): Payer: Medicare HMO

## 2018-11-08 ENCOUNTER — Ambulatory Visit (INDEPENDENT_AMBULATORY_CARE_PROVIDER_SITE_OTHER): Payer: Medicare HMO | Admitting: Family Medicine

## 2018-11-08 ENCOUNTER — Encounter: Payer: Self-pay | Admitting: Family Medicine

## 2018-11-08 ENCOUNTER — Other Ambulatory Visit: Payer: Self-pay

## 2018-11-08 ENCOUNTER — Telehealth: Payer: Self-pay | Admitting: Family Medicine

## 2018-11-08 VITALS — BP 139/83 | HR 82 | Temp 96.0°F | Resp 12 | Wt 137.0 lb

## 2018-11-08 DIAGNOSIS — R0789 Other chest pain: Secondary | ICD-10-CM

## 2018-11-08 DIAGNOSIS — R0781 Pleurodynia: Secondary | ICD-10-CM

## 2018-11-08 DIAGNOSIS — G4700 Insomnia, unspecified: Secondary | ICD-10-CM | POA: Diagnosis not present

## 2018-11-08 DIAGNOSIS — Z87891 Personal history of nicotine dependence: Secondary | ICD-10-CM

## 2018-11-08 DIAGNOSIS — R918 Other nonspecific abnormal finding of lung field: Secondary | ICD-10-CM

## 2018-11-08 DIAGNOSIS — H6123 Impacted cerumen, bilateral: Secondary | ICD-10-CM | POA: Diagnosis not present

## 2018-11-08 DIAGNOSIS — E039 Hypothyroidism, unspecified: Secondary | ICD-10-CM

## 2018-11-08 DIAGNOSIS — J90 Pleural effusion, not elsewhere classified: Secondary | ICD-10-CM

## 2018-11-08 DIAGNOSIS — J439 Emphysema, unspecified: Secondary | ICD-10-CM | POA: Diagnosis not present

## 2018-11-08 DIAGNOSIS — R17 Unspecified jaundice: Secondary | ICD-10-CM

## 2018-11-08 MED ORDER — LEVOTHYROXINE SODIUM 125 MCG PO TABS: 125.0000 ug | ORAL_TABLET | Freq: Every day | ORAL | 0 refills | Status: DC

## 2018-11-08 MED ORDER — LEVOFLOXACIN 500 MG PO TABS: 500.0000 mg | ORAL_TABLET | Freq: Every day | ORAL | 0 refills | Status: DC

## 2018-11-08 NOTE — Progress Notes (Signed)
Subjective:    Patient ID: Tyler Randall, male    DOB: Oct 22, 1945, 73 y.o.   MRN: [id number]  HPI Tyler Randall is a 73 y.o. male Presents today for: Chief Complaint  Patient presents with   Rib Injury    Pain in the rt rib post in May of 2019 Pain has been worse in the past 2 weeks.  pain is worse with cough, burp and movement. Patient stated he has an upcoming appt for a physical in a few weeks   Lamy stated his is not having an issus now but just want to make you aware.Bite on left foot which may of been the cause of rib pain   Withdrawal    Patient has been having withdraw after stopping the remeron   Cerumen Impaction    would like to get ears cleaned. Patient was informed may or may not be able to cover all his issus today.   Last seen in August 2019 for an annual exam..  Here with multiple concerns as above. Wellness exam next week.   R sided Rib pain: similar pain in may 2019, after fall in [place]. No swelling, no bruising. No XR. Went away in 6 weeks.  Same pain 2 weeks ago - NKI, no change in activity.  No swelling/bruising.  No N/V.  No pain meds. Tried ice improved, heating pad worse.  Slight cough - reflex d/t pain. Catching pain at times.  No hemoptysis.  Tobacco - 50 pack yr hx, quit 01/2016.  No n/v/abd pain.  No dysuria/hematuria.  No fever, no dyspnea.   ? Cerumen impaction: Tries to pop ear at times. Hearing ok, left ear worse. No qtips, but uses kleenex in ear.   Insomnia: Previously treated with Remeron.  Refill request in April was granted but stressed importance of follow-up. Appears rx sent for # 90 on 09/18/18 to [place].  Quit taking cold [place] few weeks ago - few days later felt bad. High anxiety and genralized not feeling well. Thought was w/d from remeron. Feeling better past week or so - ok today. Still some decreased appetite. Sleeping ok without remeron.not feeling depressed.  Depression screen [facility] 2/9  11/08/2018 11/12/2017 06/12/2017 11/09/2016 10/25/2016  Decreased Interest 0 0 0 0 0  Down, Depressed, Hopeless 0 0 0 0 0  PHQ - 2 Score 0 0 0 0 0     Hypothyroidism: Lab Results  Component Value Date   TSH 0.222 (L) 01/31/2018  TSH of 0.034 in August 2019. Synthroid was decreased in August to 125 mcg once per day.  Repeat testing as above.  Option of slightly lower dose of Synthroid or remaining the same dose with close follow-up in 6 weeks.  Has not had follow-up since October 2019.  Denies skin/hair changes, no heart palpitations. No new mood changes.  Wt Readings from Last 3 Encounters:  11/08/18 137 lb (62.1 kg)  01/23/18 143 lb (64.9 kg)  01/08/18 143 lb 12.8 oz (65.2 kg)     History of prostate cancer, treated by urologist Dr. Risa Grill, then Dr. Jeffie Pollock with seed treatment.  Cialis erectile dysfunction.  Planned on yearly follow-ups with urology.  Last note in April 2019.   Elevated bilirubin: 1.7 in August 2019, previously 1.3-1.4.  Overall stable, will be rechecked today. No n/v/abd pain, no yellowing of skin/eyes.     Patient Active Problem List   Diagnosis Date Noted   Prostate cancer ([place])  03/27/2014   Melanoma of skin, site unspecified 10/06/2013   Unspecified hypothyroidism 10/06/2013   Unspecified vitamin D deficiency 10/06/2013   Thyroid condition 11/28/2011   Past Medical History:  Diagnosis Date   Allergy    Depression    Prostate cancer ([place])    Skin cancer    melanoma   Thyroid disease    hypothyroidism   Past Surgical History:  Procedure Laterality Date   COLONOSCOPY  2014   melanoma excision from back      POLYPECTOMY  2014   PROSTATE SURGERY     radiation seed implants     TONSILLECTOMY     Allergies  Allergen Reactions   Latex     With prolonged exposure   Sulfa Antibiotics     Not sure reaction, started in infancy   Prior to Admission medications   Medication Sig Start Date End Date Taking? Authorizing Provider    Cholecalciferol (VITAMIN D PO) Take by mouth daily.   Yes [provider]  levothyroxine (SYNTHROID, LEVOTHROID) 125 MCG tablet Take 1 tablet (125 mcg total) by mouth daily before breakfast. 12/18/17  Yes [name], MD  Multiple Vitamins-Minerals (CENTRUM SILVER PO) Take 1 tablet by mouth.   Yes [provider]  tadalafil (CIALIS) 5 MG tablet TAKE 1 TABLET (5 MG TOTAL) BY MOUTH AS NEEDED. 09/13/18  Yes [name], MD   Social History   Socioeconomic History   Marital status: Divorced    Spouse name: Not on file   Number of children: Not on file   Years of education: Not on file   Highest education level: Not on file  Occupational History   Not on file  Social Needs   Financial resource strain: Not on file   Food insecurity    Worry: Not on file    Inability: Not on file   Transportation needs    Medical: Not on file    Non-medical: Not on file  Tobacco Use   Smoking status: Former Smoker    Packs/day: 0.50    Quit date: 02/03/2016    Years since quitting: 2.7   Smokeless tobacco: Never Used  Substance and Sexual Activity   Alcohol use: Yes    Alcohol/week: 20.0 standard drinks    Types: 20 Cans of beer per week   Drug use: No   Sexual activity: Not on file  Lifestyle   Physical activity    Days per week: Not on file    Minutes per session: Not on file   Stress: Not on file  Relationships   Social connections    Talks on phone: Not on file    Gets together: Not on file    Attends religious service: Not on file    Active member of club or organization: Not on file    Attends meetings of clubs or organizations: Not on file    Relationship status: Not on file   Intimate partner violence    Fear of current or ex partner: Not on file    Emotionally abused: Not on file    Physically abused: Not on file    Forced sexual activity: Not on file  Other Topics Concern   Not on file  Social History Narrative   Not on file     Review of Systems Per HPI.     Objective:   Physical Exam Vitals signs reviewed.  Constitutional:      Appearance: He is well-developed.  Comments: Thin appearance. Nontoxic.   HENT:     Head: Normocephalic and atraumatic.     Right Ear: External ear normal. There is impacted cerumen.     Left Ear: External ear normal. There is impacted cerumen.  Eyes:     Pupils: Pupils are equal, round, and reactive to light.  Neck:     Vascular: No carotid bruit or JVD.  Cardiovascular:     Rate and Rhythm: Normal rate and regular rhythm.     Heart sounds: Normal heart sounds. No murmur.     Comments: Borderline tachycardia on exam approx 80-90.  Pulmonary:     Effort: Pulmonary effort is normal.     Breath sounds: Normal breath sounds. No rales.  Chest:     Chest wall: No tenderness.  Skin:    General: Skin is warm and dry.  Neurological:     Mental Status: He is alert and oriented to person, place, and time.    Vitals:   11/08/18 1511 11/08/18 1621  BP: (!) 158/102 139/83  Pulse: 64 82  Resp: 12   Temp: (!) 96 F (35.6 C)   TempSrc: Oral   SpO2: 96%   Weight: 137 lb (62.1 kg)    Dg Chest 1 View  Result Date: 11/08/2018 CLINICAL DATA:  Right lower rib pain for 2 weeks. EXAM: CHEST  1 VIEW COMPARISON:  05/28/2014 FINDINGS: Single lateral radiograph shows hyperinflated lungs and changes of emphysema. Right middle lobe and anterior right lower lobe airspace opacity is identified along with a small amount of loculated pleural fluid. The visualized osseous structures are unremarkable. IMPRESSION: 1. Right middle lobe and anterior right lower lobe airspace opacity which, in the acute setting may represent pneumonia. Recommend follow-up imaging to ensure resolution and to rule out underlying malignancy in this patient who is at increased risk for lung cancer. 2.  Emphysema (ICD10-J43.9). Electronically Signed   By: Kerby Moors M.D.   On: 11/08/2018 16:45   Dg Ribs Unilateral  W/chest Right  Result Date: 11/08/2018 CLINICAL DATA:  RIGHT lower rib pain for 2 weeks. EXAM: RIGHT RIBS AND CHEST - 3+ VIEW COMPARISON:  Chest radiograph 05/28/2014 FINDINGS: Along the RIGHT lateral inferior chest wall, there is a ill-defined process involving the pleura and parenchyma measuring 7.7 x 3.4 cm which is new from comparison exam. There is a new RIGHT pleural effusion. LEFT lung is clear. IMPRESSION: Ill-defined chest wall process on the RIGHT with differential including malignancy, posttraumatic change or pneumonia with parapneumonic effusion. High concern for malignancy. Recommend CT of the chest with contrast for further evaluation. These results will be called to the ordering clinician or representative by the Radiologist Assistant, and communication documented in the PACS or zVision Dashboard. Electronically Signed   By: Suzy Bouchard M.D.   On: 11/08/2018 16:42       Assessment & Plan:    ESTEVON FLUKE is a 73 y.o. male Hypothyroidism, unspecified type - Plan: T4 AND TSH, levothyroxine (SYNTHROID) 125 MCG tablet  -Unfortunately lost to follow-up from last year TSH level.  Repeat TSH, free T4, continue same dose Synthroid for now but likely may need lower dosing.  Pleural effusion, not elsewhere classified - Plan: CT Chest W Contrast, levofloxacin (LEVAQUIN) 500 MG tablet Pleural effusion, right - Plan: levofloxacin (LEVAQUIN) 500 MG tablet Other nonspecific abnormal finding of lung field - Plan: levofloxacin (LEVAQUIN) 500 MG tablet Rib pain on right side - Plan: CMP14+EGFR, DG Ribs Unilateral W/Chest Right, DG Chest  1 View History of tobacco use - Plan: DG Ribs Unilateral W/Chest Right, DG Chest 1 View  -opacity with new pleural effusion.  Concern for possible malignancy, but with recent onset of symptoms  And initial malaise, will treat for possible pneumonia with Levaquin first and check CT. recheck in 1 week, sooner if worsening, ER precautions discussed  Elevated  bilirubin,   - Plan: CMP14+EGFR  Insomnia, unspecified type  -Denies symptoms off Remeron.  Decreased appetite may be in part to stopping Remeron.  However will evaluate further at follow up visit.    Bilateral impacted cerumen - Plan: Ear wax removal  - removed in office - repeat exam clear, no complications.    Meds ordered this encounter  Medications   levofloxacin (LEVAQUIN) 500 MG tablet    Sig: Take 1 tablet (500 mg total) by mouth daily.    Dispense:  10 tablet    Refill:  0   levothyroxine (SYNTHROID) 125 MCG tablet    Sig: Take 1 tablet (125 mcg total) by mouth daily before breakfast.    Dispense:  90 tablet    Refill:  0   Patient Instructions     If insomnia returns, can try melatonin, or restart remeron if needed.  See other info below on sleep.   I will recheck liver and thyroid tests today, but you may be on too much meds based on reading last year.   There were some concerning findings on your x-ray today.  Right middle and lower lobes with possible infection but that is unlikely with your symptoms.  I will check a CT scan to evaluate that area further, but will start Levaquin for now in case that may be infectious.  Follow-up 1 week, but if any new shortness of breath fevers or worsening symptoms be seen here or emergency room sooner.   Insomnia Insomnia is a sleep disorder that makes it difficult to fall asleep or stay asleep. Insomnia can cause fatigue, low energy, difficulty concentrating, mood swings, and poor performance at work or school. There are three different ways to classify insomnia:  Difficulty falling asleep.  Difficulty staying asleep.  Waking up too early in the morning. Any type of insomnia can be long-term (chronic) or short-term (acute). Both are common. Short-term insomnia usually lasts for three months or less. Chronic insomnia occurs at least three times a week for longer than three months. What are the causes? Insomnia may be  caused by another condition, situation, or substance, such as:  Anxiety.  Certain medicines.  Gastroesophageal reflux disease (GERD) or other gastrointestinal conditions.  Asthma or other breathing conditions.  Restless legs syndrome, sleep apnea, or other sleep disorders.  Chronic pain.  Menopause.  Stroke.  Abuse of alcohol, tobacco, or illegal drugs.  Mental health conditions, such as depression.  Caffeine.  Neurological disorders, such as Alzheimer's disease.  An overactive thyroid (hyperthyroidism). Sometimes, the cause of insomnia may not be known. What increases the risk? Risk factors for insomnia include:  Gender. Women are affected more often than men.  Age. Insomnia is more common as you get older.  Stress.  Lack of exercise.  Irregular work schedule or working night shifts.  Traveling between different time zones.  Certain medical and mental health conditions. What are the signs or symptoms? If you have insomnia, the main symptom is having trouble falling asleep or having trouble staying asleep. This may lead to other symptoms, such as:  Feeling fatigued or having low energy.  Feeling  nervous about going to sleep.  Not feeling rested in the morning.  Having trouble concentrating.  Feeling irritable, anxious, or depressed. How is this diagnosed? This condition may be diagnosed based on:  Your symptoms and medical history. Your health care provider may ask about: ? Your sleep habits. ? Any medical conditions you have. ? Your mental health.  A physical exam. How is this treated? Treatment for insomnia depends on the cause. Treatment may focus on treating an underlying condition that is causing insomnia. Treatment may also include:  Medicines to help you sleep.  Counseling or therapy.  Lifestyle adjustments to help you sleep better. Follow these instructions at home: Eating and drinking   Limit or avoid alcohol, caffeinated  beverages, and cigarettes, especially close to bedtime. These can disrupt your sleep.  Do not eat a large meal or eat spicy foods right before bedtime. This can lead to digestive discomfort that can make it hard for you to sleep. Sleep habits   Keep a sleep diary to help you and your health care provider figure out what could be causing your insomnia. Write down: ? When you sleep. ? When you wake up during the night. ? How well you sleep. ? How rested you feel the next day. ? Any side effects of medicines you are taking. ? What you eat and drink.  Make your bedroom a dark, comfortable place where it is easy to fall asleep. ? Put up shades or blackout curtains to block light from outside. ? Use a white noise machine to block noise. ? Keep the temperature cool.  Limit screen use before bedtime. This includes: ? Watching TV. ? Using your smartphone, tablet, or computer.  Stick to a routine that includes going to bed and waking up at the same times every day and night. This can help you fall asleep faster. Consider making a quiet activity, such as reading, part of your nighttime routine.  Try to avoid taking naps during the day so that you sleep better at night.  Get out of bed if you are still awake after 15 minutes of trying to sleep. Keep the lights down, but try reading or doing a quiet activity. When you feel sleepy, go back to bed. General instructions  Take over-the-counter and prescription medicines only as told by your health care provider.  Exercise regularly, as told by your health care provider. Avoid exercise starting several hours before bedtime.  Use relaxation techniques to manage stress. Ask your health care provider to suggest some techniques that may work well for you. These may include: ? Breathing exercises. ? Routines to release muscle tension. ? Visualizing peaceful scenes.  Make sure that you drive carefully. Avoid driving if you feel very sleepy.  Keep  all follow-up visits as told by your health care provider. This is important. Contact a health care provider if:  You are tired throughout the day.  You have trouble in your daily routine due to sleepiness.  You continue to have sleep problems, or your sleep problems get worse. Get help right away if:  You have serious thoughts about hurting yourself or someone else. If you ever feel like you may hurt yourself or others, or have thoughts about taking your own life, get help right away. You can go to your nearest emergency department or call:  Your local emergency services (911 in the [place]  A suicide crisis helpline, such as the [place] at 859-792-9984. This is open 24 hours  a day. Summary  Insomnia is a sleep disorder that makes it difficult to fall asleep or stay asleep.  Insomnia can be long-term (chronic) or short-term (acute).  Treatment for insomnia depends on the cause. Treatment may focus on treating an underlying condition that is causing insomnia.  Keep a sleep diary to help you and your health care provider figure out what could be causing your insomnia. This information is not intended to replace advice given to you by your health care provider. Make sure you discuss any questions you have with your health care provider. Document Released: 03/24/2000 Document Revised: 03/09/2017 Document Reviewed: 01/04/2017 Elsevier Patient Education  [employer].   If you have lab work done today you will be contacted with your lab results within the next 2 weeks.  If you have not heard from [place] then please contact us. The fastest way to get your results is to register for My Chart.   IF you received an x-ray today, you will receive an invoice from [facility]. Please contact [facility] at [phone] with questions or concerns regarding your invoice.   IF you received labwork today, you will receive an invoice from [facility].  Please contact [facility] at [phone] with questions or concerns regarding your invoice.   Our billing staff will not be able to assist you with questions regarding bills from these companies.  You will be contacted with the lab results as soon as they are available. The fastest way to get your results is to activate your My Chart account. Instructions are located on the last page of this paperwork. If you have not heard from [place] regarding the results in 2 weeks, please contact this office.         If you have lab work done today you will be contacted with your lab results within the next 2 weeks.  If you have not heard from [place] then please contact us. The fastest way to get your results is to register for My Chart.   IF you received an x-ray today, you will receive an invoice from [facility]. Please contact [facility] at [phone] with questions or concerns regarding your invoice.   IF you received labwork today, you will receive an invoice from [facility]. Please contact [facility] at [phone] with questions or concerns regarding your invoice.   Our billing staff will not be able to assist you with questions regarding bills from these companies.  You will be contacted with the lab results as soon as they are available. The fastest way to get your results is to activate your My Chart account. Instructions are located on the last page of this paperwork. If you have not heard from [place] regarding the results in 2 weeks, please contact this office.       Signed,   Merri Ray, MD [facility] [place].  11/10/18 9:09 AM

## 2018-11-08 NOTE — Patient Instructions (Addendum)
If insomnia returns, can try melatonin, or restart remeron if needed.  See other info below on sleep.   I will recheck liver and thyroid tests today, but you may be on too much meds based on reading last year.   There were some concerning findings on your x-ray today.  Right middle and lower lobes with possible infection but that is unlikely with your symptoms.  I will check a CT scan to evaluate that area further, but will start Levaquin for now in case that may be infectious.  Follow-up 1 week, but if any new shortness of breath fevers or worsening symptoms be seen here or emergency room sooner.   Insomnia Insomnia is a sleep disorder that makes it difficult to fall asleep or stay asleep. Insomnia can cause fatigue, low energy, difficulty concentrating, mood swings, and poor performance at work or school. There are three different ways to classify insomnia:  Difficulty falling asleep.  Difficulty staying asleep.  Waking up too early in the morning. Any type of insomnia can be long-term (chronic) or short-term (acute). Both are common. Short-term insomnia usually lasts for three months or less. Chronic insomnia occurs at least three times a week for longer than three months. What are the causes? Insomnia may be caused by another condition, situation, or substance, such as:  Anxiety.  Certain medicines.  Gastroesophageal reflux disease (GERD) or other gastrointestinal conditions.  Asthma or other breathing conditions.  Restless legs syndrome, sleep apnea, or other sleep disorders.  Chronic pain.  Menopause.  Stroke.  Abuse of alcohol, tobacco, or illegal drugs.  Mental health conditions, such as depression.  Caffeine.  Neurological disorders, such as Alzheimer's disease.  An overactive thyroid (hyperthyroidism). Sometimes, the cause of insomnia may not be known. What increases the risk? Risk factors for insomnia include:  Gender. Women are affected more often  than men.  Age. Insomnia is more common as you get older.  Stress.  Lack of exercise.  Irregular work schedule or working night shifts.  Traveling between different time zones.  Certain medical and mental health conditions. What are the signs or symptoms? If you have insomnia, the main symptom is having trouble falling asleep or having trouble staying asleep. This may lead to other symptoms, such as:  Feeling fatigued or having low energy.  Feeling nervous about going to sleep.  Not feeling rested in the morning.  Having trouble concentrating.  Feeling irritable, anxious, or depressed. How is this diagnosed? This condition may be diagnosed based on:  Your symptoms and medical history. Your health care provider may ask about: ? Your sleep habits. ? Any medical conditions you have. ? Your mental health.  A physical exam. How is this treated? Treatment for insomnia depends on the cause. Treatment may focus on treating an underlying condition that is causing insomnia. Treatment may also include:  Medicines to help you sleep.  Counseling or therapy.  Lifestyle adjustments to help you sleep better. Follow these instructions at home: Eating and drinking   Limit or avoid alcohol, caffeinated beverages, and cigarettes, especially close to bedtime. These can disrupt your sleep.  Do not eat a large meal or eat spicy foods right before bedtime. This can lead to digestive discomfort that can make it hard for you to sleep. Sleep habits   Keep a sleep diary to help you and your health care provider figure out what could be causing your insomnia. Write down: ? When you sleep. ? When you wake up during the  night. ? How well you sleep. ? How rested you feel the next day. ? Any side effects of medicines you are taking. ? What you eat and drink.  Make your bedroom a dark, comfortable place where it is easy to fall asleep. ? Put up shades or blackout curtains to block light  from outside. ? Use a white noise machine to block noise. ? Keep the temperature cool.  Limit screen use before bedtime. This includes: ? Watching TV. ? Using your smartphone, tablet, or computer.  Stick to a routine that includes going to bed and waking up at the same times every day and night. This can help you fall asleep faster. Consider making a quiet activity, such as reading, part of your nighttime routine.  Try to avoid taking naps during the day so that you sleep better at night.  Get out of bed if you are still awake after 15 minutes of trying to sleep. Keep the lights down, but try reading or doing a quiet activity. When you feel sleepy, go back to bed. General instructions  Take over-the-counter and prescription medicines only as told by your health care provider.  Exercise regularly, as told by your health care provider. Avoid exercise starting several hours before bedtime.  Use relaxation techniques to manage stress. Ask your health care provider to suggest some techniques that may work well for you. These may include: ? Breathing exercises. ? Routines to release muscle tension. ? Visualizing peaceful scenes.  Make sure that you drive carefully. Avoid driving if you feel very sleepy.  Keep all follow-up visits as told by your health care provider. This is important. Contact a health care provider if:  You are tired throughout the day.  You have trouble in your daily routine due to sleepiness.  You continue to have sleep problems, or your sleep problems get worse. Get help right away if:  You have serious thoughts about hurting yourself or someone else. If you ever feel like you may hurt yourself or others, or have thoughts about taking your own life, get help right away. You can go to your nearest emergency department or call:  Your local emergency services (911 in the [place]  A suicide crisis helpline, such as the [facility] at  (334)525-3034. This is open 24 hours a day. Summary  Insomnia is a sleep disorder that makes it difficult to fall asleep or stay asleep.  Insomnia can be long-term (chronic) or short-term (acute).  Treatment for insomnia depends on the cause. Treatment may focus on treating an underlying condition that is causing insomnia.  Keep a sleep diary to help you and your health care provider figure out what could be causing your insomnia. This information is not intended to replace advice given to you by your health care provider. Make sure you discuss any questions you have with your health care provider. Document Released: 03/24/2000 Document Revised: 03/09/2017 Document Reviewed: 01/04/2017 Elsevier Patient Education  [employer].   If you have lab work done today you will be contacted with your lab results within the next 2 weeks.  If you have not heard from [place] then please contact us. The fastest way to get your results is to register for My Chart.   IF you received an x-ray today, you will receive an invoice from [facility]. Please contact [facility] at [phone] with questions or concerns regarding your invoice.   IF you received labwork today, you will receive an invoice  from [place]. Please contact [facility] at [phone] with questions or concerns regarding your invoice.   Our billing staff will not be able to assist you with questions regarding bills from these companies.  You will be contacted with the lab results as soon as they are available. The fastest way to get your results is to activate your My Chart account. Instructions are located on the last page of this paperwork. If you have not heard from [place] regarding the results in 2 weeks, please contact this office.         If you have lab work done today you will be contacted with your lab results within the next 2 weeks.  If you have not heard from [place] then please contact us. The fastest way to  get your results is to register for My Chart.   IF you received an x-ray today, you will receive an invoice from [facility]. Please contact [facility] at [phone] with questions or concerns regarding your invoice.   IF you received labwork today, you will receive an invoice from [place]. Please contact [facility] at [phone] with questions or concerns regarding your invoice.   Our billing staff will not be able to assist you with questions regarding bills from these companies.  You will be contacted with the lab results as soon as they are available. The fastest way to get your results is to activate your My Chart account. Instructions are located on the last page of this paperwork. If you have not heard from [place] regarding the results in 2 weeks, please contact this office.

## 2018-11-08 NOTE — Telephone Encounter (Signed)
Call received from [facility] at [facility] radiology to report finding on xray ordered by Dr Carlota Raspberry  Call placed to Dr Carlota Raspberry. He is reviewing images now.

## 2018-11-09 DIAGNOSIS — Z86711 Personal history of pulmonary embolism: Secondary | ICD-10-CM

## 2018-11-09 DIAGNOSIS — Z86718 Personal history of other venous thrombosis and embolism: Secondary | ICD-10-CM

## 2018-11-09 HISTORY — DX: Personal history of pulmonary embolism: Z86.711

## 2018-11-09 HISTORY — DX: Personal history of other venous thrombosis and embolism: Z86.718

## 2018-11-09 LAB — CMP14+EGFR
ALT: 18 IU/L (ref 0–44)
AST: 24 IU/L (ref 0–40)
Albumin/Globulin Ratio: 1.1 — ABNORMAL LOW (ref 1.2–2.2)
Albumin: 3.8 g/dL (ref 3.8–4.8)
Alkaline Phosphatase: 88 IU/L (ref 39–117)
BUN/Creatinine Ratio: 10 (ref 10–24)
BUN: 7 mg/dL — ABNORMAL LOW (ref 8–27)
Bilirubin Total: 1 mg/dL (ref 0.0–1.2)
CO2: 24 mmol/L (ref 20–29)
Calcium: 9.7 mg/dL (ref 8.6–10.2)
Chloride: 90 mmol/L — ABNORMAL LOW (ref 96–106)
Creatinine, Ser: 0.71 mg/dL — ABNORMAL LOW (ref 0.76–1.27)
GFR calc Af Amer: 110 mL/min/{1.73_m2} (ref >59)
GFR calc non Af Amer: 95 mL/min/{1.73_m2} (ref >59)
Globulin, Total: 3.4 g/dL (ref 1.5–4.5)
Glucose: 100 mg/dL — ABNORMAL HIGH (ref 65–99)
Potassium: 5 mmol/L (ref 3.5–5.2)
Sodium: 135 mmol/L (ref 134–144)
Total Protein: 7.2 g/dL (ref 6.0–8.5)

## 2018-11-09 LAB — T4 AND TSH
T4, Total: 8.7 ug/dL (ref 4.5–12.0)
TSH: 0.441 u[IU]/mL — ABNORMAL LOW (ref 0.450–4.500)

## 2018-11-10 ENCOUNTER — Encounter: Payer: Self-pay | Admitting: Family Medicine

## 2018-11-13 ENCOUNTER — Other Ambulatory Visit: Payer: Self-pay

## 2018-11-13 ENCOUNTER — Other Ambulatory Visit: Payer: Self-pay | Admitting: Family Medicine

## 2018-11-13 ENCOUNTER — Ambulatory Visit
Admission: RE | Admit: 2018-11-13 | Discharge: 2018-11-13 | Disposition: A | Discharge status: Home / Self Care | Payer: Medicare HMO | Source: Ambulatory Visit | Attending: Family Medicine | Admitting: Family Medicine

## 2018-11-13 ENCOUNTER — Telehealth: Payer: Self-pay | Admitting: Emergency Medicine

## 2018-11-13 DIAGNOSIS — J90 Pleural effusion, not elsewhere classified: Secondary | ICD-10-CM

## 2018-11-13 DIAGNOSIS — R9389 Abnormal findings on diagnostic imaging of other specified body structures: Secondary | ICD-10-CM

## 2018-11-13 DIAGNOSIS — J189 Pneumonia, unspecified organism: Secondary | ICD-10-CM | POA: Diagnosis not present

## 2018-11-13 MED ORDER — IOPAMIDOL (ISOVUE-300) INJECTION 61%
75.0000 mL | Freq: Once | INTRAVENOUS | Status: AC | PRN
  Administered 2018-11-13: 75 mL via INTRAVENOUS

## 2018-11-13 NOTE — Progress Notes (Unsigned)
Ct and

## 2018-11-13 NOTE — Telephone Encounter (Signed)
Patient was informed he has a CT Angio scheduled for today around 7:00 ish. Patient stated he will head on over to have CT

## 2018-11-14 ENCOUNTER — Ambulatory Visit (INDEPENDENT_AMBULATORY_CARE_PROVIDER_SITE_OTHER): Payer: Medicare HMO | Admitting: Family Medicine

## 2018-11-14 ENCOUNTER — Telehealth: Payer: Self-pay | Admitting: Internal Medicine

## 2018-11-14 ENCOUNTER — Telehealth: Payer: Self-pay

## 2018-11-14 ENCOUNTER — Encounter: Payer: Self-pay | Admitting: Family Medicine

## 2018-11-14 ENCOUNTER — Ambulatory Visit (HOSPITAL_COMMUNITY)
Admission: RE | Admit: 2018-11-14 | Discharge: 2018-11-14 | Disposition: A | Discharge status: Home / Self Care | Payer: Medicare HMO | Source: Ambulatory Visit | Attending: Family Medicine | Admitting: Family Medicine

## 2018-11-14 VITALS — BP 142/86 | HR 123 | Temp 98.3°F | Resp 16 | Wt 134.6 lb

## 2018-11-14 DIAGNOSIS — N529 Male erectile dysfunction, unspecified: Secondary | ICD-10-CM | POA: Diagnosis not present

## 2018-11-14 DIAGNOSIS — I712 Thoracic aortic aneurysm, without rupture, unspecified: Secondary | ICD-10-CM

## 2018-11-14 DIAGNOSIS — R9389 Abnormal findings on diagnostic imaging of other specified body structures: Secondary | ICD-10-CM | POA: Insufficient documentation

## 2018-11-14 DIAGNOSIS — I2699 Other pulmonary embolism without acute cor pulmonale: Secondary | ICD-10-CM | POA: Diagnosis not present

## 2018-11-14 DIAGNOSIS — I2694 Multiple subsegmental pulmonary emboli without acute cor pulmonale: Secondary | ICD-10-CM

## 2018-11-14 DIAGNOSIS — J189 Pneumonia, unspecified organism: Secondary | ICD-10-CM

## 2018-11-14 DIAGNOSIS — J90 Pleural effusion, not elsewhere classified: Secondary | ICD-10-CM | POA: Insufficient documentation

## 2018-11-14 DIAGNOSIS — E039 Hypothyroidism, unspecified: Secondary | ICD-10-CM | POA: Diagnosis not present

## 2018-11-14 MED ORDER — TADALAFIL 10 MG PO TABS: 5.0000 mg | ORAL_TABLET | ORAL | 5 refills | Status: DC | PRN

## 2018-11-14 MED ORDER — IOHEXOL 350 MG/ML SOLN
100.0000 mL | Freq: Once | INTRAVENOUS | Status: AC | PRN
  Administered 2018-11-14: 100 mL via INTRAVENOUS

## 2018-11-14 MED ORDER — RIVAROXABAN (XARELTO) VTE STARTER PACK (15 & 20 MG): ORAL_TABLET | ORAL | 0 refills | Status: DC

## 2018-11-14 MED ORDER — LEVOTHYROXINE SODIUM 112 MCG PO TABS: 112.0000 ug | ORAL_TABLET | Freq: Every day | ORAL | 1 refills | Status: DC

## 2018-11-14 MED ORDER — SODIUM CHLORIDE (PF) 0.9 % IJ SOLN
INTRAMUSCULAR | Status: AC
  Filled 2018-11-14: qty 50

## 2018-11-14 NOTE — Patient Instructions (Addendum)
Depending on CT angiogram today, can discuss further testing if needed, blood thinner if needed and possible pulmonary evaluation.   Lower dose of synthroid for now and recheck levels in 6 weeks.   Hold on use of Cialis until lung issues are clarified/treated.   If you have lab work done today you will be contacted with your lab results within the next 2 weeks.  If you have not heard from [place] then please contact us. The fastest way to get your results is to register for My Chart.   IF you received an x-ray today, you will receive an invoice from [facility]. Please contact [facility] at [phone] with questions or concerns regarding your invoice.   IF you received labwork today, you will receive an invoice from [facility]. Please contact [facility] at [phone] with questions or concerns regarding your invoice.   Our billing staff will not be able to assist you with questions regarding bills from these companies.  You will be contacted with the lab results as soon as they are available. The fastest way to get your results is to activate your My Chart account. Instructions are located on the last page of this paperwork. If you have not heard from [place] regarding the results in 2 weeks, please contact this office.

## 2018-11-14 NOTE — Telephone Encounter (Signed)
Today I called Ct scheduling to find out what was the mix up with patient's stat hold and call for 11/13/18 around 6:30-7:00. Note in chart. Patient went to [facility] for his stat hold and call. [name] stated they did not have any record of patient supposed to have CT Angio on 11/13/18 and told him that he was scheduled for stat hold and call 11/14/18. I spoke to scheduling myself on 11/13/18 and she told me to send him to [facility] and she called over to [facility] and it was ok for him to come. But today when I called to find out what happen they stated they have him down for today 11/14/18 at [facility] at 12:45. I told them the scheduling lady today I knew the emergency of this Ct and I would not have settled for an appt for 11/14/18. I called the ED ([name]/[name] before I called scheduling to see what would happen if I sent patient over and he already have a stat Ct ordered. I informed [name] that the patient have multiple Blood clot in long. [name] stated he would have to go through the ED and one of our Dr would have to order a new Ct since their Dr at the Ed would be the one to see patient and review the CT. Instead of going through the Ed since the order was already ordered I called scheduling and scheduled a hold and call stat for 11/13/18 they told me he could go right over.

## 2018-11-14 NOTE — Telephone Encounter (Signed)
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 He had already started levaquin for pneumonia. I recommended he start anticoagulation, arrange for [facility] to look for source, and call our office tomorrow 8/7 to get him in with Dr Melvyn Novas. Dr Carlota Raspberry felt this was appropriate and will proceed as outlined.

## 2018-11-14 NOTE — Telephone Encounter (Signed)
Spoke to pt while in CT.  Per Dr. Carlota Raspberry, continue ABX.  Speaking to pulmonary re clots on scan (new vs old) and will f/u directly with pt.  Pt ok to go home from CT at this time.

## 2018-11-14 NOTE — Addendum Note (Signed)
Addended by: Merri Ray R on: 11/14/2018 09:05 PM   Modules accepted: Orders

## 2018-11-14 NOTE — Progress Notes (Addendum)
Subjective:    Patient ID: Tyler Randall, male    DOB: 17-Oct-1945, 73 y.o.   MRN: [id number]  HPI Tyler Randall is a 73 y.o. male Presents today for: Chief Complaint  Patient presents with   Follow-up    Patient is here for his f/u from his CT with contrast. Patient was upset when first walked into the office. Patient was supposed to had a CT angio yday as a stat hold and call. spoke with ct center they informed me to sent patient to [facility] hospital to have ct done she called over to make sure patient was to go now (11/13/18 @ 7:00) patient went and they gave him the run around. Today I called Ct scheduling to find out what happened. Patient did not get the Ct done yday due to mix up on scheduling.    Right-sided rib pain, abnormal imaging: Discussed July 31.  Similar pain May 2019 after fall in [place].  Symptoms resolved in 6 weeks.  Reported 2 weeks ago right-sided pain without injury, no change in activity, slight cough which felt to be more reflux due to pain with a catching pain at times. In office x-ray indicated a right-sided pleural effusion, other opacities concerning for possible malignancy versus infection.  He was initially started on Levaquin 500 mg daily, CT chest obtained. I received stat call regarding CT chest yesterday -concern for possible filling defects within the right upper lobe, right lower lobe, left lower lobe.  Also with rounded areas of masslike airspace consolidation with internal lucencies abutting the pleura of the right lower lobe and right middle lobes suspicious for pulmonary infarction versus round atelectasis.  6 mm pulmonary nodule superior segment of right lower lobe.  Plan for CT angiogram yesterday, but as above there was some scheduling difficulty, and that is now planned for today.   No recent prolonged car travel or air travel, no recent calf pain or swelling. No recent surgery.   Denies dyspnea, including walking long distances/walking yesterday.  No  current chest pain.  No cough.  No hematemesis.    Ct Chest W Contrast  Result Date: 11/13/2018 CLINICAL DATA:  Pneumonia.  Right rib pain EXAM: CT CHEST WITH CONTRAST TECHNIQUE: Multidetector CT imaging of the chest was performed during intravenous contrast administration. CONTRAST:  35mL ISOVUE-300 IOPAMIDOL (ISOVUE-300) INJECTION 61% COMPARISON:  Chest radiograph 11/08/2018 FINDINGS: Cardiovascular: Heart size is normal. No pericardial effusion. Scattered atherosclerotic calcifications of the thoracic aorta. There are multiple apparent filling defects within the pulmonary arteries including a lobar branch of the right lower lobe (series 2, images 95-98) with additional probable filling defects more distally within the segmental and subsegmental branches (series 2, image 107). Possible defect within a right upper lobe lobar branch (series 2, image 68). Additional possible filling defect within a segmental branch of the left lower lobe (series 2, image 92). No central filling defect/saddle embolism identified. Mediastinum/Nodes: No enlarged mediastinal, hilar, or axillary lymph nodes. Thyroid gland, trachea, and esophagus demonstrate no significant findings. Lungs/Pleura: Masslike area of airspace consolidation with internal lucency and volume loss within the lateral aspect of the right lower lobe abutting the pleura. In total this area measures approximately 5.6 cm AP by 1.7 cm TR x 5.8 cm CC (series 3, image 122). Additional similar, smaller area of masslike airspace consolidation within the inferior aspect of the right middle lobe abutting the pleura measuring 4.3 x 2.3 by 1.5 cm (series 3, image 140). Pleural surfaces appear otherwise normal.  There is a 6 x 3 mm juxtapleural pulmonary nodule at the superior segment of the right lower lobe abutting the posterior pleural surface (series 3, image 61). The left lung is clear. No pleural effusion. No pneumothorax. Upper Abdomen: No acute abnormality.  Musculoskeletal: No chest wall abnormality. No acute or significant osseous findings. No rib fractures. IMPRESSION: 1. While protocol is not optimized for detection of PE, there appear to be multiple filling defects within the right upper lobe, right lower lobe, and left lower lobe pulmonary arteries suspicious for PE. Dedicated CTA of the chest is recommended for further evaluation. 2. Rounded areas of masslike airspace consolidation with internal lucencies abutting the pleura of the right lower lobe and right middle lobes raising the suspicion for pulmonary infarctions given the suggestion of multifocal PEs. Alternatively, these may represent areas of round atelectasis, although there is no adjacent pleural abnormality. 3. Juxtapleural 6 mm pulmonary nodule within the superior segment of the right lower lobe. These results will be called to the ordering clinician or representative by the Radiologist Assistant, and communication documented in the PACS or zVision Dashboard. Electronically Signed   By: Davina Poke M.D.   On: 11/13/2018 14:03   Hypothyroidism: Lab Results  Component Value Date   TSH 0.441 (L) 11/08/2018  See last office visit.  Slightly low last year with planned follow-up, but had not been repeated until recently.  He is currently on Synthroid 125 mcg daily.   Erectile dysfunction:  On cialis 5mg  - taking 10mg  rarely, none past 4 months. plans to wait to use until pulmonary issue stabilized. No change in vision/hearing/chest pain, or DOE. No back pain.   History of melanoma in 2003, prostate CA with seed implant in 2003.  Followed by derm - appt last October. Urology last fall as well - reports normal PSA. (1.4 in 2018).   Denies depression since stopping remeron. Still would like to remain off that med.   Patient Active Problem List   Diagnosis Date Noted   Prostate cancer ([place]) 03/27/2014   Melanoma of skin, site unspecified 10/06/2013   Unspecified hypothyroidism  10/06/2013   Unspecified vitamin D deficiency 10/06/2013   Thyroid condition 11/28/2011   Past Medical History:  Diagnosis Date   Allergy    Depression    Prostate cancer ([place])    Skin cancer    melanoma   Thyroid disease    hypothyroidism   Past Surgical History:  Procedure Laterality Date   COLONOSCOPY  2014   melanoma excision from back      POLYPECTOMY  2014   PROSTATE SURGERY     radiation seed implants     TONSILLECTOMY     Allergies  Allergen Reactions   Latex     With prolonged exposure   Sulfa Antibiotics     Not sure reaction, started in infancy   Prior to Admission medications   Medication Sig Start Date End Date Taking? Authorizing Provider  Cholecalciferol (VITAMIN D PO) Take by mouth daily.   Yes [provider]  levofloxacin (LEVAQUIN) 500 MG tablet Take 1 tablet (500 mg total) by mouth daily. 11/08/18  Yes [name], MD  levothyroxine (SYNTHROID) 125 MCG tablet Take 1 tablet (125 mcg total) by mouth daily before breakfast. 11/08/18  Yes [name], MD  Multiple Vitamins-Minerals (CENTRUM SILVER PO) Take 1 tablet by mouth.   Yes [provider]  tadalafil (CIALIS) 5 MG tablet TAKE 1 TABLET (5 MG TOTAL) BY MOUTH AS  NEEDED. 09/13/18  Yes [name], MD   Social History   Socioeconomic History   Marital status: Divorced    Spouse name: Not on file   Number of children: Not on file   Years of education: Not on file   Highest education level: Not on file  Occupational History   Not on file  Social Needs   Financial resource strain: Not on file   Food insecurity    Worry: Not on file    Inability: Not on file   Transportation needs    Medical: Not on file    Non-medical: Not on file  Tobacco Use   Smoking status: Former Smoker    Packs/day: 0.50    Quit date: 02/03/2016    Years since quitting: 2.7   Smokeless tobacco: Never Used  Substance and Sexual Activity   Alcohol use: Yes     Alcohol/week: 20.0 standard drinks    Types: 20 Cans of beer per week   Drug use: No   Sexual activity: Not on file  Lifestyle   Physical activity    Days per week: Not on file    Minutes per session: Not on file   Stress: Not on file  Relationships   Social connections    Talks on phone: Not on file    Gets together: Not on file    Attends religious service: Not on file    Active member of club or organization: Not on file    Attends meetings of clubs or organizations: Not on file    Relationship status: Not on file   Intimate partner violence    Fear of current or ex partner: Not on file    Emotionally abused: Not on file    Physically abused: Not on file    Forced sexual activity: Not on file  Other Topics Concern   Not on file  Social History Narrative   Not on file    Review of Systems Per HPi.     Objective:   Physical Exam Vitals signs reviewed.  Constitutional:      Appearance: He is well-developed.  HENT:     Head: Normocephalic and atraumatic.  Eyes:     Pupils: Pupils are equal, round, and reactive to light.  Neck:     Vascular: No carotid bruit or JVD.  Cardiovascular:     Rate and Rhythm: Regular rhythm. Tachycardia present.     Heart sounds: Normal heart sounds. No murmur.     Comments: Tachycardic but regular.  Pulmonary:     Effort: Pulmonary effort is normal. No respiratory distress.     Breath sounds: Normal breath sounds. No stridor. No wheezing, rhonchi or rales.     Comments: Speaking in full sentences, no distress.  Chest:     Chest wall: No tenderness.  Skin:    General: Skin is warm and dry.  Neurological:     Mental Status: He is alert and oriented to person, place, and time.    Vitals:   11/14/18 0914 11/14/18 0931  BP: (!) 170/100 (!) 142/86  Pulse: (!) 123   Resp: 16   Temp: 98.3 F (36.8 C)   TempSrc: Oral   SpO2: 98%   Weight: 134 lb 9.6 oz (61.1 kg)       Assessment & Plan:   Tyler Randall is a 74 y.o.  male Hypothyroidism, unspecified type - Plan: levothyroxine (SYNTHROID) 112 MCG tablet  -Borderline but with persistent low TSH,  tachycardia we will try lower dose of Synthroid with close follow-up testing in 6 weeks.  Tachycardia may be due to possible pulmonary embolus as below.  Erectile dysfunction, unspecified erectile dysfunction type - Plan: tadalafil (CIALIS) 10 MG tablet  -Tolerating Cialis previously.  Refill ordered, but recommended against use of that medication until pulmonary issues have been further evaluated and treated.   Abnormal CT of the chest  -Suspicious for multi lobar pulmonary embolus on CT yesterday, had some difficulty scheduling CT angiogram last night but that is scheduled in the next few hours.  No signs of strain on exam, no peripheral edema, lungs were clear on exam, O2 sat normal.  Borderline elevated BP, but also some frustration given difficulty with CT yesterday.  -CT angiogram today, then determine if echo needed to evaluate strength, further discussion with pulmonary versus cardiology once results obtained.  -If pulmonary embolus is present, denies any preceding calf pain/swelling, and no known DVT risk factors.  Likely will need evaluation with hematology/oncology for further evaluation.  Of note he has had ongoing follow-up with his urologist and dermatologist with remote history of prostate cancer and melanoma.  No discrete mass noted on CT to indicate lung cancer at this time, but again will discuss with pulmonary.  1:29 PM No R heart strain. 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  Possible acute PE in peripheral in R upper lobe, and other areas of pneumonia.  4.7 cm ascending aortic aneurysm.  Report to be sent.    8:52 PM Spoke with pulmonary earlier tonight and discussed results with patient: 1. Start Xarelto for PE - duration can be determined after meeting with specialist. Denies hx of bleeding  issues, no PUD, potential risk of bleeding discussed.  2. Continue levaquin for full 10 day treatment.  3. Refer to pulmonary for further evaluation.  4. Order u/s of LE to r/o DVT.  5. If any new or worsening chest pain, shortness of breath or worse symptoms - be seen right away.  6. Refer to cardiothoracic surgery for thoracic aortic aneurysm.   Meds ordered this encounter  Medications   levothyroxine (SYNTHROID) 112 MCG tablet    Sig: Take 1 tablet (112 mcg total) by mouth daily.    Dispense:  90 tablet    Refill:  1   tadalafil (CIALIS) 10 MG tablet    Sig: Take 0.5-1 tablets (5-10 mg total) by mouth as needed.    Dispense:  15 tablet    Refill:  5    N52.9.   Patient Instructions   Depending on CT angiogram today, can discuss further testing if needed, blood thinner if needed and possible pulmonary evaluation.   Lower dose of synthroid for now and recheck levels in 6 weeks.   Hold on use of Cialis until lung issues are clarified/treated.   If you have lab work done today you will be contacted with your lab results within the next 2 weeks.  If you have not heard from [place] then please contact us. The fastest way to get your results is to register for My Chart.   IF you received an x-ray today, you will receive an invoice from [facility]. Please contact [facility] at [phone] with questions or concerns regarding your invoice.   IF you received labwork today, you will receive an invoice from [facility]. Please contact [facility] at [phone] with questions or concerns regarding your invoice.   Our billing staff will  not be able to assist you with questions regarding bills from these companies.  You will be contacted with the lab results as soon as they are available. The fastest way to get your results is to activate your My Chart account. Instructions are located on the last page of this paperwork. If you have not heard from [place] regarding the results in 2  weeks, please contact this office.       Signed,   Merri Ray, MD [facility] [place].  11/14/18 10:41 AM

## 2018-11-15 NOTE — Addendum Note (Signed)
Addended by: Merri Ray R on: 11/15/2018 07:54 PM   Modules accepted: Orders

## 2018-11-20 ENCOUNTER — Encounter (HOSPITAL_COMMUNITY): Payer: Medicare HMO

## 2018-11-20 ENCOUNTER — Telehealth: Payer: Self-pay | Admitting: Family Medicine

## 2018-11-20 ENCOUNTER — Other Ambulatory Visit: Payer: Self-pay

## 2018-11-20 ENCOUNTER — Ambulatory Visit (HOSPITAL_COMMUNITY)
Admission: RE | Admit: 2018-11-20 | Discharge: 2018-11-20 | Disposition: A | Discharge status: Home / Self Care | Payer: Medicare HMO | Source: Ambulatory Visit | Attending: Family Medicine | Admitting: Family Medicine

## 2018-11-20 DIAGNOSIS — I2694 Multiple subsegmental pulmonary emboli without acute cor pulmonale: Secondary | ICD-10-CM | POA: Insufficient documentation

## 2018-11-20 NOTE — Telephone Encounter (Signed)
Dr. Carlota Raspberry reviewed results.  Call to pt.  Pt advised per Dr. Carlota Raspberry that pt has DVT in L leg.  Pt is already on an anticoagulant.  Feels that the PE could be coming from the DVT.  Pt denies any acute changes in LLE.  At this time, no need to go to ER. Just continue medication.  Pt advised to go to ER if he experiences acute changes in LLE.  Pt verbalizes understanding.

## 2018-11-20 NOTE — Progress Notes (Signed)
Bilateral lower extremity veous duplex completed. Preliminary results in Chart review CV Proc. [place] Laquia Rosano,RVS 11/20/2018, 12:09 PM

## 2018-11-20 NOTE — Telephone Encounter (Signed)
[facility] vascular lab calling with critical result for patient.  Rt leg normal Left leg DVT iliac proximal common femoral sapheno femoral junction.  Age undetermined  NT unable to reach anyone in office. Result can be viewed in syngo.  Pt refusing to wait per vascular lab. Please advise patient. He will be at home.

## 2018-11-21 ENCOUNTER — Telehealth: Payer: Self-pay | Admitting: Family Medicine

## 2018-11-21 ENCOUNTER — Telehealth: Payer: Self-pay | Admitting: Critical Care Medicine

## 2018-11-21 NOTE — Telephone Encounter (Signed)
Copied from [place] [phone]. Topic: General - Inquiry >> Nov 19, 2018 10:55 AM Virl Axe D wrote: Reason for CRM: Pt called to follow up on appt regarding an aortic aneurysm. Pt stated he does not want to be scheduled at [facility]. Please schedule at [facility]. CB#[phone]

## 2018-11-22 NOTE — Telephone Encounter (Signed)
lvm

## 2018-11-24 NOTE — Telephone Encounter (Signed)
Referral sent over [facility] on 8/10/2

## 2018-11-24 NOTE — Progress Notes (Signed)
Subjective:   PATIENT ID: Tyler Randall GENDER: male DOB: 05-12-1945, MRN: [id number]   HPI  Chief Complaint  Patient presents with  . Consult    blood clots in right lung possible left, referred by Dr. Cindee Lame    Reason for Visit: New consult for pulmonary emboli  Tyler Randall is a 73 year old male former smoker with significant tobacco history (>99 pack-years) with history of prostate cancer in 2003 s/p brachytherapy and radiation, remote history of melanoma who presents for management of newly diagnosed pulmonary emboli  Three weeks ago, he developed excruciating right-sided rib pain aggravated with any movement, shooting through his chest. This pain was similar to a fall in 2019. He was evaluated by his PCP who ordered chest imaging and he was found with pulmonary emboli in the RUL and the RLL. Imaging also noted area of consolidation on the medial aspect of the RML and the right lower lobe. Since starting anticoagulation, he denies bruising, hemoptysis, hematochezia or any other form of bleeding. Since the pandemic, he has not been as active in these last four months. Has been watching TV and running occasional errands. Of note his history is significant for remote prostate cancer and melanoma. Hx of prostate in 2003 s/p seed implants via Dr. Risa Grill. Followed by Urology yearly however last visit on 07/03/16.  Social History: 175 pack years. Quit 3 years ago.  Environmental exposures: None  I have personally reviewed patient's past medical/family/social history, allergies, current medications.  Past Medical History:  Diagnosis Date  . Allergy   . Depression   . Hypothyroidism   . Prostate cancer ([place])   . Pulmonary embolism ([place])   . Skin cancer    melanoma  . Thyroid disease    hypothyroidism     Family History  Problem Relation Age of Onset  . Pneumonia Mother   . Liver cancer Father   . Thyroid disease Sister   . Mental retardation Brother   . Lung  cancer Brother   . Bone cancer Brother   . Liver cancer Brother   . Colon polyps Neg Hx   . Colon cancer Neg Hx   . Esophageal cancer Neg Hx   . Rectal cancer Neg Hx   . Stomach cancer Neg Hx      Social History   Occupational History  . Not on file  Tobacco Use  . Smoking status: Former Smoker    Packs/day: 3.50    Years: 50.00    Pack years: 175.00    Start date: 36    Quit date: 02/03/2016    Years since quitting: 2.8  . Smokeless tobacco: Never Used  . Tobacco comment: cut down to 1 pack when quit in 2017  Substance and Sexual Activity  . Alcohol use: Yes    Alcohol/week: 20.0 standard drinks    Types: 20 Cans of beer per week  . Drug use: No  . Sexual activity: Not on file    Allergies  Allergen Reactions  . Latex     With prolonged exposure  . Sulfa Antibiotics     Not sure reaction, started in infancy     Outpatient Medications Prior to Visit  Medication Sig Dispense Refill  . Cholecalciferol (VITAMIN D PO) Take by mouth daily.    Marland Kitchen levothyroxine (SYNTHROID) 112 MCG tablet Take 1 tablet (112 mcg total) by mouth daily. 90 tablet 1  . Multiple Vitamins-Minerals (CENTRUM SILVER PO) Take 1 tablet by mouth.    Marland Kitchen  Rivaroxaban 15 & 20 MG TBPK Take as directed on package: Start with one 15mg  tablet by mouth twice a day with food. On Day 22, switch to one 20mg  tablet once a day with food. 51 each 0  . tadalafil (CIALIS) 10 MG tablet Take 0.5-1 tablets (5-10 mg total) by mouth as needed. 15 tablet 5  . levofloxacin (LEVAQUIN) 500 MG tablet Take 1 tablet (500 mg total) by mouth daily. (Patient not taking: Reported on 11/25/2018) 10 tablet 0   No facility-administered medications prior to visit.     Review of Systems  Constitutional: Negative for chills, diaphoresis, fever, malaise/fatigue and weight loss.  HENT: Negative for congestion, ear pain and sore throat.   Respiratory: Negative for cough, hemoptysis, sputum production, shortness of breath and wheezing.    Cardiovascular: Negative for chest pain, palpitations and leg swelling.  Gastrointestinal: Negative for abdominal pain, heartburn and nausea.  Genitourinary: Negative for frequency.  Musculoskeletal: Negative for joint pain and myalgias.  Skin: Negative for itching and rash.  Neurological: Negative for dizziness, weakness and headaches.  Endo/Heme/Allergies: Does not bruise/bleed easily.  Psychiatric/Behavioral: Negative for depression. The patient is not nervous/anxious.      Objective:   Vitals:   11/25/18 1612  Temp: 97.6 F (36.4 C)  TempSrc: Oral  Weight: 133 lb 9.6 oz (60.6 kg)  Height: 6\' 2"  (1.88 m)      Physical Exam: General: Well-appearing, no acute distress HENT: [place], AT Eyes: EOMI, no scleral icterus Respiratory: Clear to auscultation bilaterally.  No crackles, wheezing or rales Cardiovascular: RRR, -M/R/G, no JVD GI: BS+, soft, nontender Extremities:-Edema,-tenderness Neuro: AAO x4, CNII-XII grossly intact Skin: Intact, no rashes or bruising Psych: Normal mood, normal affect  Data Reviewed:  Imaging: 11/20/18 LE Doppler - Age-indeterminate DVT involving the iliac, proximal left common femoral vein and sapheno-femoral junction 11/14/18 CTA - pulmonary emboli in the RUL and the RLL. Consolidation on the medial aspect of the RML and the right lower lobe   PFT: None on file  Labs: CMP Latest Ref Rng & Units 11/08/2018 11/12/2017 11/09/2016  Glucose 65 - 99 mg/dL 100(H) 93 109(H)  BUN 8 - 27 mg/dL 7(L) 11 8  Creatinine 0.76 - 1.27 mg/dL 0.71(L) 0.82 0.75(L)  Sodium 134 - 144 mmol/L 135 143 142  Potassium 3.5 - 5.2 mmol/L 5.0 4.7 4.5  Chloride 96 - 106 mmol/L 90(L) 100 99  CO2 20 - 29 mmol/L 24 24 25   Calcium 8.6 - 10.2 mg/dL 9.7 9.9 9.6  Total Protein 6.0 - 8.5 g/dL 7.2 7.2 7.4  Total Bilirubin 0.0 - 1.2 mg/dL 1.0 1.7(H) 1.4(H)  Alkaline Phos 39 - 117 IU/L 88 76 72  AST 0 - 40 IU/L 24 26 19   ALT 0 - 44 IU/L 18 15 8    Imaging, labs and tests noted above  have been reviewed independently by me.    Assessment & Plan:   Discussion: 73 year old male with remote history of prostate and melanoma cancer who presents for management of pulmonary emboli.  Multiple risk factors including sedentary lifestyle, DVT involving the proximal left femoral and saphenofemoral junction and prior history of malignancy may of contributed to his PE.  I suspect his anticoagulation will be long-term in setting of his lower extremity DVT that is unlikely to resolve.  If patient potentially needs surgery near future, would recommend delaying invasive procedures for at least 3 months of therapy first.  Risk and benefits of holding anticoagulation should be discussed before any procedure.  Pulmonary  emboli CONTINUE your xarelto Will sending referral to our clinic pharmacist to review other options for anticoagulation due to cost Agree with hematology/oncology referral for further work-up in setting of prior malignancy history  Pulmonary mass - suspect pulmonary infarct No prior imaging for comparison. Significant tobacco history Repeat CT CHEST in 3 months  May need to consider holding anticoagulation if lesion enlarges  Health Maintenance Immunization History  Administered Date(s) Administered  . Influenza Split 02/07/2012  . Influenza,inj,Quad PF,6+ Mos 02/20/2013, 03/27/2014  . Influenza-Unspecified 01/09/2016, 01/02/2018  . Pneumococcal Conjugate-13 06/04/2014  . Pneumococcal Polysaccharide-23 11/09/2016  . Tdap 11/09/2016    Orders Placed This Encounter  Procedures  . CT Chest Wo Contrast    Standing Status:   Future    Standing Expiration Date:   01/25/2020    Scheduling Instructions:     Please schedule in 3 months    Order Specific Question:   ** REASON FOR EXAM (FREE TEXT)    Answer:   pulmonary infarct    Order Specific Question:   Preferred imaging location?    Answer:   [place]    Order Specific Question:   Radiology Contrast  Protocol - do NOT remove file path    Answer:   [email or web address]  . Amb Referral to Clinical Pharmacist    Referral Priority:   Routine    Referral Type:   Consultation    Number of Visits Requested:   1  No orders of the defined types were placed in this encounter.   No follow-ups on file.  [place], MD [facility] 11/25/2018 9:19 PM  Office Number [phone]

## 2018-11-24 NOTE — Telephone Encounter (Signed)
Referral sent over on 11/18/2018 to [facility].  Pt will receive call from office regarding appt.

## 2018-11-25 ENCOUNTER — Other Ambulatory Visit: Payer: Self-pay

## 2018-11-25 ENCOUNTER — Ambulatory Visit (INDEPENDENT_AMBULATORY_CARE_PROVIDER_SITE_OTHER): Payer: Medicare HMO | Admitting: Pulmonary Disease

## 2018-11-25 ENCOUNTER — Encounter: Payer: Self-pay | Admitting: Pulmonary Disease

## 2018-11-25 VITALS — Temp 97.6°F | Ht 74.0 in | Wt 133.6 lb

## 2018-11-25 DIAGNOSIS — I2699 Other pulmonary embolism without acute cor pulmonale: Secondary | ICD-10-CM

## 2018-11-25 NOTE — Patient Instructions (Signed)
Pulmonary emboli CONTINUE your xarelto Will sending referral to our clinic pharmacist to review other options for anticoagulation due to cost  Pulmonary mass Repeat CT CHEST in 3 months  May need to consider holding anticoagulation if lesion enlarges  Watch for RED FLAGS - weight loss, hemoptysis (couging off blood), bleeding, night sweats or unexplained fevers or chillls

## 2018-11-25 NOTE — Telephone Encounter (Signed)
Pt scheduled with [facility] on 11/29/2018 at 9:30 am and pt is aware of appointment.

## 2018-11-27 ENCOUNTER — Telehealth: Payer: Self-pay | Admitting: Pharmacist

## 2018-11-27 ENCOUNTER — Ambulatory Visit (INDEPENDENT_AMBULATORY_CARE_PROVIDER_SITE_OTHER): Payer: Medicare HMO | Admitting: Family Medicine

## 2018-11-27 VITALS — BP 142/86 | Ht 74.0 in | Wt 133.0 lb

## 2018-11-27 DIAGNOSIS — Z0000 Encounter for general adult medical examination without abnormal findings: Secondary | ICD-10-CM | POA: Diagnosis not present

## 2018-11-27 NOTE — Telephone Encounter (Signed)
Received request from Dr. Loanne Drilling for less expensive anticoagulant options.    Test claim ran for both Xarelto and Eliquis.  Both medications have a $130 co-pay for a 30 day supply.  He has Medicare and is not eligible to use co-pay cards.  Wynetta Emery and [employer], the [employer], newspaper, has a patient assistance program he may be eligible for.  A pharmacy print out of out of pocket expenses of prescription medication is required and must show that he has spent 4% of his income.  [place], has a patient assistance program he may be eligible for.  A pharmacy print out of out of pocket expenses of prescription medication is required and must show that he has spent 3% of his income.  Do not think he would be a good candidate for Savaysa or Pradaxa.  Can also consider warfarin which is more cost effective but does require more monitoring.  Let me know if I can be of further assistance.    Mariella Saa, PharmD, [place], [place] Clinical Specialty Pharmacist [phone]  11/27/2018 2:33 PM

## 2018-11-27 NOTE — Patient Instructions (Signed)
Thank you for taking time to come for your Medicare Wellness Visit. I appreciate your ongoing commitment to your health goals. Please review the following plan we discussed and let me know if I can assist you in the future.  Leroy Kennedy LPN  Preventive Care 71 Years and Older, Male Preventive care refers to lifestyle choices and visits with your health care provider that can promote health and wellness. This includes:  A yearly physical exam. This is also called an annual well check.  Regular dental and eye exams.  Immunizations.  Screening for certain conditions.  Healthy lifestyle choices, such as diet and exercise. What can I expect for my preventive care visit? Physical exam Your health care provider will check:  Height and weight. These may be used to calculate body mass index (BMI), which is a measurement that tells if you are at a healthy weight.  Heart rate and blood pressure.  Your skin for abnormal spots. Counseling Your health care provider may ask you questions about:  Alcohol, tobacco, and drug use.  Emotional well-being.  Home and relationship well-being.  Sexual activity.  Eating habits.  History of falls.  Memory and ability to understand (cognition).  Work and work Statistician. What immunizations do I need?  Influenza (flu) vaccine  This is recommended every year. Tetanus, diphtheria, and pertussis (Tdap) vaccine  You may need a Td booster every 10 years. Varicella (chickenpox) vaccine  You may need this vaccine if you have not already been vaccinated. Zoster (shingles) vaccine  You may need this after age 66. Pneumococcal conjugate (PCV13) vaccine  One dose is recommended after age 84. Pneumococcal polysaccharide (PPSV23) vaccine  One dose is recommended after age 88. Measles, mumps, and rubella (MMR) vaccine  You may need at least one dose of MMR if you were born in 1957 or later. You may also need a second dose. Meningococcal  conjugate (MenACWY) vaccine  You may need this if you have certain conditions. Hepatitis A vaccine  You may need this if you have certain conditions or if you travel or work in places where you may be exposed to hepatitis A. Hepatitis B vaccine  You may need this if you have certain conditions or if you travel or work in places where you may be exposed to hepatitis B. Haemophilus influenzae type b (Hib) vaccine  You may need this if you have certain conditions. You may receive vaccines as individual doses or as more than one vaccine together in one shot (combination vaccines). Talk with your health care provider about the risks and benefits of combination vaccines. What tests do I need? Blood tests  Lipid and cholesterol levels. These may be checked every 5 years, or more frequently depending on your overall health.  Hepatitis C test.  Hepatitis B test. Screening  Lung cancer screening. You may have this screening every year starting at age 24 if you have a 30-pack-year history of smoking and currently smoke or have quit within the past 15 years.  Colorectal cancer screening. All adults should have this screening starting at age 52 and continuing until age 61. Your health care provider may recommend screening at age 57 if you are at increased risk. You will have tests every 1-10 years, depending on your results and the type of screening test.  Prostate cancer screening. Recommendations will vary depending on your family history and other risks.  Diabetes screening. This is done by checking your blood sugar (glucose) after you have not eaten for  a while (fasting). You may have this done every 1-3 years.  Abdominal aortic aneurysm (AAA) screening. You may need this if you are a current or former smoker.  Sexually transmitted disease (STD) testing. Follow these instructions at home: Eating and drinking  Eat a diet that includes fresh fruits and vegetables, whole grains, lean  protein, and low-fat dairy products. Limit your intake of foods with high amounts of sugar, saturated fats, and salt.  Take vitamin and mineral supplements as recommended by your health care provider.  Do not drink alcohol if your health care provider tells you not to drink.  If you drink alcohol: ? Limit how much you have to 0-2 drinks a day. ? Be aware of how much alcohol is in your drink. In the [place] one drink equals one 12 oz bottle of beer (355 mL), one 5 oz glass of wine (148 mL), or one 1 oz glass of hard liquor (44 mL). Lifestyle  Take daily care of your teeth and gums.  Stay active. Exercise for at least 30 minutes on 5 or more days each week.  Do not use any products that contain nicotine or tobacco, such as cigarettes, e-cigarettes, and chewing tobacco. If you need help quitting, ask your health care provider.  If you are sexually active, practice safe sex. Use a condom or other form of protection to prevent STIs (sexually transmitted infections).  Talk with your health care provider about taking a low-dose aspirin or statin. What's next?  Visit your health care provider once a year for a well check visit.  Ask your health care provider how often you should have your eyes and teeth checked.  Stay up to date on all vaccines. This information is not intended to replace advice given to you by your health care provider. Make sure you discuss any questions you have with your health care provider. Document Released: 04/23/2015 Document Revised: 03/21/2018 Document Reviewed: 03/21/2018 Elsevier Patient Education  2020 [employer].

## 2018-11-27 NOTE — Telephone Encounter (Signed)
LR, please contact patient and let him know our pharmacy team has reviewed his anticoagulation options and that his current option is the most affordable of the newer medications. Warfarin is less expensive option however would not recommend due to need for frequent monitoring but we can pursue this route if he wishes (would need INR clinic for monitoring).  We could also provide him information if he is eligible for any discount  plans but he would need documentation as described by pharmacy.   My recommendation is to continue current medications.  JE

## 2018-11-27 NOTE — Progress Notes (Signed)
Presents today for [employer] Visit   Date of last exam: 11-14-2018  Interpreter used for this visit? No I connected with  [name] on 11/27/18 by a telephone  application and verified that I am speaking with the correct person using two identifiers.     Patient Care Team: [name], MD as PCP - General (Family Medicine)   Other items to address today:   Discussed Eye/Dental Exam Discussed Immunizations   Other Screening: Last screening for diabetes: 11/12/2017 Last lipid screening: 11/09/2016  ADVANCE DIRECTIVES: Discussed:  yes On File: no Materials Provided: yes (mailed)  Immunization status:  Immunization History  Administered Date(s) Administered  . Influenza Split 02/07/2012  . Influenza,inj,Quad PF,6+ Mos 02/20/2013, 03/27/2014  . Influenza-Unspecified 01/09/2016, 01/02/2018  . Pneumococcal Conjugate-13 06/04/2014  . Pneumococcal Polysaccharide-23 11/09/2016  . Tdap 11/09/2016     Health Maintenance Due  Topic Date Due  . INFLUENZA VACCINE  11/09/2018     Functional Status Survey: Is the patient deaf or have difficulty hearing?: No Does the patient have difficulty seeing, even when wearing glasses/contacts?: No Does the patient have difficulty concentrating, remembering, or making decisions?: No Does the patient have difficulty walking or climbing stairs?: No Does the patient have difficulty dressing or bathing?: No Does the patient have difficulty doing errands alone such as visiting a doctor's office or shopping?: No   6CIT Screen 11/27/2018 11/12/2017  What Year? 0 points 0 points  What month? 0 points 0 points  What time? 0 points 0 points  Count back from 20 0 points 0 points  Months in reverse 0 points 0 points  Repeat phrase 0 points 0 points  Total Score 0 0        Clinical Support from 11/27/2018 in [facility] at [facility]  AUDIT-C Score  6       Home Environment:   Live in one story  No trouble  climbing stairs No grabs in bathroom No clutter clutter/ adequate lighting No scatter rugs    Patient Active Problem List   Diagnosis Date Noted  . Prostate cancer ([place]) 03/27/2014  . Melanoma of skin, site unspecified 10/06/2013  . Unspecified hypothyroidism 10/06/2013  . Unspecified vitamin D deficiency 10/06/2013  . Thyroid condition 11/28/2011     Past Medical History:  Diagnosis Date  . Allergy   . Depression   . Hypothyroidism   . Prostate cancer ([place])   . Pulmonary embolism ([place])   . Skin cancer    melanoma  . Thyroid disease    hypothyroidism     Past Surgical History:  Procedure Laterality Date  . COLONOSCOPY  2014  . melanoma excision from back     . POLYPECTOMY  2014  . PROSTATE SURGERY    . radiation seed implants    . TONSILLECTOMY       Family History  Problem Relation Age of Onset  . Pneumonia Mother   . Liver cancer Father   . Thyroid disease Sister   . Mental retardation Brother   . Lung cancer Brother   . Bone cancer Brother   . Liver cancer Brother   . Colon polyps Neg Hx   . Colon cancer Neg Hx   . Esophageal cancer Neg Hx   . Rectal cancer Neg Hx   . Stomach cancer Neg Hx      Social History   Socioeconomic History  . Marital status: Divorced    Spouse name: Not on  file  . Number of children: Not on file  . Years of education: Not on file  . Highest education level: Not on file  Occupational History  . Not on file  Social Needs  . Financial resource strain: Not on file  . Food insecurity    Worry: Not on file    Inability: Not on file  . Transportation needs    Medical: Not on file    Non-medical: Not on file  Tobacco Use  . Smoking status: Former Smoker    Packs/day: 3.50    Years: 50.00    Pack years: 175.00    Start date: 32    Quit date: 02/03/2016    Years since quitting: 2.8  . Smokeless tobacco: Never Used  . Tobacco comment: cut down to 1 pack when quit in 2017  Substance and Sexual Activity  .  Alcohol use: Yes    Alcohol/week: 20.0 standard drinks    Types: 20 Cans of beer per week  . Drug use: No  . Sexual activity: Not on file  Lifestyle  . Physical activity    Days per week: Not on file    Minutes per session: Not on file  . Stress: Not on file  Relationships  . Social Herbalist on phone: Not on file    Gets together: Not on file    Attends religious service: Not on file    Active member of club or organization: Not on file    Attends meetings of clubs or organizations: Not on file    Relationship status: Not on file  . Intimate partner violence    Fear of current or ex partner: Not on file    Emotionally abused: Not on file    Physically abused: Not on file    Forced sexual activity: Not on file  Other Topics Concern  . Not on file  Social History Narrative  . Not on file     Allergies  Allergen Reactions  . Latex     With prolonged exposure  . Sulfa Antibiotics     Not sure reaction, started in infancy     Prior to Admission medications   Medication Sig Start Date End Date Taking? Authorizing Provider  Cholecalciferol (VITAMIN D PO) Take by mouth daily.   Yes [provider]  diphenhydrAMINE (BENADRYL) 50 MG tablet Take 50 mg by mouth at bedtime as needed for itching.   Yes [provider]  levothyroxine (SYNTHROID) 112 MCG tablet Take 1 tablet (112 mcg total) by mouth daily. 11/14/18  Yes [name], MD  Multiple Vitamins-Minerals (CENTRUM SILVER PO) Take 1 tablet by mouth.   Yes [provider]  Rivaroxaban 15 & 20 MG TBPK Take as directed on package: Start with one 15mg  tablet by mouth twice a day with food. On Day 22, switch to one 20mg  tablet once a day with food. 11/14/18  Yes [name], MD  tadalafil (CIALIS) 10 MG tablet Take 0.5-1 tablets (5-10 mg total) by mouth as needed. 11/14/18  Yes [name], MD  levofloxacin (LEVAQUIN) 500 MG tablet Take 1 tablet (500 mg total) by mouth daily.  Patient not taking: Reported on 11/25/2018 11/08/18   [name], MD     Depression screen [facility] 2/9 11/27/2018 11/14/2018 11/08/2018 11/12/2017 06/12/2017  Decreased Interest 0 0 0 0 0  Down, Depressed, Hopeless 0 0 0 0 0  PHQ - 2 Score 0 0 0 0  0     Fall Risk  11/27/2018 11/14/2018 11/08/2018 11/12/2017 06/12/2017  Falls in the past year? 0 0 0 No No  Number falls in past yr: 0 0 0 - -  Injury with Fall? 0 0 0 - -  Follow up Falls evaluation completed;Education provided;Falls prevention discussed - Falls evaluation completed - -      PHYSICAL EXAM: BP (!) 142/86 Comment: taken from previous visit  Ht 6\' 2"  (1.88 m)   Wt 133 lb (60.3 kg) Comment: taken from previous visit  BMI 17.08 kg/m    Wt Readings from Last 3 Encounters:  11/27/18 133 lb (60.3 kg)  11/25/18 133 lb 9.6 oz (60.6 kg)  11/14/18 134 lb 9.6 oz (61.1 kg)    Medicare annual wellness visit, subsequent     Physical Exam   Education/Counseling provided regarding diet and exercise, prevention of chronic diseases, smoking/tobacco cessation, if applicable, and reviewed "Covered Medicare Preventive Services."

## 2018-11-28 NOTE — Telephone Encounter (Signed)
Called spoke with patient. He states he will continue with the Xarelto. I have also mailed him the patient assistance paperwork suggested by [employer]. I explained to patient fill out highlighted areas and bring back with a print out from his pharmacy with out of pocket expenses.  I also printed a word document with the reminders are it as well sent in the mail with paperwork.  Will await paperwork return.

## 2018-11-29 ENCOUNTER — Other Ambulatory Visit: Payer: Self-pay | Admitting: Cardiothoracic Surgery

## 2018-11-29 ENCOUNTER — Institutional Professional Consult (permissible substitution) (INDEPENDENT_AMBULATORY_CARE_PROVIDER_SITE_OTHER): Payer: Medicare HMO | Admitting: Cardiothoracic Surgery

## 2018-11-29 ENCOUNTER — Other Ambulatory Visit: Payer: Self-pay

## 2018-11-29 VITALS — BP 159/88 | HR 72 | Temp 97.6°F | Resp 20 | Ht 74.0 in | Wt 135.0 lb

## 2018-11-29 DIAGNOSIS — I712 Thoracic aortic aneurysm, without rupture, unspecified: Secondary | ICD-10-CM

## 2018-11-29 NOTE — Progress Notes (Signed)
[address]       [place] [address]             [place] REPORT  Referring Provider is [name], MD Primary Cardiologist is No primary care provider on file. PCP is [name], MD  Chief Complaint  Patient presents with  . Thoracic Aortic Aneurysm    Surgical eval, CTA Chest  11/14/2018    HPI:  73 year old gentleman in previously good health presents for evaluation of incidentally discovered aortic aneurysm.  He was traveling in [place] within the past year and fell striking his right chest.  The pain recurred in July and he sought evaluation in [place].  Radiographic investigation showed a 5 cm ascending aortic aneurysm.  He denies chest pain at rest or with exertion and denies shortness of breath at any point. He denies family history of aneurysm or heart disease.  Past medical history is notable for melanoma and prostate cancers.  Denies hypertension.  Former smoker quit 3 years ago  Past Medical History:  Diagnosis Date  . Allergy   . Depression   . Hypothyroidism   . Prostate cancer ([place])   . Pulmonary embolism ([place])   . Skin cancer    melanoma  . Thyroid disease    hypothyroidism    Past Surgical History:  Procedure Laterality Date  . COLONOSCOPY  2014  . melanoma excision from back     . POLYPECTOMY  2014  . PROSTATE SURGERY    . radiation seed implants    . TONSILLECTOMY      Family History  Problem Relation Age of Onset  . Pneumonia Mother   . Liver cancer Father   . Thyroid disease Sister   . Mental retardation Brother   . Lung cancer Brother   . Bone cancer Brother   . Liver cancer Brother   . Colon polyps Neg Hx   . Colon cancer Neg Hx   . Esophageal cancer Neg Hx   . Rectal cancer Neg Hx   . Stomach cancer Neg Hx     Social History   Socioeconomic History  . Marital status: Divorced    Spouse name: Not on file  . Number of children: Not on file  .  Years of education: Not on file  . Highest education level: Not on file  Occupational History  . Not on file  Social Needs  . Financial resource strain: Not on file  . Food insecurity    Worry: Not on file    Inability: Not on file  . Transportation needs    Medical: Not on file    Non-medical: Not on file  Tobacco Use  . Smoking status: Former Smoker    Packs/day: 3.50    Years: 50.00    Pack years: 175.00    Start date: 76    Quit date: 02/03/2016    Years since quitting: 2.8  . Smokeless tobacco: Never Used  . Tobacco comment: cut down to 1 pack when quit in 2017  Substance and Sexual Activity  . Alcohol use: Yes    Alcohol/week: 20.0 standard drinks    Types: 20 Cans of beer per week  . Drug use: No  . Sexual activity: Not on file  Lifestyle  . Physical activity    Days per week: Not on file    Minutes per session: Not on file  .  Stress: Not on file  Relationships  . Social Herbalist on phone: Not on file    Gets together: Not on file    Attends religious service: Not on file    Active member of club or organization: Not on file    Attends meetings of clubs or organizations: Not on file    Relationship status: Not on file  . Intimate partner violence    Fear of current or ex partner: Not on file    Emotionally abused: Not on file    Physically abused: Not on file    Forced sexual activity: Not on file  Other Topics Concern  . Not on file  Social History Narrative  . Not on file    Current Outpatient Medications  Medication Sig Dispense Refill  . Cholecalciferol (VITAMIN D PO) Take by mouth daily.    . diphenhydrAMINE (BENADRYL) 50 MG tablet Take 50 mg by mouth at bedtime as needed for itching.    . levothyroxine (SYNTHROID) 112 MCG tablet Take 1 tablet (112 mcg total) by mouth daily. 90 tablet 1  . Multiple Vitamins-Minerals (CENTRUM SILVER PO) Take 1 tablet by mouth.    . Rivaroxaban 15 & 20 MG TBPK Take as directed on package: Start with  one 15mg  tablet by mouth twice a day with food. On Day 22, switch to one 20mg  tablet once a day with food. 51 each 0  . tadalafil (CIALIS) 10 MG tablet Take 0.5-1 tablets (5-10 mg total) by mouth as needed. 15 tablet 5  . levofloxacin (LEVAQUIN) 500 MG tablet Take 1 tablet (500 mg total) by mouth daily. (Patient not taking: Reported on 11/29/2018) 10 tablet 0   No current facility-administered medications for this visit.     Allergies  Allergen Reactions  . Latex     With prolonged exposure  . Sulfa Antibiotics     Not sure reaction, started in infancy      Review of Systems:   General:  No change appetite, no change energy,  6 pound weight loss, denies fever  Cardiac:  Per HPI  Respiratory:  Per HPI  GI:    history of colonoscopy 10/19  GU:   History of prostate CA status post seed implant  Vascular:  No pain suggestive of claudication, peripheral aneurysms  Neuro:   Denies stroke/TIA's/seizures,  Musculoskeletal: Negative  Skin:   Negative  Psych:   Negative  Eyes:   Negative  ENT:   negative  Hematologic:  Negative  Endocrine:  Denies diabetes, does not check CBG's at home     Physical Exam:   BP (!) 159/88   Pulse 72   Temp 97.6 F (36.4 C) (Skin)   Resp 20   Ht 6\' 2"  (1.88 m)   Wt 61.2 kg   SpO2 97% Comment: RA  BMI 17.33 kg/m   General:    well-appearing, nad  HEENT:  Unremarkable NCAT  Neck:   no JVD, no bruits, no adenopathy  Chest:   clear to auscultation, symmetrical breath sounds, no wheezes, no rhonchi  CV:   RRR, no  murmur   Abdomen:  soft, non-tender, no masses   Extremities:  warm, well-perfused, pulses 2+, no LE edema  Rectal/GU  Deferred  Neuro:   Grossly non-focal and symmetrical throughout  Skin:   Clean and dry, no rashes, no breakdown   Diagnostic Tests:  I have reviewed his available imaging from 11/14/2018.  This demonstrates a 5 cm aortic aneurysm.  Impression: Intermediate-sized ascending aortic aneurysm with unknown aortic valve  status.   Plan:  2D echocardiogram to evaluate aortic valve.  Assuming tricuspid valve we will follow-up in 6 months with repeat CT scan to define stability of the aneurysm.  He is encouraged to report to the emergency room with any unexplainable chest pain or back pain.   I spent in excess of 45 minutes during the conduct of this office consultation and >50% of this time involved direct face-to-face encounter with the patient for counseling and/or coordination of their care.          Level 3 Office Consult = 40 minutes         Level 4 Office Consult = 60 minutes         Level 5 Office Consult = 80 minutes  B. Murvin Natal, MD 11/29/2018 10:17 AM

## 2018-11-30 ENCOUNTER — Encounter: Payer: Self-pay | Admitting: Family Medicine

## 2018-12-03 ENCOUNTER — Other Ambulatory Visit: Payer: Self-pay

## 2018-12-03 ENCOUNTER — Ambulatory Visit (HOSPITAL_COMMUNITY): Payer: Medicare HMO | Attending: Cardiology

## 2018-12-03 DIAGNOSIS — I712 Thoracic aortic aneurysm, without rupture, unspecified: Secondary | ICD-10-CM

## 2018-12-09 ENCOUNTER — Telehealth: Payer: Self-pay | Admitting: Family Medicine

## 2018-12-09 NOTE — Telephone Encounter (Signed)
Patient states that Dr. Carlota Raspberry gave him a starter pack for xaralto (not on med list). Patient inquired if a RX could be sen to pharmacy on file as he has finished starter pack.

## 2018-12-10 ENCOUNTER — Other Ambulatory Visit: Payer: Self-pay | Admitting: *Deleted

## 2018-12-10 DIAGNOSIS — I2694 Multiple subsegmental pulmonary emboli without acute cor pulmonale: Secondary | ICD-10-CM

## 2018-12-10 MED ORDER — RIVAROXABAN 20 MG PO TABS: 20.0000 mg | ORAL_TABLET | Freq: Every day | ORAL | 1 refills | Status: DC

## 2018-12-10 NOTE — Telephone Encounter (Signed)
Prescription sent

## 2019-01-03 NOTE — Telephone Encounter (Signed)
Patient has appt with pharmacy staff on 10/2 will close message

## 2019-01-10 ENCOUNTER — Ambulatory Visit (INDEPENDENT_AMBULATORY_CARE_PROVIDER_SITE_OTHER): Payer: Medicare HMO | Admitting: Pharmacist

## 2019-01-10 ENCOUNTER — Other Ambulatory Visit: Payer: Self-pay

## 2019-01-10 ENCOUNTER — Ambulatory Visit (INDEPENDENT_AMBULATORY_CARE_PROVIDER_SITE_OTHER): Payer: Medicare HMO

## 2019-01-10 DIAGNOSIS — Z23 Encounter for immunization: Secondary | ICD-10-CM

## 2019-01-10 DIAGNOSIS — Z79899 Other long term (current) drug therapy: Secondary | ICD-10-CM

## 2019-01-10 NOTE — Progress Notes (Signed)
Pharmacy Note  Subjective:  Patient presents today to the [facility] to see the pharmacy team for anticoagulation coverage.  He was given information for patient assistance at last visit. He was able to pick up the starter pack at his local pharmacy and the co-pay was $130.  Objective: Current Outpatient Medications on File Prior to Visit  Medication Sig Dispense Refill  . Cholecalciferol (VITAMIN D PO) Take by mouth daily.    . diphenhydrAMINE (BENADRYL) 50 MG tablet Take 50 mg by mouth at bedtime as needed for itching.    Marland Kitchen levofloxacin (LEVAQUIN) 500 MG tablet Take 1 tablet (500 mg total) by mouth daily. (Patient not taking: Reported on 11/29/2018) 10 tablet 0  . levothyroxine (SYNTHROID) 112 MCG tablet Take 1 tablet (112 mcg total) by mouth daily. 90 tablet 1  . Multiple Vitamins-Minerals (CENTRUM SILVER PO) Take 1 tablet by mouth.    . rivaroxaban (XARELTO) 20 MG TABS tablet Take 1 tablet (20 mg total) by mouth daily with supper. 30 tablet 1  . tadalafil (CIALIS) 10 MG tablet Take 0.5-1 tablets (5-10 mg total) by mouth as needed. 15 tablet 5   No current facility-administered medications on file prior to visit.      Assessment/Plan:  1) Anticoagulation Coverage  He was started on Xarelto in August after being hospitalized with a PE.  He was able to fill the starter pack for $130.  He was referred to the pharmacy team to help patient enroll in patient assistance. Application was mailed to the patient but he has not filled it out.   Reviewed requirements for Xarelto patient assistance.  There is no income requirements listed.  He has a household of 1 and monthly income of $2,700.  He has Medicare and is eligible to apply but needs to have a pharmacy print out of out of pocket prescription expenses.  His PCP prescribed Xarelto.  Instructed patient to have PCP sign provider portion and to apply.  Advised that he may not qualify but still beneficial to apply just in case.  Patient  verbalized understanding.  Patient was counseled on the purpose, proper use, and adverse effects of Xarelto including easy bruising, nosebleeds, gums bleeding when you brush your teeth and more bleeding than normal from small cut. Reviewed signs and symptoms of major bleeding including  Red or dark brown urine, red or black tarry stool,vomiting or coughing up blood, bruises that appear for no known reason, frequent nosebleeds, bleeding gums, or unusual bleeding and any bleeding that does not stop or is very heavy.  Patient verbalized understanding.  All questions encouraged and answered.  Instructed patient to call with any other questions or concerns.  Mariella Saa, PharmD, [place], [place] Clinical Specialty Pharmacist [phone]  01/10/2019 1:35 PM

## 2019-01-10 NOTE — Patient Instructions (Signed)
   Fill out patient assistance forms and return to PCP to send off for approval  Watch for signs and symptoms of bleeding such as coughing up coffee grounds, bright blood in the stool, and black tarry stools  You may stop vitamin D supplement as your multi-vitamin has the amount you need.  Thank you for meeting with the pharmacy team!  Please reach out with any questions or concerns.

## 2019-02-08 ENCOUNTER — Other Ambulatory Visit: Payer: Self-pay | Admitting: Family Medicine

## 2019-02-08 DIAGNOSIS — I2694 Multiple subsegmental pulmonary emboli without acute cor pulmonale: Secondary | ICD-10-CM

## 2019-02-11 ENCOUNTER — Telehealth: Payer: Self-pay | Admitting: Pulmonary Disease

## 2019-02-11 NOTE — Telephone Encounter (Signed)
Called and spoke to pt. Pt states he received a letter about his CT chest that was denied by insurance. Pt thinks this is for the upcoming CT and not a past CT that has been done. Pt is scheduled for the CT chest on 02/19/19.  PCC's please advise on pt's CT and if it is covered by his insurance. Thanks.

## 2019-02-12 NOTE — Telephone Encounter (Signed)
Dr. Loanne Drilling tried to do the Peer to peer told her she needed to call medicare herself, they are supposed to be faxing paperwork. You can let Dr. Loanne Drilling know when this comes in ASAP.  Fax not here at this time.

## 2019-02-12 NOTE — Telephone Encounter (Signed)
I gave Dr Loanne Drilling the info to do the peer to peer Joellen [place]

## 2019-02-14 NOTE — Telephone Encounter (Signed)
I just resumited the records to his insurance to get this precerted we should know by Tuesday pt is aware we will let him know what happens this time once I get some answers Tyler Randall

## 2019-02-14 NOTE — Telephone Encounter (Signed)
What is the status of who needs to be contacted to approve CT Chest for patient's pulmonary mass? He was previously denied because the dx code was for pulmonary embolism and not lung mass in a smoker (99pack years).  As noted before, I called to do a peer to peer review and was instructed to call a different party. Have we received this information yet? Patient's scan is scheduled for next week.   Tammy, could you keep an eye on this? I am out of the office today but will try to check in periodically.

## 2019-02-18 NOTE — Telephone Encounter (Signed)
Please keep me updated on the status of his percert. Can we tag the NP of the day to follow this as well since I am working in the ICU?

## 2019-02-19 ENCOUNTER — Inpatient Hospital Stay: Admission: RE | Admit: 2019-02-19 | Payer: Medicare HMO | Source: Ambulatory Visit

## 2019-02-25 ENCOUNTER — Ambulatory Visit (INDEPENDENT_AMBULATORY_CARE_PROVIDER_SITE_OTHER)
Admission: RE | Admit: 2019-02-25 | Discharge: 2019-02-25 | Disposition: A | Discharge status: Home / Self Care | Payer: Medicare HMO | Source: Ambulatory Visit | Attending: Pulmonary Disease | Admitting: Pulmonary Disease

## 2019-02-25 ENCOUNTER — Other Ambulatory Visit: Payer: Self-pay

## 2019-02-25 DIAGNOSIS — I2699 Other pulmonary embolism without acute cor pulmonale: Secondary | ICD-10-CM

## 2019-02-26 ENCOUNTER — Ambulatory Visit (INDEPENDENT_AMBULATORY_CARE_PROVIDER_SITE_OTHER): Payer: Medicare HMO | Admitting: Pulmonary Disease

## 2019-02-26 ENCOUNTER — Encounter: Payer: Self-pay | Admitting: Pulmonary Disease

## 2019-02-26 ENCOUNTER — Other Ambulatory Visit: Payer: Self-pay

## 2019-02-26 VITALS — BP 128/78 | HR 69 | Temp 97.5°F | Ht 74.0 in | Wt 137.8 lb

## 2019-02-26 DIAGNOSIS — I2699 Other pulmonary embolism without acute cor pulmonale: Secondary | ICD-10-CM

## 2019-02-26 DIAGNOSIS — I824Y2 Acute embolism and thrombosis of unspecified deep veins of left proximal lower extremity: Secondary | ICD-10-CM | POA: Diagnosis not present

## 2019-02-26 NOTE — Progress Notes (Signed)
Subjective:   PATIENT ID: Tyler Randall GENDER: male DOB: Mar 16, 1946, MRN: [id number]   HPI  Chief Complaint  Patient presents with  . Follow-up    ct results    Reason for Visit: Follow-up for pulmonary emboli, lung mass/infarct  [name] is a 73 year old male former smoker with significant tobacco history (>99 pack-years) with history of prostate cancer in 2003 s/p brachytherapy and radiation, remote history of melanoma who presents for follow-up  He has been compliant with his anticoagulation. Denies bruising, bleeding or unhealing wounds. Denies shortness of breath, cough, chest pain. Has been awaiting CT result findings and relieved to hear the lesions are resolving.   Social History: 175 pack years. Quit 3 years ago. [place] vet. [employer]  I have personally reviewed patient's past medical/family/social history/allergies/current medications.  Past Medical History:  Diagnosis Date  . Allergy   . Depression   . Hypothyroidism   . Prostate cancer ([place])   . Pulmonary embolism ([place])   . Skin cancer    melanoma  . Thyroid disease    hypothyroidism     Family History  Problem Relation Age of Onset  . Pneumonia Mother   . Liver cancer Father   . Thyroid disease Sister   . Mental retardation Brother   . Lung cancer Brother   . Bone cancer Brother   . Liver cancer Brother   . Colon polyps Neg Hx   . Colon cancer Neg Hx   . Esophageal cancer Neg Hx   . Rectal cancer Neg Hx   . Stomach cancer Neg Hx      Social History   Occupational History  . Not on file  Tobacco Use  . Smoking status: Former Smoker    Packs/day: 3.50    Years: 50.00    Pack years: 175.00    Start date: 35    Quit date: 02/03/2016    Years since quitting: 3.0  . Smokeless tobacco: Never Used  . Tobacco comment: cut down to 1 pack when quit in 2017  Substance and Sexual Activity  . Alcohol use: Yes    Alcohol/week: 20.0 standard drinks    Types: 20 Cans of beer per week  .  Drug use: No  . Sexual activity: Not on file    Allergies  Allergen Reactions  . Latex     With prolonged exposure  . Sulfa Antibiotics     Not sure reaction, started in infancy     Outpatient Medications Prior to Visit  Medication Sig Dispense Refill  . diphenhydrAMINE (BENADRYL) 50 MG tablet Take 50 mg by mouth at bedtime as needed for itching.    . levothyroxine (SYNTHROID) 112 MCG tablet Take 1 tablet (112 mcg total) by mouth daily. 90 tablet 1  . Multiple Vitamins-Minerals (CENTRUM SILVER PO) Take 1 tablet by mouth.    . tadalafil (CIALIS) 10 MG tablet Take 0.5-1 tablets (5-10 mg total) by mouth as needed. 15 tablet 5  . XARELTO 20 MG TABS tablet TAKE 1 TABLET (20 MG TOTAL) BY MOUTH DAILY WITH SUPPER. 30 tablet 1   No facility-administered medications prior to visit.     Review of Systems  Constitutional: Negative for chills, diaphoresis, fever, malaise/fatigue and weight loss.  HENT: Negative for congestion, ear pain and sore throat.   Respiratory: Negative for cough, hemoptysis, sputum production, shortness of breath and wheezing.   Cardiovascular: Negative for chest pain, palpitations and leg swelling.  Gastrointestinal: Negative for abdominal pain, heartburn  and nausea.  Genitourinary: Negative for frequency.  Musculoskeletal: Negative for joint pain and myalgias.  Skin: Negative for itching and rash.  Neurological: Negative for dizziness, weakness and headaches.  Endo/Heme/Allergies: Does not bruise/bleed easily.  Psychiatric/Behavioral: Negative for depression. The patient is not nervous/anxious.      Objective:   Vitals:   02/26/19 1112  BP: 128/78  Pulse: 69  Temp: (!) 97.5 F (36.4 C)  TempSrc: Temporal  SpO2: 99%  Weight: 137 lb 12.8 oz (62.5 kg)  Height: 6\' 2"  (1.88 m)   SpO2: 99 % O2 Device: None (Room air)  Physical Exam: General: Well-appearing, no acute distress HENT: [place], AT Eyes: EOMI, no scleral icterus Respiratory: Clear to auscultation  bilaterally.  No crackles, wheezing or rales Cardiovascular: RRR, -M/R/G, no JVD GI: BS+, soft, nontender Extremities:-Edema,-tenderness Neuro: AAO x4, CNII-XII grossly intact Skin: Intact, no rashes or bruising Psych: Normal mood, normal affect  Data Reviewed:  Imaging: 11/20/18 LE Doppler - Age-indeterminate DVT involving the iliac, proximal left common femoral vein and sapheno-femoral junction  11/14/18 CTA - pulmonary emboli in the RUL and the RLL. Consolidation on the medial aspect of the RML and the right lower lobe   02/25/19 - CT Chest - improved airspace opacities in RML and RLL. Less likely malignancy and likely represents resolving pulmonary infarct from pulmonary emboli. Stable subpleural RLL nodule. 4.7cm thoracic aortic aneurysm.  PFT: None on file  Labs: CMP Latest Ref Rng & Units 11/08/2018 11/12/2017 11/09/2016  Glucose 65 - 99 mg/dL 100(H) 93 109(H)  BUN 8 - 27 mg/dL 7(L) 11 8  Creatinine 0.76 - 1.27 mg/dL 0.71(L) 0.82 0.75(L)  Sodium 134 - 144 mmol/L 135 143 142  Potassium 3.5 - 5.2 mmol/L 5.0 4.7 4.5  Chloride 96 - 106 mmol/L 90(L) 100 99  CO2 20 - 29 mmol/L 24 24 25   Calcium 8.6 - 10.2 mg/dL 9.7 9.9 9.6  Total Protein 6.0 - 8.5 g/dL 7.2 7.2 7.4  Total Bilirubin 0.0 - 1.2 mg/dL 1.0 1.7(H) 1.4(H)  Alkaline Phos 39 - 117 IU/L 88 76 72  AST 0 - 40 IU/L 24 26 19   ALT 0 - 44 IU/L 18 15 8    Imaging, labs and test noted above have been reviewed independently by me.    Assessment & Plan:   Discussion: 73 year old male with remote history of prostate and melanoma cancer who presents for management of pulmonary emboli. Multiple risk factors including sedentary lifestyle, DVT involving the proximal left femoral and saphenofemoral junction and prior history of malignancy may of contributed to his PE. Will refer to Hem/Onc to comment on additional work-up or need for ongoing anticoagulation.  Pulmonary Emboli --We reviewed your CT Chest which showed resolution of pulmonary  infarct --Complete three month therapy of anticoagulation (Xarelto) --We will refer to Hematology/Oncology to further work-up recommendations regarding pulmonary emboli work-up with history of remote cancers --Scheduled for skin exam next month with Dermatologist --Obtain repeat left lower extremity ultrasound  Pulmonary infarct - resolving  Repeat CT CHEST in 3 months   Thoracic aortic aneurysm measured 4.7cm Will CC PCP to imaging results to follow  Health Maintenance Immunization History  Administered Date(s) Administered  . Fluad Quad(high Dose 65+) 01/10/2019  . Influenza Split 02/07/2012  . Influenza,inj,Quad PF,6+ Mos 02/20/2013, 03/27/2014  . Influenza-Unspecified 01/09/2016, 01/02/2018  . Pneumococcal Conjugate-13 06/04/2014  . Pneumococcal Polysaccharide-23 11/09/2016  . Tdap 11/09/2016    Orders Placed This Encounter  Procedures  . Ambulatory referral to Hematology /  Oncology    Referral Priority:   Routine    Referral Type:   Consultation    Referral Reason:   Specialty Services Required    Number of Visits Requested:   1  No orders of the defined types were placed in this encounter.   Return in about 3 months (around 05/29/2019).  [place], MD [facility] 02/26/2019 11:21 AM  Office Number [phone]

## 2019-02-26 NOTE — Patient Instructions (Addendum)
Pulmonary Emboli --We reviewed your CT Chest which showed resolution of pulmonary infarct --Complete three month therapy of anticoagulation (Xarelto) --We will refer to Hematology/Oncology to further work-up recommendations regarding pulmonary emboli work-up with history of remote cancers --Obtain repeat left lower extremity ultrasound  Follow-up with me as needed

## 2019-02-27 ENCOUNTER — Other Ambulatory Visit: Payer: Self-pay | Admitting: Pulmonary Disease

## 2019-02-27 ENCOUNTER — Ambulatory Visit: Payer: Medicare HMO | Admitting: Pulmonary Disease

## 2019-02-27 ENCOUNTER — Telehealth: Payer: Self-pay | Admitting: Hematology

## 2019-02-27 DIAGNOSIS — I2699 Other pulmonary embolism without acute cor pulmonale: Secondary | ICD-10-CM

## 2019-02-27 NOTE — Telephone Encounter (Signed)
Received a new hem referral from Dr. Loanne Drilling at [facility] for DVT. Tyler Randall has been cld and scheduled to see Dr. Irene Limbo on 12/14 at 10am. Pt aware to arrive 15 minutes early.

## 2019-03-02 DIAGNOSIS — I824Y2 Acute embolism and thrombosis of unspecified deep veins of left proximal lower extremity: Secondary | ICD-10-CM | POA: Insufficient documentation

## 2019-03-02 DIAGNOSIS — I2699 Other pulmonary embolism without acute cor pulmonale: Secondary | ICD-10-CM | POA: Insufficient documentation

## 2019-03-03 ENCOUNTER — Other Ambulatory Visit: Payer: Self-pay

## 2019-03-03 ENCOUNTER — Ambulatory Visit (HOSPITAL_COMMUNITY)
Admission: RE | Admit: 2019-03-03 | Discharge: 2019-03-03 | Disposition: A | Discharge status: Home / Self Care | Payer: Medicare HMO | Source: Ambulatory Visit | Attending: Cardiology | Admitting: Cardiology

## 2019-03-03 DIAGNOSIS — I824Y2 Acute embolism and thrombosis of unspecified deep veins of left proximal lower extremity: Secondary | ICD-10-CM

## 2019-03-05 ENCOUNTER — Telehealth: Payer: Self-pay | Admitting: Hematology

## 2019-03-05 NOTE — Telephone Encounter (Signed)
Returned patient's phone call regarding rescheduling an appointment, voicemail not set up.

## 2019-03-05 NOTE — Telephone Encounter (Signed)
Received a msg to reschedule Tyler Randall 12/14 appt. I attempted to call the pt, but was unable to reach him. His vm didn't pickup.

## 2019-03-07 ENCOUNTER — Telehealth: Payer: Self-pay | Admitting: Hematology

## 2019-03-07 NOTE — Telephone Encounter (Signed)
I returned Mr. Gacke call to reschedule his appt to see Dr. Irene Limbo on 12/16 at 10am.

## 2019-03-18 DIAGNOSIS — Z85820 Personal history of malignant melanoma of skin: Secondary | ICD-10-CM | POA: Diagnosis not present

## 2019-03-18 DIAGNOSIS — D2262 Melanocytic nevi of left upper limb, including shoulder: Secondary | ICD-10-CM | POA: Diagnosis not present

## 2019-03-18 DIAGNOSIS — D2261 Melanocytic nevi of right upper limb, including shoulder: Secondary | ICD-10-CM | POA: Diagnosis not present

## 2019-03-18 DIAGNOSIS — L821 Other seborrheic keratosis: Secondary | ICD-10-CM | POA: Diagnosis not present

## 2019-03-18 DIAGNOSIS — D225 Melanocytic nevi of trunk: Secondary | ICD-10-CM | POA: Diagnosis not present

## 2019-03-18 DIAGNOSIS — Z85828 Personal history of other malignant neoplasm of skin: Secondary | ICD-10-CM | POA: Diagnosis not present

## 2019-03-18 DIAGNOSIS — L570 Actinic keratosis: Secondary | ICD-10-CM | POA: Diagnosis not present

## 2019-03-24 ENCOUNTER — Encounter: Payer: Medicare HMO | Admitting: Hematology

## 2019-03-26 ENCOUNTER — Inpatient Hospital Stay: Payer: Medicare HMO

## 2019-03-26 ENCOUNTER — Other Ambulatory Visit: Payer: Self-pay

## 2019-03-26 ENCOUNTER — Inpatient Hospital Stay: Payer: Medicare HMO | Attending: Hematology | Admitting: Hematology

## 2019-03-26 ENCOUNTER — Telehealth: Payer: Self-pay | Admitting: Hematology

## 2019-03-26 VITALS — BP 163/89 | HR 102 | Temp 98.0°F | Resp 18 | Ht 74.0 in | Wt 131.5 lb

## 2019-03-26 DIAGNOSIS — I2694 Multiple subsegmental pulmonary emboli without acute cor pulmonale: Secondary | ICD-10-CM | POA: Diagnosis not present

## 2019-03-26 DIAGNOSIS — Z7901 Long term (current) use of anticoagulants: Secondary | ICD-10-CM | POA: Diagnosis not present

## 2019-03-26 DIAGNOSIS — R109 Unspecified abdominal pain: Secondary | ICD-10-CM | POA: Diagnosis not present

## 2019-03-26 DIAGNOSIS — Z87891 Personal history of nicotine dependence: Secondary | ICD-10-CM | POA: Insufficient documentation

## 2019-03-26 DIAGNOSIS — F329 Major depressive disorder, single episode, unspecified: Secondary | ICD-10-CM | POA: Diagnosis not present

## 2019-03-26 DIAGNOSIS — D751 Secondary polycythemia: Secondary | ICD-10-CM | POA: Insufficient documentation

## 2019-03-26 DIAGNOSIS — R69 Illness, unspecified: Secondary | ICD-10-CM | POA: Diagnosis not present

## 2019-03-26 DIAGNOSIS — Z8546 Personal history of malignant neoplasm of prostate: Secondary | ICD-10-CM | POA: Insufficient documentation

## 2019-03-26 DIAGNOSIS — Z79899 Other long term (current) drug therapy: Secondary | ICD-10-CM | POA: Diagnosis not present

## 2019-03-26 DIAGNOSIS — R918 Other nonspecific abnormal finding of lung field: Secondary | ICD-10-CM | POA: Insufficient documentation

## 2019-03-26 DIAGNOSIS — Z85820 Personal history of malignant melanoma of skin: Secondary | ICD-10-CM | POA: Diagnosis not present

## 2019-03-26 DIAGNOSIS — D6859 Other primary thrombophilia: Secondary | ICD-10-CM

## 2019-03-26 DIAGNOSIS — Z923 Personal history of irradiation: Secondary | ICD-10-CM

## 2019-03-26 DIAGNOSIS — M549 Dorsalgia, unspecified: Secondary | ICD-10-CM | POA: Diagnosis not present

## 2019-03-26 DIAGNOSIS — Z86711 Personal history of pulmonary embolism: Secondary | ICD-10-CM | POA: Insufficient documentation

## 2019-03-26 DIAGNOSIS — Z86718 Personal history of other venous thrombosis and embolism: Secondary | ICD-10-CM | POA: Insufficient documentation

## 2019-03-26 DIAGNOSIS — E039 Hypothyroidism, unspecified: Secondary | ICD-10-CM | POA: Diagnosis not present

## 2019-03-26 DIAGNOSIS — D45 Polycythemia vera: Secondary | ICD-10-CM

## 2019-03-26 LAB — CBC WITH DIFFERENTIAL/PLATELET
Abs Immature Granulocytes: 0.01 10*3/uL (ref 0.00–0.07)
Basophils Absolute: 0 10*3/uL (ref 0.0–0.1)
Basophils Relative: 1 %
Eosinophils Absolute: 0 10*3/uL (ref 0.0–0.5)
Eosinophils Relative: 0 %
HCT: 64.3 % — ABNORMAL HIGH (ref 39.0–52.0)
Hemoglobin: 21.3 g/dL (ref 13.0–17.0)
Immature Granulocytes: 0 %
Lymphocytes Relative: 17 %
Lymphs Abs: 1 10*3/uL (ref 0.7–4.0)
MCH: 30.6 pg (ref 26.0–34.0)
MCHC: 33.1 g/dL (ref 30.0–36.0)
MCV: 92.3 fL (ref 80.0–100.0)
Monocytes Absolute: 0.6 10*3/uL (ref 0.1–1.0)
Monocytes Relative: 11 %
Neutro Abs: 4.2 10*3/uL (ref 1.7–7.7)
Neutrophils Relative %: 71 %
Platelets: 194 10*3/uL (ref 150–400)
RBC: 6.97 MIL/uL — ABNORMAL HIGH (ref 4.22–5.81)
RDW: 17.1 % — ABNORMAL HIGH (ref 11.5–15.5)
WBC: 5.8 10*3/uL (ref 4.0–10.5)
nRBC: 0 % (ref 0.0–0.2)

## 2019-03-26 LAB — CMP (CANCER CENTER ONLY)
ALT: 17 U/L (ref 0–44)
AST: 29 U/L (ref 15–41)
Albumin: 4.6 g/dL (ref 3.5–5.0)
Alkaline Phosphatase: 74 U/L (ref 38–126)
Anion gap: 14 (ref 5–15)
BUN: 12 mg/dL (ref 8–23)
CO2: 28 mmol/L (ref 22–32)
Calcium: 9.6 mg/dL (ref 8.9–10.3)
Chloride: 99 mmol/L (ref 98–111)
Creatinine: 0.86 mg/dL (ref 0.61–1.24)
GFR, Est AFR Am: >60 mL/min (ref >60)
GFR, Estimated: >60 mL/min (ref >60)
Glucose, Bld: 93 mg/dL (ref 70–99)
Potassium: 4.5 mmol/L (ref 3.5–5.1)
Sodium: 141 mmol/L (ref 135–145)
Total Bilirubin: 2 mg/dL — ABNORMAL HIGH (ref 0.3–1.2)
Total Protein: 8 g/dL (ref 6.5–8.1)

## 2019-03-26 LAB — ANTITHROMBIN III: AntiThromb III Func: 89 % (ref 75–120)

## 2019-03-26 MED ORDER — RIVAROXABAN 20 MG PO TABS: 20.0000 mg | ORAL_TABLET | Freq: Every day | ORAL | 1 refills | Status: DC

## 2019-03-26 NOTE — Telephone Encounter (Signed)
Scheduled appt per 12/16 los.  Printed calendar and avs. 

## 2019-03-26 NOTE — Progress Notes (Signed)
HEMATOLOGY/ONCOLOGY CONSULTATION NOTE  Date of Service: 03/26/2019  Patient Care Team: [name], MD as PCP - General (Family Medicine)  CHIEF COMPLAINTS/PURPOSE OF CONSULTATION:  DVT/PE  HISTORY OF PRESENTING ILLNESS:   Tyler Randall is a wonderful 73 y.o. male who has been referred to [place] by Dr Loanne Drilling for evaluation and management of DVT/PE. The pt reports that he is doing well overall.  The pt reports that he has never had a blood clot prior the clot found in 11/2018. He denies any family history of blood clots or other blood disorders. In July pt began to have a sharp pain in his right chest. It reminded him of a similar pain he had when he fell on his chest a year prior. He sustained no serious injuries from this fall and the pain went away on its own. When the pain began this July pt did not feel the need to go to the ED and waited almost a week to see Dr. Carlota Raspberry, his PCP. This pain disappeared by August and he did not have any leg swelling, pain, or cramps during this period at all. He is still not having any symptoms in his leg. He was not given any indication as to the cause of his blood clot. Pt was placed on Xarelto for 3 months and was told that it was okay to discontinue in November. He has not taken Xarelto in about a month.   Pt quit smoking 3 years ago and currently drinking 4-5 drinks per day. He denies any injuries to his left leg or long-distance travel around or before August. He lost about 15 lbs since August. Prior to his weight loss he stopped taking Mirtazapine, which he had been taking for nearly 25 years. He stopped all at once and immediately noticed a reduction in appetite and increased sleeplessness. He now having to take two Benadryl at night to help him sleep. His previous PCP, Dr. Osvaldo Angst, was managing his depression. Pt denies any current symptoms of depression and has not asked Dr. Carlota Raspberry to take over this care. Pt feels that his weight loss has stabilized  at this time. He also began walking 2 miles every morning a few months ago and was previously more sedentary. 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 There was some chemical exposure involved. Pt has not had any recent surgical procedures. Pt reports that his hypothyroidism has been stable. He has been having some back pain but he attributes this to age and certain body positions that holds.   In 2003 pt found 1 area of superficial melanoma on his skin. Dr. Osvaldo Angst found it and sent pt to [facility]. Pt had an Wide Local Excision of the lesion and has had no recurrence since. Pt had his latest skin cancer screening last month. He was also diagnosed with Prostate Cancer later in 2003. It early stage disease that was treated with radiation seeds. Pt was being followed by a Urologist but because of insurance changes caused by retirement Dr. Carlota Raspberry is currently monitoring his prostate. Pt will see Dr. Carlota Raspberry in February. Pt had his last Colonoscopy in 01/2018. They recommended a rpt Colonoscopy 6 months later but pt declined.   Of note prior to the patient's visit today, pt has had VAS [place] Lower Extremity Venous Left ([id number]) completed on 03/03/2019 with results revealing "Right: No evidence of common femoral vein obstruction. 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 No cystic structure found in the popliteal fossa. All other veins visualized appear fully compressible and demonstrate appropriate Doppler characteristics. The common and external iliac veins are widely patent with no evidence of thrombus. Findings appear improved from previous examination."   Pt has had CT Chest ([id number]) completed on 02/25/2019 with results revealing "1. No acute abnormality. 2. Improving opacities in the right middle lobe and right lower lobe. These are favored to represent sequela of pneumonia  versus pulmonary infarcts in the setting of a prior acute pulmonary embolus. Consider a 6 month follow-up CT to confirm complete resolution of these findings. 3. Again noted is a 4.7 cm ascending thoracic aortic aneurysm. Ascending thoracic aortic aneurysm. Recommend semi-annual imaging followup by CTA or MRA and referral to cardiothoracic surgery if not already obtained."  Pt has had VAS [place] Lower Extremity Venous B/L ([id number]) completed on 11/20/2018 with results revealing "Right: There is no evidence of deep vein thrombosis in the lower extremity. No cystic structure found in the popliteal fossa. Left: Findings consistent with age indeterminate deep vein thrombosis involving the iliac, proximal left common femoral vein and sapheno-femoral junction No cystic structure found in the popliteal fossa."  Pt has had CT Angio Chest ([id number]) completed on 11/14/2018  with results revealing "1. There are pulmonary emboli on the right with an incompletely obstructing pulmonary embolus in the proximal right lower lobe pulmonary artery which may be chronic given its contour and overall appearance. A small pulmonary embolus involving a posterior segment right upper lobe pulmonary artery branch also noted. No other pulmonary emboli evident. No pulmonary embolus to the left heart strain of midline evident. 2. Ascending thoracic aorta has a measured diameter of 4.7 x 4.6 cm. No evident dissection. Ascending thoracic aortic aneurysm. Recommend semi-annual imaging followup by CTA or MRA and referral to cardiothoracic surgery if not already obtained. Aortic aneurysm NOS (ICD10-I71.9). There are foci of aortic atherosclerosis. 3. Areas of suspected pneumonia in the medial segment right middle lobe and portions of the peripheral anterior and lateral segments of the right lower lobe. No new airspace consolidation. Stable 5 mm nodular opacity abutting the pleura in the superior segment right lower lobe. No follow-up needed if  patient is low-risk. Non-contrast chest CT can be considered in 12 months if patient is high-risk. 4.  No evident thoracic adenopathy. Aortic aneurysm NOS (ICD10-I71.9).6."  Most recent lab results (04/28/2015) of POCT CBC and CMP is as follows: all values are WNL except for RBC at 4.35, HCT/POC at 40.6, MCH/POC at 32.7, Total Bilirubin at 1.3.  On review of systems, pt reports back pain and denies bowel movement issues, problems urinating, issues swallowing, headaches, changes in vision, testicular pain/swelling, calf pain/swelling/redness, lower left leg pain/swelling/redness and any other symptoms.   On PMHx the pt reports Prostate Cancer, Melanoma, Hypothyroidism, Depression. On Social Hx the pt reports that he quit smoking 3 years ago, currently drinks 4-5 alcoholic beverages per day.   MEDICAL HISTORY:  Past Medical History:  Diagnosis Date  . Allergy   . Depression   . Hypothyroidism   . Prostate cancer ([place])   . Pulmonary embolism ([place])   . Skin cancer    melanoma  . Thyroid disease    hypothyroidism    SURGICAL HISTORY: Past Surgical History:  Procedure Laterality Date  . COLONOSCOPY  2014  . melanoma excision from back     . POLYPECTOMY  2014  . PROSTATE SURGERY    .  radiation seed implants    . TONSILLECTOMY      SOCIAL HISTORY: Social History   Socioeconomic History  . Marital status: Divorced    Spouse name: Not on file  . Number of children: Not on file  . Years of education: Not on file  . Highest education level: Not on file  Occupational History  . Not on file  Tobacco Use  . Smoking status: Former Smoker    Packs/day: 3.50    Years: 50.00    Pack years: 175.00    Start date: 56    Quit date: 02/03/2016    Years since quitting: 3.1  . Smokeless tobacco: Never Used  . Tobacco comment: cut down to 1 pack when quit in 2017  Substance and Sexual Activity  . Alcohol use: Yes    Alcohol/week: 20.0 standard drinks    Types: 20 Cans of beer per  week  . Drug use: No  . Sexual activity: Not on file  Other Topics Concern  . Not on file  Social History Narrative  . Not on file   Social Determinants of Health   Financial Resource Strain:   . Difficulty of Paying Living Expenses: Not on file  Food Insecurity:   . Worried About Charity fundraiser in the Last Year: Not on file  . Ran Out of Food in the Last Year: Not on file  Transportation Needs:   . Lack of Transportation (Medical): Not on file  . Lack of Transportation (Non-Medical): Not on file  Physical Activity:   . Days of Exercise per Week: Not on file  . Minutes of Exercise per Session: Not on file  Stress:   . Feeling of Stress : Not on file  Social Connections:   . Frequency of Communication with Friends and Family: Not on file  . Frequency of Social Gatherings with Friends and Family: Not on file  . Attends Religious Services: Not on file  . Active Member of Clubs or Organizations: Not on file  . Attends Archivist Meetings: Not on file  . Marital Status: Not on file  Intimate Partner Violence:   . Fear of Current or Ex-Partner: Not on file  . Emotionally Abused: Not on file  . Physically Abused: Not on file  . Sexually Abused: Not on file    FAMILY HISTORY: Family History  Problem Relation Age of Onset  . Pneumonia Mother   . Liver cancer Father   . Thyroid disease Sister   . Mental retardation Brother   . Lung cancer Brother   . Bone cancer Brother   . Liver cancer Brother   . Colon polyps Neg Hx   . Colon cancer Neg Hx   . Esophageal cancer Neg Hx   . Rectal cancer Neg Hx   . Stomach cancer Neg Hx     ALLERGIES:  is allergic to latex and sulfa antibiotics.  MEDICATIONS:  Current Outpatient Medications  Medication Sig Dispense Refill  . diphenhydrAMINE (BENADRYL) 50 MG tablet Take 50 mg by mouth at bedtime as needed for itching.    . levothyroxine (SYNTHROID) 112 MCG tablet Take 1 tablet (112 mcg total) by mouth daily. 90 tablet 1   . Multiple Vitamins-Minerals (CENTRUM SILVER PO) Take 1 tablet by mouth.    . tadalafil (CIALIS) 10 MG tablet Take 0.5-1 tablets (5-10 mg total) by mouth as needed. 15 tablet 5  . XARELTO 20 MG TABS tablet TAKE 1 TABLET (20 MG TOTAL) BY  MOUTH DAILY WITH SUPPER. 30 tablet 1   No current facility-administered medications for this visit.    REVIEW OF SYSTEMS:    10 Point review of Systems was done is negative except as noted above.  PHYSICAL EXAMINATION: ECOG PERFORMANCE STATUS: 1 - Symptomatic but completely ambulatory  . Vitals:   03/26/19 1043  BP: (!) 163/89  Pulse: (!) 102  Resp: 18  Temp: 98 F (36.7 C)  SpO2: 97%   Filed Weights   03/26/19 1043  Weight: 131 lb 8 oz (59.6 kg)   .Body mass index is 16.88 kg/m.  GENERAL:alert, in no acute distress and comfortable SKIN: no acute rashes, no significant lesions EYES: conjunctiva are pink and non-injected, sclera anicteric OROPHARYNX: MMM, no exudates, no oropharyngeal erythema or ulceration NECK: supple, no JVD LYMPH:  no palpable lymphadenopathy in the cervical, axillary or inguinal regions LUNGS: clear to auscultation b/l with normal respiratory effort HEART: regular rate & rhythm ABDOMEN:  normoactive bowel sounds , non tender, not distended. Extremity: no pedal edema PSYCH: alert & oriented x 3 with fluent speech NEURO: no focal motor/sensory deficits  LABORATORY DATA:  I have reviewed the data as listed  . CBC Latest Ref Rng & Units 04/28/2015 05/28/2014 03/27/2014  WBC 4.6 - 10.2 K/uL 4.7 11.2(H) 7.5  Hemoglobin 14.1 - 18.1 g/dL 14.2 15.8 16.0  Hematocrit 43.5 - 53.7 % 40.6(A) 47.6 48.4  Platelets 150 - 400 K/uL - 210 -    . CMP Latest Ref Rng & Units 11/08/2018 11/12/2017 11/09/2016  Glucose 65 - 99 mg/dL 100(H) 93 109(H)  BUN 8 - 27 mg/dL 7(L) 11 8  Creatinine 0.76 - 1.27 mg/dL 0.71(L) 0.82 0.75(L)  Sodium 134 - 144 mmol/L 135 143 142  Potassium 3.5 - 5.2 mmol/L 5.0 4.7 4.5  Chloride 96 - 106 mmol/L  90(L) 100 99  CO2 20 - 29 mmol/L 24 24 25   Calcium 8.6 - 10.2 mg/dL 9.7 9.9 9.6  Total Protein 6.0 - 8.5 g/dL 7.2 7.2 7.4  Total Bilirubin 0.0 - 1.2 mg/dL 1.0 1.7(H) 1.4(H)  Alkaline Phos 39 - 117 IU/L 88 76 72  AST 0 - 40 IU/L 24 26 19   ALT 0 - 44 IU/L 18 15 8      RADIOGRAPHIC STUDIES: I have personally reviewed the radiological images as listed and agreed with the findings in the report. CT Chest Wo Contrast  Result Date: 02/25/2019 CLINICAL DATA:  Pulmonary infarct. EXAM: CT CHEST WITHOUT CONTRAST TECHNIQUE: Multidetector CT imaging of the chest was performed following the standard protocol without IV contrast. COMPARISON:  11/14/2018. FINDINGS: Cardiovascular: There is an ascending thoracic aortic aneurysm measuring approximately 4.7 cm. Thoracic aortic calcifications are noted. The heart size is normal. There is no pericardial effusion. The main pulmonary artery is not significantly dilated. Mediastinum/Nodes: --No mediastinal or hilar lymphadenopathy. --No axillary lymphadenopathy. --No supraclavicular lymphadenopathy. --Normal thyroid gland. --The esophagus is unremarkable Lungs/Pleura: There is a stable subpleural nodule in the right lower lobe. There are persistent but improving airspace opacities both in the right middle lobe and right lower lobe. There is no new worrisome opacity. No pneumothorax. No large pleural effusion. The trachea is unremarkable. Upper Abdomen: No acute abnormality. Musculoskeletal: No chest wall abnormality. No acute or significant osseous findings. IMPRESSION: 1. No acute abnormality. 2. Improving opacities in the right middle lobe and right lower lobe. These are favored to represent sequela of pneumonia versus pulmonary infarcts in the setting of a prior acute pulmonary embolus. Consider a 6 month  follow-up CT to confirm complete resolution of these findings. 3. Again noted is a 4.7 cm ascending thoracic aortic aneurysm. Ascending thoracic aortic aneurysm.  Recommend semi-annual imaging followup by CTA or MRA and referral to cardiothoracic surgery if not already obtained. This recommendation follows 2010 ACCF/AHA/AATS/ACR/ASA/SCA/SCAI/SIR/STS/SVM Guidelines for the Diagnosis and Management of Patients With Thoracic Aortic Disease. Circulation. 2010; [id number]. Aortic aneurysm NOS (ICD10-I71.9) Aortic Atherosclerosis (ICD10-I70.0). Electronically Signed   By: Constance Holster M.D.   On: 02/25/2019 20:46   VAS [place] LOWER EXTREMITY VENOUS (DVT)  Result Date: 03/05/2019  Lower Venous Study Indications: History of left lower extremity DVT and right PE. Patient denies SOB.  Risk Factors: Confirmed PE DVT in the left lower extremity in 11/2018. Anticoagulation: Xarelto.  Comparison Study: In 11/2018, a venous duplex showed no evidence of deep vein                   thrombosis in the right lower extremity and findings                   consistent with age indeterminate deep vein thrombosis                   involving the left iliac, proximal left common femoral vein                   and saphenofemoral junction. In 11/2018, a CTA showed evidence                   of right PE. Performing Technologist: Sharlett Iles RVT  Examination Guidelines: A complete evaluation includes B-mode imaging, spectral Doppler, color Doppler, and power Doppler as needed of all accessible portions of each vessel. Bilateral testing is considered an integral part of a complete examination. Limited examinations for reoccurring indications may be performed as noted.  +-----+---------------+---------+-----------+----------+--------------+ RIGHTCompressibilityPhasicitySpontaneityPropertiesThrombus Aging +-----+---------------+---------+-----------+----------+--------------+ CFV  Full           Yes      Yes                                 +-----+---------------+---------+-----------+----------+--------------+   +---------+---------------+---------+-----------+---------------+-------------+ LEFT     CompressibilityPhasicitySpontaneityProperties     Thrombus                                                                 Aging         +---------+---------------+---------+-----------+---------------+-------------+ CFV      Full           Yes      Yes                                     +---------+---------------+---------+-----------+---------------+-------------+ SFJ      Full           Yes      Yes                                     +---------+---------------+---------+-----------+---------------+-------------+ FV Prox  Full  Yes      Yes                                     +---------+---------------+---------+-----------+---------------+-------------+ FV Mid   Full                                                            +---------+---------------+---------+-----------+---------------+-------------+ FV DistalFull           Yes      Yes                                     +---------+---------------+---------+-----------+---------------+-------------+ PFV      Full           Yes      Yes                                     +---------+---------------+---------+-----------+---------------+-------------+ POP      Full           Yes      Yes                                     +---------+---------------+---------+-----------+---------------+-------------+ PTV      Full                    No                                      +---------+---------------+---------+-----------+---------------+-------------+ PERO     Partial        No       No         brightly       Chronic                                                   echogenic                    +---------+---------------+---------+-----------+---------------+-------------+ Gastroc  Full                                                             +---------+---------------+---------+-----------+---------------+-------------+ GSV      Full           Yes      Yes                                     +---------+---------------+---------+-----------+---------------+-------------+   Summary: Right: No evidence of common femoral vein obstruction. Left: Findings consistent with  chronic deep vein thrombosis involving one of the left peroneal veins. No cystic structure found in the popliteal fossa. All other veins visualized appear fully compressible and demonstrate appropriate Doppler characteristics. The common and external iliac veins are widely patent with no evidence of thrombus. Findings appear improved from previous examination.  *See table(s) above for measurements and observations. Electronically signed by Kathlyn Sacramento MD on 03/05/2019 at 8:25:34 AM.    Final     ASSESSMENT & PLAN:   73 yo with    1) Unprovoked Extensive Left lower extremity DVT extending down from iliac vein - age indeterminate. [place] 11/2018 Persistent chronic DVT noted on rpt [place] 03/03/2019 in peroneal.  2) Unprovoked pulmonary emboli 11/2018  PLAN: -Discussed patient's most recent labs from 04/28/2015, POCT CBC and CMP is as follows: all values are WNL except for RBC at 4.35, HCT/POC at 40.6, MCH/POC at 32.7, Total Bilirubin at 1.3. -Discussed 11/20/2018 VAS [place] Lower Extremity Venous B/L ([id number]) which revealed "Left: Findings consistent with age indeterminate deep vein thrombosis involving the iliac, proximal left common femoral vein and sapheno-femoral junction No cystic structure found in the popliteal fossa." -Discussed 03/03/2019 VAS [place] Lower Extremity Venous Left ([id number]) which revealed "Findings consistent with chronic deep vein thrombosis involving one of the left peroneal veins. Findings appear improved from previous examination."  -Discussed 11/14/2018 CT Angio Chest ([id number]) which revealed "1. There are pulmonary emboli on the right with an  incompletely obstructing pulmonary embolus in the proximal right lower lobe pulmonary artery which may be chronic given its contour and overall appearance. A small pulmonary embolus involving a posterior segment right upper lobe pulmonary artery branch also noted. No other pulmonary emboli evident. No pulmonary embolus to the left heart strain of midline evident. 2. Ascending thoracic aorta has a measured diameter of 4.7 x 4.6 cm. No evident dissection. Ascending thoracic aortic aneurysm." -Discussed 02/25/2019 CT Chest ([id number]) which revealed "2. Improving opacities in the right middle lobe and right lower lobe. These are favored to represent sequela of pneumonia versus pulmonary infarcts in the setting of a prior acute pulmonary embolus. 3. Again noted is a 4.7 cm ascending thoracic aortic aneurysm." -Advised pt that longer term anticoagulation is necessary to allow the veins to heal, if not, may cause recurrent blood clots -Advised pt that risk for recurrent blood clot is highest in the first year  -Advised pt that alcohol can increase the risks of bleeding while on anticoagulation therapy  -Recommend continued smoking cessation, reducing alcohol intake, using compression socks, avoiding long-distance travel, avoiding crossing legs, standing up and walking around at least every hour while sitting -Recommended pt stay up to date with age appropriate cancer screenings  -Pt had a skin cancer screening in the last month, last Colonoscopy in 01/2018 -Recommended pt continue Xarelto long-term due to no provoking event identified -Would consider moving to maintenance dose of Xarelto anticoagulation after 6-9 months of full dose -Continue f/u with Dr. Carlota Raspberry, as scheduled for 05/2019 -Rx Xarelto, 20 mg per day -Will get labs today - hypercoagulability workup considering unprovoked VTE -Will get a CT Abd/Pel in 2 weeks -Will see back in 3 weeks via phone   FOLLOW UP: Labs today CT abd/pelvis in 2  weeks Phone visit with Dr Irene Limbo in 3 weeks  . Orders Placed This Encounter  Procedures  . CT Abdomen Pelvis W Contrast    Standing Status:   Future    Standing Expiration Date:   03/25/2020    Order Specific Question:   **  REASON FOR EXAM (FREE TEXT)    Answer:   patient with extensive left iliac and femoral thrombosis - unprovoked and vague abdominal discomfort and weight loss r/o malignancy    Order Specific Question:   If indicated for the ordered procedure, I authorize the administration of contrast media per Radiology protocol    Answer:   Yes    Order Specific Question:   Preferred imaging location?    Answer:   [facility]    Order Specific Question:   Release to patient    Answer:   Immediate    Order Specific Question:   Is Oral Contrast requested for this exam?    Answer:   Yes, Per Radiology protocol    Order Specific Question:   Radiology Contrast Protocol - do NOT remove file path    Answer:   [email or web address]  . CBC with Differential/Platelet    Standing Status:   Future    Number of Occurrences:   1    Standing Expiration Date:   04/29/2020  . CMP ([place] only)    Standing Status:   Future    Number of Occurrences:   1    Standing Expiration Date:   03/25/2020  . JAK2 (including V617F and Exon 12), MPL, and CALR-Next Generation Sequencing    Standing Status:   Future    Number of Occurrences:   1    Standing Expiration Date:   03/25/2020  . Antithrombin III    Standing Status:   Future    Number of Occurrences:   1    Standing Expiration Date:   03/25/2020  . Protein C activity    Standing Status:   Future    Number of Occurrences:   1    Standing Expiration Date:   03/25/2020  . Protein C, total    Standing Status:   Future    Number of Occurrences:   1    Standing Expiration Date:   03/25/2020  . Protein S activity    Standing Status:   Future    Number of Occurrences:   1    Standing Expiration Date:    03/25/2020  . Protein S, total    Standing Status:   Future    Number of Occurrences:   1    Standing Expiration Date:   03/25/2020  . Lupus anticoagulant panel    Standing Status:   Future    Number of Occurrences:   1    Standing Expiration Date:   03/25/2020  . Beta-2-glycoprotein i abs, IgG/M/A    Standing Status:   Future    Number of Occurrences:   1    Standing Expiration Date:   03/25/2020  . Homocysteine, serum    Standing Status:   Future    Number of Occurrences:   1    Standing Expiration Date:   03/25/2020  . Factor 5 leiden    Standing Status:   Future    Number of Occurrences:   1    Standing Expiration Date:   03/25/2020  . Prothrombin gene mutation    Standing Status:   Future    Number of Occurrences:   1    Standing Expiration Date:   03/25/2020  . Cardiolipin antibodies, IgG, IgM, IgA    Standing Status:   Future    Number of Occurrences:   1    Standing Expiration Date:   03/25/2020    All of the patients  questions were answered with apparent satisfaction. The patient knows to call the clinic with any problems, questions or concerns.  I spent 40 mins counseling the patient face to face. The total time spent in the appointment was 41 mintues and more than 50% was on counseling and direct patient cares.    Sullivan Lone MD [place] AAHIVMS [facility] Hematology/Oncology Physician [facility]  (Office):       [phone] (Work cell):  [phone] (Fax):           [phone]  03/26/2019 1:35 AM  I, Yevette Edwards, am acting as a scribe for Dr. Sullivan Lone.   .I have reviewed the above documentation for accuracy and completeness, and I agree with the above. Brunetta Genera MD     ADDENDUM  . CBC Latest Ref Rng & Units 03/26/2019 04/28/2015 05/28/2014  WBC 4.0 - 10.5 K/uL 5.8 4.7 11.2(H)  Hemoglobin 13.0 - 17.0 g/dL 21.3(HH) 14.2 15.8  Hematocrit 39.0 - 52.0 % 64.3(H) 40.6(A) 47.6  Platelets 150 - 400 K/uL 194 - 210    . 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

## 2019-03-26 NOTE — Patient Instructions (Signed)
Thank you for choosing [facility] to provide your oncology and hematology care.   Should you have questions after your visit to the [facility] ([facility]), please contact this office at [phone] between 8:30 AM and 4:30 PM.  Voice mails left after 4:00 PM may not be returned until the following business day.  Calls received after 4:30 PM will be answered by an off-site Nurse Triage Line.    Prescription Refills:  Please have your pharmacy contact us directly for most prescription requests.  Contact the office directly for refills of narcotics (pain medications). Allow 48-72 hours for refills.  Appointments: Please contact the [facility] scheduling department [phone] for questions regarding [facility] appointment scheduling.  Contact the schedulers with any scheduling changes so that your appointment can be rescheduled in a timely manner.   Central Scheduling for [place] [phone] - Call to schedule procedures such as PET scans, CT scans, MRI, Ultrasound, etc.  To afford each patient quality time with our providers, please arrive 30 minutes before your scheduled appointment time.  If you arrive late for your appointment, you may be asked to reschedule.  We strive to give you quality time with our providers, and arriving late affects you and other patients whose appointments are after yours. If you are a no show for multiple scheduled visits, you may be dismissed from the clinic at the providers discretion.     Resources: [facility] Social Workers [phone] for additional information on assistance programs --Anne Cunningham/Abigail Elmore  [place] DSS  [phone]: Information regarding food stamps, Medicaid, and utility assistance [employer] [phone]   [place] [employer]'s shared-ride transportation service for eligible riders who have a disability that prevents them from riding the fixed route bus.   Medicare Rights Center 800-333-4114 Helps people with  Medicare understand their rights and benefits, navigate the Medicare system, and secure the quality healthcare they deserve [employer] 800-227-2345 Assists patients locate various types of support and financial assistance Cancer Care: 1-800-813-HOPE (4673) Provides financial assistance, online support groups, medication/co-pay assistance.   Transportation Assistance for appointments at [facility]: Transportation Coordinator [phone]  Again, thank you for choosing  Cancer Center for your care.       

## 2019-03-27 LAB — LUPUS ANTICOAGULANT PANEL
DRVVT: 32.8 s (ref 0.0–47.0)
PTT Lupus Anticoagulant: 33.9 s (ref 0.0–51.9)

## 2019-03-27 LAB — CARDIOLIPIN ANTIBODIES, IGG, IGM, IGA
Anticardiolipin IgA: <9 APL U/mL (ref 0–11)
Anticardiolipin IgG: <9 GPL U/mL (ref 0–14)
Anticardiolipin IgM: 15 MPL U/mL — ABNORMAL HIGH (ref 0–12)

## 2019-03-27 LAB — PROTEIN S ACTIVITY: Protein S Activity: 104 % (ref 63–140)

## 2019-03-27 LAB — PROTEIN S, TOTAL: Protein S Ag, Total: 71 % (ref 60–150)

## 2019-03-27 LAB — PROTEIN C ACTIVITY: Protein C Activity: 95 % (ref 73–180)

## 2019-03-28 LAB — BETA-2-GLYCOPROTEIN I ABS, IGG/M/A
Beta-2 Glyco I IgG: <9 GPI IgG units (ref 0–20)
Beta-2-Glycoprotein I IgA: <9 GPI IgA units (ref 0–25)
Beta-2-Glycoprotein I IgM: <9 GPI IgM units (ref 0–32)

## 2019-03-29 LAB — PROTEIN C, TOTAL: Protein C, Total: 78 % (ref 60–150)

## 2019-03-30 LAB — HOMOCYSTEINE: Homocysteine: UNDETERMINED umol/L

## 2019-03-31 DIAGNOSIS — D45 Polycythemia vera: Secondary | ICD-10-CM | POA: Insufficient documentation

## 2019-03-31 LAB — FACTOR 5 LEIDEN

## 2019-04-01 ENCOUNTER — Other Ambulatory Visit: Payer: Self-pay

## 2019-04-01 ENCOUNTER — Ambulatory Visit (HOSPITAL_COMMUNITY)
Admission: RE | Admit: 2019-04-01 | Discharge: 2019-04-01 | Disposition: A | Discharge status: Home / Self Care | Payer: Medicare HMO | Source: Ambulatory Visit | Attending: Hematology | Admitting: Hematology

## 2019-04-01 ENCOUNTER — Telehealth: Payer: Self-pay | Admitting: *Deleted

## 2019-04-01 DIAGNOSIS — R109 Unspecified abdominal pain: Secondary | ICD-10-CM | POA: Diagnosis not present

## 2019-04-01 DIAGNOSIS — R599 Enlarged lymph nodes, unspecified: Secondary | ICD-10-CM | POA: Diagnosis not present

## 2019-04-01 LAB — PROTHROMBIN GENE MUTATION

## 2019-04-01 MED ORDER — SODIUM CHLORIDE (PF) 0.9 % IJ SOLN
INTRAMUSCULAR | Status: AC
  Filled 2019-04-01: qty 50

## 2019-04-01 MED ORDER — IOHEXOL 300 MG/ML  SOLN
100.0000 mL | Freq: Once | INTRAMUSCULAR | Status: AC | PRN
  Administered 2019-04-01: 100 mL via INTRAVENOUS

## 2019-04-01 NOTE — Telephone Encounter (Addendum)
Contacted patient regarding test results per Dr. 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 JAK2 testing pending." Contacted patient 0900 - LVM requesting patient contact office upon receipt of message regarding lab results. Patient contacted office - Patient verbalized understanding of information and is scheduled for phlebotomy 12/23 at [place].

## 2019-04-02 ENCOUNTER — Other Ambulatory Visit: Payer: Self-pay

## 2019-04-02 ENCOUNTER — Inpatient Hospital Stay: Payer: Medicare HMO

## 2019-04-02 VITALS — BP 129/73 | HR 75 | Temp 98.0°F | Resp 18

## 2019-04-02 DIAGNOSIS — Z79899 Other long term (current) drug therapy: Secondary | ICD-10-CM | POA: Diagnosis not present

## 2019-04-02 DIAGNOSIS — R69 Illness, unspecified: Secondary | ICD-10-CM | POA: Diagnosis not present

## 2019-04-02 DIAGNOSIS — M549 Dorsalgia, unspecified: Secondary | ICD-10-CM | POA: Diagnosis not present

## 2019-04-02 DIAGNOSIS — Z7901 Long term (current) use of anticoagulants: Secondary | ICD-10-CM | POA: Diagnosis not present

## 2019-04-02 DIAGNOSIS — Z86711 Personal history of pulmonary embolism: Secondary | ICD-10-CM | POA: Diagnosis not present

## 2019-04-02 DIAGNOSIS — D751 Secondary polycythemia: Secondary | ICD-10-CM | POA: Diagnosis not present

## 2019-04-02 DIAGNOSIS — R918 Other nonspecific abnormal finding of lung field: Secondary | ICD-10-CM | POA: Diagnosis not present

## 2019-04-02 DIAGNOSIS — D45 Polycythemia vera: Secondary | ICD-10-CM

## 2019-04-02 DIAGNOSIS — D6859 Other primary thrombophilia: Secondary | ICD-10-CM | POA: Diagnosis not present

## 2019-04-02 DIAGNOSIS — E039 Hypothyroidism, unspecified: Secondary | ICD-10-CM | POA: Diagnosis not present

## 2019-04-02 DIAGNOSIS — R109 Unspecified abdominal pain: Secondary | ICD-10-CM | POA: Diagnosis not present

## 2019-04-02 NOTE — Progress Notes (Signed)
Tyler Randall presents today for phlebotomy per MD orders. Phlebotomy procedure started at 0836 and ended at 0840. 500 grams removed. Phlebotomy kit IV needle inserted into RAC, IV needle removed intact. Patient observed for 30 minutes after procedure without any incident. Patient tolerated procedure well. Food and drinks offered. Verbal and written copy of education provided.

## 2019-04-02 NOTE — Patient Instructions (Signed)

## 2019-04-15 NOTE — Progress Notes (Signed)
HEMATOLOGY/ONCOLOGY CONSULTATION NOTE  Date of Service: 04/16/2019  Patient Care Team: [name], MD as PCP - General (Family Medicine)  CHIEF COMPLAINTS/PURPOSE OF CONSULTATION:  DVT/PE  HISTORY OF PRESENTING ILLNESS:   Tyler Randall is a wonderful 74 y.o. male who has been referred to [place] by Dr Loanne Drilling for evaluation and management of DVT/PE. The pt reports that he is doing well overall.  The pt reports that he has never had a blood clot prior the clot found in 11/2018. He denies any family history of blood clots or other blood disorders. In July pt began to have a sharp pain in his right chest. It reminded him of a similar pain he had when he fell on his chest a year prior. He sustained no serious injuries from this fall and the pain went away on its own. When the pain began this July pt did not feel the need to go to the ED and waited almost a week to see Dr. Carlota Raspberry, his PCP. This pain disappeared by August and he did not have any leg swelling, pain, or cramps during this period at all. He is still not having any symptoms in his leg. He was not given any indication as to the cause of his blood clot. Pt was placed on Xarelto for 3 months and was told that it was okay to discontinue in November. He has not taken Xarelto in about a month.   Pt quit smoking 3 years ago and currently drinking 4-5 drinks per day. He denies any injuries to his left leg or long-distance travel around or before August. He lost about 15 lbs since August. Prior to his weight loss he stopped taking Mirtazapine, which he had been taking for nearly 25 years. He stopped all at once and immediately noticed a reduction in appetite and increased sleeplessness. He now having to take two Benadryl at night to help him sleep. His previous PCP, Dr. Osvaldo Angst, was managing his depression. Pt denies any current symptoms of depression and has not asked Dr. Carlota Raspberry to take over this care. Pt feels that his weight loss has stabilized  at this time. He also began walking 2 miles every morning a few months ago and was previously more sedentary. 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 There was some chemical exposure involved. Pt has not had any recent surgical procedures. Pt reports that his hypothyroidism has been stable. He has been having some back pain but he attributes this to age and certain body positions that holds.   In 2003 pt found 1 area of superficial melanoma on his skin. Dr. Osvaldo Angst found it and sent pt to [facility]. Pt had an Wide Local Excision of the lesion and has had no recurrence since. Pt had his latest skin cancer screening last month. He was also diagnosed with Prostate Cancer later in 2003. It early stage disease that was treated with radiation seeds. Pt was being followed by a Urologist but because of insurance changes caused by retirement Dr. Carlota Raspberry is currently monitoring his prostate. Pt will see Dr. Carlota Raspberry in February. Pt had his last Colonoscopy in 01/2018. They recommended a rpt Colonoscopy 6 months later but pt declined.   Of note prior to the patient's visit today, pt has had VAS [place] Lower Extremity Venous Left ([id number]) completed on 03/03/2019 with results revealing "Right: No evidence of common femoral vein obstruction. 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 No cystic structure found in the popliteal fossa. All other veins visualized appear fully compressible and demonstrate appropriate Doppler characteristics. The common and external iliac veins are widely patent with no evidence of thrombus. Findings appear improved from previous examination."   Pt has had CT Chest ([id number]) completed on 02/25/2019 with results revealing "1. No acute abnormality. 2. Improving opacities in the right middle lobe and right lower lobe. These are favored to represent sequela of pneumonia  versus pulmonary infarcts in the setting of a prior acute pulmonary embolus. Consider a 6 month follow-up CT to confirm complete resolution of these findings. 3. Again noted is a 4.7 cm ascending thoracic aortic aneurysm. Ascending thoracic aortic aneurysm. Recommend semi-annual imaging followup by CTA or MRA and referral to cardiothoracic surgery if not already obtained."  Pt has had VAS [place] Lower Extremity Venous B/L ([id number]) completed on 11/20/2018 with results revealing "Right: There is no evidence of deep vein thrombosis in the lower extremity. No cystic structure found in the popliteal fossa. Left: Findings consistent with age indeterminate deep vein thrombosis involving the iliac, proximal left common femoral vein and sapheno-femoral junction No cystic structure found in the popliteal fossa."  Pt has had CT Angio Chest ([id number]) completed on 11/14/2018  with results revealing "1. There are pulmonary emboli on the right with an incompletely obstructing pulmonary embolus in the proximal right lower lobe pulmonary artery which may be chronic given its contour and overall appearance. A small pulmonary embolus involving a posterior segment right upper lobe pulmonary artery branch also noted. No other pulmonary emboli evident. No pulmonary embolus to the left heart strain of midline evident. 2. Ascending thoracic aorta has a measured diameter of 4.7 x 4.6 cm. No evident dissection. Ascending thoracic aortic aneurysm. Recommend semi-annual imaging followup by CTA or MRA and referral to cardiothoracic surgery if not already obtained. Aortic aneurysm NOS (ICD10-I71.9). There are foci of aortic atherosclerosis. 3. Areas of suspected pneumonia in the medial segment right middle lobe and portions of the peripheral anterior and lateral segments of the right lower lobe. No new airspace consolidation. Stable 5 mm nodular opacity abutting the pleura in the superior segment right lower lobe. No follow-up needed if  patient is low-risk. Non-contrast chest CT can be considered in 12 months if patient is high-risk. 4.  No evident thoracic adenopathy. Aortic aneurysm NOS (ICD10-I71.9).6."  Most recent lab results (04/28/2015) of POCT CBC and CMP is as follows: all values are WNL except for RBC at 4.35, HCT/POC at 40.6, MCH/POC at 32.7, Total Bilirubin at 1.3.  On review of systems, pt reports back pain and denies bowel movement issues, problems urinating, issues swallowing, headaches, changes in vision, testicular pain/swelling, calf pain/swelling/redness, lower left leg pain/swelling/redness and any other symptoms.   On PMHx the pt reports Prostate Cancer, Melanoma, Hypothyroidism, Depression. On Social Hx the pt reports that he quit smoking 3 years ago, currently drinks 4-5 alcoholic beverages per day.  INTERVAL HISTORY:  I connected with  Tyler Randall on 04/16/19 by telephone and verified that I am speaking with the correct person using two identifiers.   I discussed the limitations of evaluation and management by telemedicine. The patient expressed understanding and agreed to proceed.  Other persons participating in the visit and their role in the encounter:     -Yevette Edwards, Medical Scribe  Patient's location: Home Provider's location: [facility] [place] is a wonderful 73 y.o. male who  is here for evaluation and management of DVT/PE. The patient's last visit with [place] was on 03/26/2019. The pt reports that he is doing well overall.  The pt reports that he has felt well in the interim and denies any symptoms. Although pt smoked for many years he denies any concern with lung cancer. Pt acknowledges that he does not often hydrate well.   Of note since the patient's last visit, pt has had CT Abd/Pel ([id number]) completed on 04/01/2019 with results revealing "1. No acute abdominal/pelvic findings, mass lesions or lymphadenopathy. 2. Brachytherapy seeds noted in the prostate gland but no  periprosthetic mass or pelvic adenopathy or evidence of metastatic disease. 3. The major vascular structures appear normal. No findings for venous thrombosis."  Pt has had JAK2, MPL and CALR mutation testing completed on 03/26/2019 with results revealing "No mutations identified".  Lab results (03/26/2019) of CBC w/diff and CMP is as follows: all values are WNL except for RBC at 6.97, Hgb at 21.3, HCT at 64.3, RDW at 17.1, Total Bilirubin at 2.0. 03/26/2019 Antithrombin III at 89 03/26/2019 Protein C Activity at 95 03/26/2019 Protein C Total at 78 03/26/2019 Protein S Activity at 104 03/26/2019 Protein S Total at 71 03/26/2019 PT GENE is "Negative" 03/26/2019 Factor 5 leiden is "Negative" 03/26/2019 Lupus anticoagulant panel is as follows: PTT Lupus Anticoagulatn at 33.9, DRVVT at 32.8 03/26/2019 Cardiolipin antibodies, IgG, IgM, IgA is as follows: Anticardiolipin IgG at <9, Anticardiolipin IgM at 15, Anticardiolipin IgA at <9 03/26/2019 Beta-2-glycoprotein i abs, IgG/M/A is as follows: Beta-2 Glyco I IgGBeta-2 Glyco I IgG at <9, Beta-2-Glycoprotein I IgM at <9, Beta-2-Glycoprotein I IgA at <9  On review of systems, pt denies any other symptoms.   MEDICAL HISTORY:  Past Medical History:  Diagnosis Date  . Allergy   . Depression   . Hypothyroidism   . Prostate cancer ([place])   . Pulmonary embolism ([place])   . Skin cancer    melanoma  . Thyroid disease    hypothyroidism    SURGICAL HISTORY: Past Surgical History:  Procedure Laterality Date  . COLONOSCOPY  2014  . melanoma excision from back     . POLYPECTOMY  2014  . PROSTATE SURGERY    . radiation seed implants    . TONSILLECTOMY      SOCIAL HISTORY: Social History   Socioeconomic History  . Marital status: Divorced    Spouse name: Not on file  . Number of children: Not on file  . Years of education: Not on file  . Highest education level: Not on file  Occupational History  . Not on file  Tobacco Use  . Smoking  status: Former Smoker    Packs/day: 3.50    Years: 50.00    Pack years: 175.00    Start date: 49    Quit date: 02/03/2016    Years since quitting: 3.2  . Smokeless tobacco: Never Used  . Tobacco comment: cut down to 1 pack when quit in 2017  Substance and Sexual Activity  . Alcohol use: Yes    Alcohol/week: 20.0 standard drinks    Types: 20 Cans of beer per week  . Drug use: No  . Sexual activity: Not on file  Other Topics Concern  . Not on file  Social History Narrative  . Not on file   Social Determinants of Health   Financial Resource Strain:   . Difficulty of Paying Living Expenses: Not on file  Food Insecurity:   . Worried About  Running Out of Food in the Last Year: Not on file  . Ran Out of Food in the Last Year: Not on file  Transportation Needs:   . Lack of Transportation (Medical): Not on file  . Lack of Transportation (Non-Medical): Not on file  Physical Activity:   . Days of Exercise per Week: Not on file  . Minutes of Exercise per Session: Not on file  Stress:   . Feeling of Stress : Not on file  Social Connections:   . Frequency of Communication with Friends and Family: Not on file  . Frequency of Social Gatherings with Friends and Family: Not on file  . Attends Religious Services: Not on file  . Active Member of Clubs or Organizations: Not on file  . Attends Archivist Meetings: Not on file  . Marital Status: Not on file  Intimate Partner Violence:   . Fear of Current or Ex-Partner: Not on file  . Emotionally Abused: Not on file  . Physically Abused: Not on file  . Sexually Abused: Not on file    FAMILY HISTORY: Family History  Problem Relation Age of Onset  . Pneumonia Mother   . Liver cancer Father   . Thyroid disease Sister   . Mental retardation Brother   . Lung cancer Brother   . Bone cancer Brother   . Liver cancer Brother   . Colon polyps Neg Hx   . Colon cancer Neg Hx   . Esophageal cancer Neg Hx   . Rectal cancer Neg Hx    . Stomach cancer Neg Hx     ALLERGIES:  is allergic to latex and sulfa antibiotics.  MEDICATIONS:  Current Outpatient Medications  Medication Sig Dispense Refill  . diphenhydrAMINE (BENADRYL) 50 MG tablet Take 50 mg by mouth at bedtime as needed for itching.    . levothyroxine (SYNTHROID) 112 MCG tablet Take 1 tablet (112 mcg total) by mouth daily. 90 tablet 1  . Multiple Vitamins-Minerals (CENTRUM SILVER PO) Take 1 tablet by mouth.    . rivaroxaban (XARELTO) 20 MG TABS tablet Take 1 tablet (20 mg total) by mouth daily with supper. 30 tablet 1  . tadalafil (CIALIS) 10 MG tablet Take 0.5-1 tablets (5-10 mg total) by mouth as needed. 15 tablet 5   No current facility-administered medications for this visit.    REVIEW OF SYSTEMS:   A 10+ POINT REVIEW OF SYSTEMS WAS OBTAINED including neurology, dermatology, psychiatry, cardiac, respiratory, lymph, extremities, GI, GU, Musculoskeletal, constitutional, breasts, reproductive, HEENT.  All pertinent positives are noted in the HPI.  All others are negative.   PHYSICAL EXAMINATION: ECOG PERFORMANCE STATUS: 1 - Symptomatic but completely ambulatory  . There were no vitals filed for this visit. There were no vitals filed for this visit. .There is no height or weight on file to calculate BMI.  Telehealth visit  LABORATORY DATA:  I have reviewed the data as listed  . CBC Latest Ref Rng & Units 03/26/2019 04/28/2015 05/28/2014  WBC 4.0 - 10.5 K/uL 5.8 4.7 11.2(H)  Hemoglobin 13.0 - 17.0 g/dL 21.3(HH) 14.2 15.8  Hematocrit 39.0 - 52.0 % 64.3(H) 40.6(A) 47.6  Platelets 150 - 400 K/uL 194 - 210    . CMP Latest Ref Rng & Units 03/26/2019 11/08/2018 11/12/2017  Glucose 70 - 99 mg/dL 93 100(H) 93  BUN 8 - 23 mg/dL 12 7(L) 11  Creatinine 0.61 - 1.24 mg/dL 0.86 0.71(L) 0.82  Sodium 135 - 145 mmol/L 141 135 143  Potassium 3.5 -  5.1 mmol/L 4.5 5.0 4.7  Chloride 98 - 111 mmol/L 99 90(L) 100  CO2 22 - 32 mmol/L 28 24 24   Calcium 8.9 - 10.3 mg/dL  9.6 9.7 9.9  Total Protein 6.5 - 8.1 g/dL 8.0 7.2 7.2  Total Bilirubin 0.3 - 1.2 mg/dL 2.0(H) 1.0 1.7(H)  Alkaline Phos 38 - 126 U/L 74 88 76  AST 15 - 41 U/L 29 24 26   ALT 0 - 44 U/L 17 18 15    03/26/2019 JAK2 mutation testing:    RADIOGRAPHIC STUDIES: I have personally reviewed the radiological images as listed and agreed with the findings in the report. CT Abdomen Pelvis W Contrast  Result Date: 04/02/2019 CLINICAL DATA:  Chronic DVT. EXAM: CT ABDOMEN AND PELVIS WITH CONTRAST TECHNIQUE: Multidetector CT imaging of the abdomen and pelvis was performed using the standard protocol following bolus administration of intravenous contrast. CONTRAST:  156mL OMNIPAQUE IOHEXOL 300 MG/ML  SOLN COMPARISON:  01/19/2006 FINDINGS: Lower chest: The lung bases are clear of an acute process. There are right middle lobe and right lower lobe scarring type changes. No pleural effusions or worrisome pulmonary lesions. The heart is normal in size. No pericardial effusion. Hepatobiliary: No focal hepatic lesions or intrahepatic biliary dilatation. The gallbladder is normal. No common bile duct dilatation. Pancreas: No mass, inflammation or ductal dilatation. Spleen: Normal size.  No focal lesions. Adrenals/Urinary Tract: The adrenal glands and kidneys are unremarkable. No renal, ureteral or bladder calculi or mass. Stomach/Bowel: The stomach, duodenum, small bowel and colon are unremarkable. No acute inflammatory changes, mass lesions or obstructive findings. The terminal ileum is normal. The appendix is normal. Vascular/Lymphatic: The aorta is normal in caliber. No dissection. The branch vessels are patent. The major venous structures are patent. No mesenteric or retroperitoneal mass or adenopathy. Small scattered lymph nodes are noted. Reproductive: Brachytherapy seeds are noted throughout the prostate gland. The seminal vesicles appear normal. Other: No pelvic mass or adenopathy. No free pelvic fluid collections. No  inguinal mass or adenopathy. No abdominal wall hernia or subcutaneous lesions. Musculoskeletal: No significant bony findings. No worrisome bone lesions to suggest metastatic disease. IMPRESSION: 1. No acute abdominal/pelvic findings, mass lesions or lymphadenopathy. 2. Brachytherapy seeds noted in the prostate gland but no periprosthetic mass or pelvic adenopathy or evidence of metastatic disease. 3. The major vascular structures appear normal. No findings for venous thrombosis. Electronically Signed   By: Marijo Sanes M.D.   On: 04/02/2019 06:05    ASSESSMENT & PLAN:   74 yo with    1) Unprovoked Extensive Left lower extremity DVT extending down from iliac vein - age indeterminate. [place] 11/2018 Persistent chronic DVT noted on rpt [place] 03/03/2019 in peroneal.  11/20/2018 VAS [place] Lower Extremity Venous B/L ([id number]) which revealed "Left: Findings consistent with age indeterminate deep vein thrombosis involving the iliac, proximal left common femoral vein and sapheno-femoral junction No cystic structure found in the popliteal fossa."  03/03/2019 VAS [place] Lower Extremity Venous Left ([id number]) which revealed "Findings consistent with chronic deep vein thrombosis involving one of the left peroneal veins. Findings appear improved from previous examination."   2) Unprovoked pulmonary emboli 11/2018  11/14/2018 CT Angio Chest ([id number]) which revealed "1. There are pulmonary emboli on the right with an incompletely obstructing pulmonary embolus in the proximal right lower lobe pulmonary artery which may be chronic given its contour and overall appearance. A small pulmonary embolus involving a posterior segment right upper lobe pulmonary artery branch also noted. No other pulmonary emboli evident. No  pulmonary embolus to the left heart strain of midline evident. 2. Ascending thoracic aorta has a measured diameter of 4.7 x 4.6 cm. No evident dissection. Ascending thoracic aortic aneurysm."  02/25/2019 CT  Chest ([id number]) which revealed "2. Improving opacities in the right middle lobe and right lower lobe. These are favored to represent sequela of pneumonia versus pulmonary infarcts in the setting of a prior acute pulmonary embolus. 3. Again noted is a 4.7 cm ascending thoracic aortic aneurysm."  3) Patient noted to have significant polycythemia  Concern for polycythemia vera vs secondary polycythemia due to COPD/dehydration from ETOH - though no evidence of dehydration on labs   PLAN: -Discussed pt labwork, 03/26/2019; all values are WNL except for RBC at 6.97, Hgb at 21.3, HCT at 64.3, RDW at 17.1, Total Bilirubin at 2.0. -Discussed 03/26/2019 Antithrombin III at 89 -Discussed 03/26/2019 Protein C Activity at 95 -Discussed 03/26/2019 Protein C Total at 78 -Discussed 03/26/2019 Protein S Activity at 104 -Discussed 03/26/2019 Protein S Total at 71 -Discussed 03/26/2019 PT GENE is "Negative" -Discussed 03/26/2019 Factor 5 leiden is "Negative" -Discussed 03/26/2019 Lupus anticoagulant panel is as follows: PTT Lupus Anticoagulatn at 33.9, DRVVT at 32.8 -Discussed 03/26/2019 Cardiolipin antibodies, IgG, IgM, IgA is as follows: Anticardiolipin IgG at <9, Anticardiolipin IgM at 15, Anticardiolipin IgA at <9 03/26/2019 Beta-2-glycoprotein i abs, IgG/M/A is as follows: Beta-2 Glyco I IgGBeta-2 Glyco I IgG at <9, Beta-2-Glycoprotein I IgM at <9, Beta-2-Glycoprotein I IgA at <9 -Discussed 04/01/2019 CT Abd/Pel ([id number]) which revealed "1. No acute abdominal/pelvic findings, mass lesions or lymphadenopathy. 2. Brachytherapy seeds noted in the prostate gland but no periprosthetic mass or pelvic adenopathy or evidence of metastatic disease. 3. The major vascular structures appear normal. 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 -Advised pt that continued  therapeutic phlebotomies is not recommended treatment for pts without PCV  -Advised pt that his highly elevated Hgb is a risk factor for blood clots -If pt hydrates well and cuts out alcohol and blood counts don't correct, could consider BM Bx for further evaluation -Recommend continued smoking cessation, reducing alcohol intake, and drinking at least 48-64 oz of water each day -Recommended pt stay up to date with age appropriate cancer screenings  -Recommended life long blood thinners due to no provoking event of DVT/PE identified -Continue Xarelto 20 mg per day -Will repeat labs in 1 week -Will plan for a therapeutic phlebotomy in 1 week - only if needed -Will see back in 1 month with labs   FOLLOW UP: Labs and therapeutic phlebotomy in 1 week Labs , therapeutic phlebotomy and MD visit in 4 weeks . Orders Placed This Encounter  Procedures  . CBC with Differential/Platelet    Standing Status:   Future    Standing Expiration Date:   05/20/2020  . CMP ([place] only)    Standing Status:   Future    Standing Expiration Date:   04/15/2020  . Erythropoietin    Standing Status:   Future    Standing Expiration Date:   04/15/2020    The total time spent in the appt was 25 minutes and more than 50% was on counseling and direct patient cares.  All of the patient's questions were answered with apparent satisfaction. The patient knows to call the clinic with any problems, questions or concerns.    Sullivan Lone MD [place] AAHIVMS [facility] Hematology/Oncology Physician [facility]  (Office):       [phone] (Work cell):  [phone] (Fax):           [phone]  04/16/2019 10:27 AM  I, Yevette Edwards, am acting as a scribe for Dr. Sullivan Lone.   .I have reviewed the above documentation for accuracy and completeness, and I agree with the above. Brunetta Genera MD

## 2019-04-16 ENCOUNTER — Inpatient Hospital Stay: Payer: Medicare HMO | Attending: Hematology | Admitting: Hematology

## 2019-04-16 DIAGNOSIS — Z808 Family history of malignant neoplasm of other organs or systems: Secondary | ICD-10-CM | POA: Insufficient documentation

## 2019-04-16 DIAGNOSIS — Z8349 Family history of other endocrine, nutritional and metabolic diseases: Secondary | ICD-10-CM | POA: Insufficient documentation

## 2019-04-16 DIAGNOSIS — Z86718 Personal history of other venous thrombosis and embolism: Secondary | ICD-10-CM | POA: Diagnosis not present

## 2019-04-16 DIAGNOSIS — Z8546 Personal history of malignant neoplasm of prostate: Secondary | ICD-10-CM | POA: Insufficient documentation

## 2019-04-16 DIAGNOSIS — F329 Major depressive disorder, single episode, unspecified: Secondary | ICD-10-CM | POA: Diagnosis not present

## 2019-04-16 DIAGNOSIS — Z923 Personal history of irradiation: Secondary | ICD-10-CM | POA: Insufficient documentation

## 2019-04-16 DIAGNOSIS — Z7901 Long term (current) use of anticoagulants: Secondary | ICD-10-CM | POA: Insufficient documentation

## 2019-04-16 DIAGNOSIS — Z801 Family history of malignant neoplasm of trachea, bronchus and lung: Secondary | ICD-10-CM | POA: Diagnosis not present

## 2019-04-16 DIAGNOSIS — Z800 Family history of malignant neoplasm of digestive organs: Secondary | ICD-10-CM | POA: Insufficient documentation

## 2019-04-16 DIAGNOSIS — D6859 Other primary thrombophilia: Secondary | ICD-10-CM | POA: Diagnosis not present

## 2019-04-16 DIAGNOSIS — E039 Hypothyroidism, unspecified: Secondary | ICD-10-CM | POA: Diagnosis not present

## 2019-04-16 DIAGNOSIS — Z87891 Personal history of nicotine dependence: Secondary | ICD-10-CM | POA: Insufficient documentation

## 2019-04-16 DIAGNOSIS — D751 Secondary polycythemia: Secondary | ICD-10-CM | POA: Insufficient documentation

## 2019-04-16 DIAGNOSIS — Z86711 Personal history of pulmonary embolism: Secondary | ICD-10-CM | POA: Diagnosis not present

## 2019-04-16 DIAGNOSIS — D6851 Activated protein C resistance: Secondary | ICD-10-CM | POA: Diagnosis not present

## 2019-04-16 DIAGNOSIS — D45 Polycythemia vera: Secondary | ICD-10-CM | POA: Diagnosis not present

## 2019-04-16 DIAGNOSIS — Z79899 Other long term (current) drug therapy: Secondary | ICD-10-CM | POA: Insufficient documentation

## 2019-04-16 DIAGNOSIS — Z85820 Personal history of malignant melanoma of skin: Secondary | ICD-10-CM | POA: Insufficient documentation

## 2019-04-16 LAB — JAK2 (INCLUDING V617F AND EXON 12), MPL,& CALR-NEXT GEN SEQ

## 2019-04-17 ENCOUNTER — Telehealth: Payer: Self-pay | Admitting: Hematology

## 2019-04-17 NOTE — Telephone Encounter (Signed)
Scheduled appt per 1/6 los.  Left a vm of the appt date and time.

## 2019-04-18 ENCOUNTER — Other Ambulatory Visit: Payer: Self-pay | Admitting: Hematology

## 2019-04-18 DIAGNOSIS — I2694 Multiple subsegmental pulmonary emboli without acute cor pulmonale: Secondary | ICD-10-CM

## 2019-04-23 ENCOUNTER — Inpatient Hospital Stay: Payer: Medicare HMO

## 2019-04-23 ENCOUNTER — Telehealth: Payer: Self-pay | Admitting: Hematology

## 2019-04-23 ENCOUNTER — Other Ambulatory Visit: Payer: Self-pay

## 2019-04-23 DIAGNOSIS — D751 Secondary polycythemia: Secondary | ICD-10-CM | POA: Diagnosis not present

## 2019-04-23 DIAGNOSIS — D45 Polycythemia vera: Secondary | ICD-10-CM

## 2019-04-23 LAB — CMP (CANCER CENTER ONLY)
ALT: 22 U/L (ref 0–44)
AST: 26 U/L (ref 15–41)
Albumin: 3.8 g/dL (ref 3.5–5.0)
Alkaline Phosphatase: 75 U/L (ref 38–126)
Anion gap: 10 (ref 5–15)
BUN: 10 mg/dL (ref 8–23)
CO2: 28 mmol/L (ref 22–32)
Calcium: 9.2 mg/dL (ref 8.9–10.3)
Chloride: 103 mmol/L (ref 98–111)
Creatinine: 0.88 mg/dL (ref 0.61–1.24)
GFR, Est AFR Am: >60 mL/min (ref >60)
GFR, Estimated: >60 mL/min (ref >60)
Glucose, Bld: 113 mg/dL — ABNORMAL HIGH (ref 70–99)
Potassium: 4.5 mmol/L (ref 3.5–5.1)
Sodium: 141 mmol/L (ref 135–145)
Total Bilirubin: 1.2 mg/dL (ref 0.3–1.2)
Total Protein: 7 g/dL (ref 6.5–8.1)

## 2019-04-23 LAB — CBC WITH DIFFERENTIAL/PLATELET
Abs Immature Granulocytes: 0.01 10*3/uL (ref 0.00–0.07)
Basophils Absolute: 0 10*3/uL (ref 0.0–0.1)
Basophils Relative: 0 %
Eosinophils Absolute: 0.1 10*3/uL (ref 0.0–0.5)
Eosinophils Relative: 2 %
HCT: 53.5 % — ABNORMAL HIGH (ref 39.0–52.0)
Hemoglobin: 17.7 g/dL — ABNORMAL HIGH (ref 13.0–17.0)
Immature Granulocytes: 0 %
Lymphocytes Relative: 28 %
Lymphs Abs: 1.5 10*3/uL (ref 0.7–4.0)
MCH: 30.7 pg (ref 26.0–34.0)
MCHC: 33.1 g/dL (ref 30.0–36.0)
MCV: 92.7 fL (ref 80.0–100.0)
Monocytes Absolute: 1 10*3/uL (ref 0.1–1.0)
Monocytes Relative: 18 %
Neutro Abs: 2.7 10*3/uL (ref 1.7–7.7)
Neutrophils Relative %: 52 %
Platelets: 202 10*3/uL (ref 150–400)
RBC: 5.77 MIL/uL (ref 4.22–5.81)
RDW: 14.5 % (ref 11.5–15.5)
WBC: 5.2 10*3/uL (ref 4.0–10.5)
nRBC: 0 % (ref 0.0–0.2)

## 2019-04-23 NOTE — Progress Notes (Signed)
Tyler Randall presents today for phlebotomy per MD orders. Phlebotomy procedure started at 0944 and ended at 0950. 500 grams removed. Phlebotomy kit IV needle inserted to RAC, IV needle removed intact. Patient had slight syncope after needle was removed but recovered quickly. Patient observed for 30 minutes after procedure without any incident. Patient has a follow-up with MD in 2 months with labs and therapeutic phlebotomy. He has been informed that scheduling will call him with dates and time. Verbal and printed copy of education provided, as well as lab results. Patient knows to call with any questions or concerns.

## 2019-04-23 NOTE — Telephone Encounter (Signed)
Scheduled per 1/13 sch msg. Called and spoke with pt, confirmed 3/16 appt

## 2019-04-23 NOTE — Patient Instructions (Signed)
Therapeutic Phlebotomy, Care After This sheet gives you information about how to care for yourself after your procedure. Your health care provider may also give you more specific instructions. If you have problems or questions, contact your health care provider. What can I expect after the procedure? After the procedure, it is common to have:  Light-headedness or dizziness. You may feel faint.  Nausea.  Tiredness (fatigue). Follow these instructions at home: Eating and drinking  Be sure to eat well-balanced meals for the next 24 hours.  Drink enough fluid to keep your urine pale yellow.  Avoid drinking alcohol on the day that you had the procedure. Activity   Return to your normal activities as told by your health care provider. Most people can go back to their normal activities right away.  Avoid activities that take a lot of effort for about 5 hours after the procedure. Athletes should avoid strenuous exercise for at least 12 hours.  Avoid heavy lifting or pulling for about 5 hours after the procedure. Do not lift anything that is heavier than 10 lb (4.5 kg).  Change positions slowly for the remainder of the day. This will help to prevent light-headedness or fainting.  If you feel light-headed, lie down until the feeling goes away. Needle insertion site care   Keep your bandage (dressing) dry. You can remove the bandage after about 5 hours or as told by your health care provider.  If you have bleeding from the needle insertion site, raise (elevate) your arm and press firmly on the site until the bleeding stops.  If you have bruising at the site, apply ice to the area: ? Remove the dressing. ? Put ice in a plastic bag. ? Place a towel between your skin and the bag. ? Leave the ice on for 20 minutes, 2-3 times a day for the first 24 hours.  If the swelling does not go away after 24 hours, apply a warm, moist cloth (warm compress) to the area for 20 minutes, 2-3 times a  day. General instructions  Do not use any products that contain nicotine or tobacco, such as cigarettes and e-cigarettes, for at least 30 minutes after the procedure.  Keep all follow-up visits as told by your health care provider. This is important. You may need to continue having regular therapeutic phlebotomy treatments as directed. Contact a health care provider if you:  Have redness, swelling, or pain at the needle insertion site.  Have fluid or blood coming from the needle insertion site.  Have pus or a bad smell coming from the needle insertion site.  Notice that the needle insertion site feels warm to the touch.  Feel light-headed, dizzy, or nauseous, and the feeling does not go away.  Have new bruising at the needle insertion site.  Feel weaker than normal.  Have a fever or chills. Get help right away if:  You faint.  You have chest pain.  You have trouble breathing.  You have severe nausea or vomiting. Summary  After the procedure, it is common to have some light-headedness, dizziness, nausea, or tiredness (fatigue).  Be sure to eat well-balanced meals for the next 24 hours. Drink enough fluid to keep your urine pale yellow.  Return to your normal activities as told by your health care provider.  Keep all follow-up visits as told by your health care provider. You may need to continue having regular therapeutic phlebotomy treatments as directed. This information is not intended to replace advice given to you   by your health care provider. Make sure you discuss any questions you have with your health care provider. Document Revised: 04/13/2017 Document Reviewed: 04/12/2017 Elsevier Patient Education  2020 Elsevier Inc.  Coronavirus (COVID-19) Are you at risk?  Are you at risk for the Coronavirus (COVID-19)?  To be considered HIGH RISK for Coronavirus (COVID-19), you have to meet the following criteria:  . Traveled to [place], [place], [place], [place] or [place];  or in the [place] to [place], [place], [place], or [place]; and have fever, cough, and shortness of breath within the last 2 weeks of travel OR . Been in close contact with a person diagnosed with COVID-19 within the last 2 weeks and have fever, cough, and shortness of breath . IF YOU DO NOT MEET THESE CRITERIA, YOU ARE CONSIDERED LOW RISK FOR COVID-19.  What to do if you are HIGH RISK for COVID-19?  . If you are having a medical emergency, call 911. . Seek medical care right away. Before you go to a doctor's office, urgent care or emergency department, call ahead and tell them about your recent travel, contact with someone diagnosed with COVID-19, and your symptoms. You should receive instructions from your physician's office regarding next steps of care.  . When you arrive at healthcare provider, tell the healthcare staff immediately you have returned from visiting [place], [place], [place], [place] or [place]; or traveled in the [place] to [place], [place], [place], or [place]; in the last two weeks or you have been in close contact with a person diagnosed with COVID-19 in the last 2 weeks.   . Tell the health care staff about your symptoms: fever, cough and shortness of breath. . After you have been seen by a medical provider, you will be either: o Tested for (COVID-19) and discharged home on quarantine except to seek medical care if symptoms worsen, and asked to  - Stay home and avoid contact with others until you get your results (4-5 days)  - Avoid travel on public transportation if possible (such as bus, train, or airplane) or o Sent to the Emergency Department by EMS for evaluation, COVID-19 testing, and possible admission depending on your condition and test results.  What to do if you are LOW RISK for COVID-19?  Reduce your risk of any infection by using the same precautions used for avoiding the common cold or flu:  . Wash your hands often with soap and  warm water for at least 20 seconds.  If soap and water are not readily available, use an alcohol-based hand sanitizer with at least 60% alcohol.  . If coughing or sneezing, cover your mouth and nose by coughing or sneezing into the elbow areas of your shirt or coat, into a tissue or into your sleeve (not your hands). . Avoid shaking hands with others and consider head nods or verbal greetings only. . Avoid touching your eyes, nose, or mouth with unwashed hands.  . Avoid close contact with people who are sick. . Avoid places or events with large numbers of people in one location, like concerts or sporting events. . Carefully consider travel plans you have or are making. . If you are planning any travel outside or inside the [place], visit the CDC's Travelers' Health webpage for the latest health notices. . If you have some symptoms but not all symptoms, continue to monitor at home and seek medical attention if your symptoms worsen. . If you are having a medical   emergency, call 911.   ADDITIONAL HEALTHCARE OPTIONS FOR PATIENTS  [facility] / e-Visit: [email or web address]         [facility]: [phone]  [facility]: [phone]                   [facility]: [phone]  

## 2019-04-24 LAB — ERYTHROPOIETIN: Erythropoietin: 2.5 m[IU]/mL — ABNORMAL LOW (ref 2.6–18.5)

## 2019-05-05 ENCOUNTER — Other Ambulatory Visit: Payer: Self-pay | Admitting: Family Medicine

## 2019-05-05 DIAGNOSIS — E039 Hypothyroidism, unspecified: Secondary | ICD-10-CM

## 2019-05-05 NOTE — Telephone Encounter (Signed)
Requested Prescriptions  Pending Prescriptions Disp Refills  . levothyroxine (SYNTHROID) 112 MCG tablet [Pharmacy Med Name: LEVOTHYROXINE 112 MCG TABLET] 90 tablet 1    Sig: TAKE 1 TABLET BY MOUTH EVERY DAY     Endocrinology:  Hypothyroid Agents Failed - 05/05/2019  8:30 AM      Failed - TSH needs to be rechecked within 3 months after an abnormal result. Refill until TSH is due.      Failed - TSH in normal range and within 360 days    TSH  Date Value Ref Range Status  11/08/2018 0.441 (L) 0.450 - 4.500 uIU/mL Final         Passed - Valid encounter within last 12 months    Recent Outpatient Visits          5 months ago Medicare annual wellness visit, subsequent   [facility], Ranell Patrick, MD   5 months ago Hypothyroidism, unspecified type   [facility], Ranell Patrick, MD   5 months ago Hypothyroidism, unspecified type   [facility], Ranell Patrick, MD   1 year ago Hypothyroidism, unspecified type   [facility], Ranell Patrick, MD   1 year ago Medicare annual wellness visit, subsequent   [facility], Ranell Patrick, MD      Future Appointments            In 1 week Carlota Raspberry Ranell Patrick, MD [facility] [place], [facility]

## 2019-05-14 ENCOUNTER — Ambulatory Visit: Payer: Medicare HMO | Admitting: Hematology

## 2019-05-14 ENCOUNTER — Other Ambulatory Visit: Payer: Medicare HMO

## 2019-05-16 ENCOUNTER — Other Ambulatory Visit: Payer: Self-pay

## 2019-05-16 ENCOUNTER — Ambulatory Visit (INDEPENDENT_AMBULATORY_CARE_PROVIDER_SITE_OTHER): Payer: Medicare HMO | Admitting: Family Medicine

## 2019-05-16 ENCOUNTER — Encounter: Payer: Self-pay | Admitting: Family Medicine

## 2019-05-16 VITALS — BP 126/86 | HR 83 | Temp 98.1°F | Ht 74.0 in | Wt 132.0 lb

## 2019-05-16 DIAGNOSIS — I2694 Multiple subsegmental pulmonary emboli without acute cor pulmonale: Secondary | ICD-10-CM

## 2019-05-16 DIAGNOSIS — E039 Hypothyroidism, unspecified: Secondary | ICD-10-CM

## 2019-05-16 DIAGNOSIS — D751 Secondary polycythemia: Secondary | ICD-10-CM

## 2019-05-16 DIAGNOSIS — D6859 Other primary thrombophilia: Secondary | ICD-10-CM | POA: Diagnosis not present

## 2019-05-16 DIAGNOSIS — Z86718 Personal history of other venous thrombosis and embolism: Secondary | ICD-10-CM

## 2019-05-16 DIAGNOSIS — C61 Malignant neoplasm of prostate: Secondary | ICD-10-CM

## 2019-05-16 DIAGNOSIS — R42 Dizziness and giddiness: Secondary | ICD-10-CM

## 2019-05-16 MED ORDER — RIVAROXABAN 20 MG PO TABS: ORAL_TABLET | ORAL | 1 refills | Status: DC

## 2019-05-16 NOTE — Patient Instructions (Addendum)
  Increase water intake, less alcohol and caffeine. If that is not improving the lightheadedness/dizziness, then return to discuss further. Return to the clinic or go to the nearest emergency room if any of your symptoms worsen or new symptoms occur.  Follow up with cardiothoracic surgeon for follow up thoracic aneurysm in next few months.   I will check a PSA, but depending on that reading may need to consult with urology.   I do recommend following up with Dr. Loletha Carrow regarding your last colonoscopy and abnormality seen.    If you have lab work done today you will be contacted with your lab results within the next 2 weeks.  If you have not heard from [place] then please contact us. The fastest way to get your results is to register for My Chart.   IF you received an x-ray today, you will receive an invoice from [facility]. Please contact [facility] at [phone] with questions or concerns regarding your invoice.   IF you received labwork today, you will receive an invoice from [facility]. Please contact [facility] at [phone] with questions or concerns regarding your invoice.   Our billing staff will not be able to assist you with questions regarding bills from these companies.  You will be contacted with the lab results as soon as they are available. The fastest way to get your results is to activate your My Chart account. Instructions are located on the last page of this paperwork. If you have not heard from [place] regarding the results in 2 weeks, please contact this office.

## 2019-05-16 NOTE — Progress Notes (Signed)
Subjective:  Patient ID: Tyler Randall, male    DOB: Dec 27, 1945  Age: 74 y.o. MRN: [id number]  CC:  Chief Complaint  Patient presents with  . Follow-up    on hypothroidism. pt states he is still having issues putting on weight.pt dose have a43mile walking rutine that he does every morning and the pt thinks this might have something to do with not being able to put on weight. pt states his xarelto sometimes makes him dizzy if pt gets up to fast.   10 min chart review  HPI Tyler Randall presents for   Hypothyroidism: Lab Results  Component Value Date   TSH 0.441 (L) 11/08/2018  Currently on Synthroid 112 mcg daily.  Walking 2 miles per day.  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  Appointment with Dr. Irene Limbo January 6 reviewed.  Had taken Xarelto for 3 months.  Off Xarelto for about a month at January 6 visit.  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  Elevated hemoglobin thought to be risk for blood clots.  Recommended lifelong blood thinners, continued on Xarelto 20 mg daily.  Option of therapeutic phlebotomy.  1 month follow-up.   Occasional dizziness with raising up quickly in bed only.  - thinks since starting xarelto - lasts few seconds. No recent worsening. No syncope/near syncope. No blood in stools. No dark/tar stools. No hematuria. No new bleeding. Ice tea and few gatorade per day. 3-4 beers per day, cut back from 5-6.   Prostate cancer: Treated in 2003 with brachytherapy and radiation.  Treated by Dr. Risa Grill, then Dr. Jeffie Pollock with [facility]. Wants me to follow  him for follow up prostate cancer. Does  Not want to return to urology at this time.  Lab Results  Component Value Date   PSA1 1.4 11/09/2016   Rectal abnormality on prior colonoscopy - 01/23/18: - Diverticulosis in the left colon. - One 3 mm polyp in the sigmoid colon, removed with a cold snare. Resected and retrieved. - One 12-15 mm mucosal abnormality in the rectum, removed piecemeal using a cold biopsy forceps. Resected and retrieved. - The examination was otherwise normal on direct and retroflexion views.  Reports 6 week follow up recommended, patient did not want to go back that quick - has not followed up since.   Thoracic aortic aneurysm Surgical consult with cardiothoracic surgery in August 2020.  4.7x4.6 cm aortic aneurysm.  Planned for 69-month follow-up CT to evaluate stability. 4.7 on 02/25/19 CT.    History Patient Active Problem List   Diagnosis Date Noted  . 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 Deep vein thrombosis (DVT) of proximal vein of left lower extremity ([place]) 03/02/2019  . Pulmonary embolus ([place]) 03/02/2019  . Prostate cancer ([place]) 03/27/2014  . Melanoma of skin, site unspecified 10/06/2013  . Unspecified hypothyroidism 10/06/2013  . Unspecified vitamin D deficiency 10/06/2013  . Thyroid condition 11/28/2011   Past Medical History:  Diagnosis Date  . Allergy   . Depression   . Hypothyroidism   . Prostate cancer ([place])   . Pulmonary embolism ([place])   .  Skin cancer    melanoma  . Thyroid disease    hypothyroidism   Past Surgical History:  Procedure Laterality Date  . COLONOSCOPY  2014  . melanoma excision from back     . POLYPECTOMY  2014  . PROSTATE SURGERY    . radiation seed implants    . TONSILLECTOMY     Allergies  Allergen Reactions  . Microplegia Msa-Msg [Plegisol] Hives and Swelling    M.S.G. in food.  . Selenium Gluconate [Selenium] Anaphylaxis and Hives  . Latex     With prolonged exposure  . Sulfa Antibiotics     Not sure  reaction, started in infancy   Prior to Admission medications   Medication Sig Start Date End Date Taking? Authorizing Provider  diphenhydrAMINE (BENADRYL) 50 MG tablet Take 50 mg by mouth at bedtime as needed for itching.   Yes [provider]  levothyroxine (SYNTHROID) 112 MCG tablet TAKE 1 TABLET BY MOUTH EVERY DAY 05/05/19  Yes [name], MD  Multiple Vitamins-Minerals (CENTRUM SILVER PO) Take 1 tablet by mouth.   Yes [provider]  tadalafil (CIALIS) 10 MG tablet Take 0.5-1 tablets (5-10 mg total) by mouth as needed. 11/14/18  Yes [name], MD  XARELTO 20 MG TABS tablet TAKE 1 TABLET (20 MG TOTAL) BY MOUTH DAILY WITH SUPPER. 04/18/19  Yes Brunetta Genera, MD   Social History   Socioeconomic History  . Marital status: Divorced    Spouse name: Not on file  . Number of children: Not on file  . Years of education: Not on file  . Highest education level: Not on file  Occupational History  . Not on file  Tobacco Use  . Smoking status: Former Smoker    Packs/day: 3.50    Years: 50.00    Pack years: 175.00    Start date: 47    Quit date: 02/03/2016    Years since quitting: 3.2  . Smokeless tobacco: Never Used  . Tobacco comment: cut down to 1 pack when quit in 2017  Substance and Sexual Activity  . Alcohol use: Yes    Alcohol/week: 20.0 standard drinks    Types: 20 Cans of beer per week  . Drug use: No  . Sexual activity: Not on file  Other Topics Concern  . Not on file  Social History Narrative  . Not on file   Social Determinants of Health   Financial Resource Strain:   . Difficulty of Paying Living Expenses: Not on file  Food Insecurity:   . Worried About Charity fundraiser in the Last Year: Not on file  . Ran Out of Food in the Last Year: Not on file  Transportation Needs:   . Lack of Transportation (Medical): Not on file  . Lack of Transportation (Non-Medical): Not on file  Physical Activity:   . Days of Exercise per Week:  Not on file  . Minutes of Exercise per Session: Not on file  Stress:   . Feeling of Stress : Not on file  Social Connections:   . Frequency of Communication with Friends and Family: Not on file  . Frequency of Social Gatherings with Friends and Family: Not on file  . Attends Religious Services: Not on file  . Active Member of Clubs or Organizations: Not on file  . Attends Archivist Meetings: Not on file  . Marital Status: Not on file  Intimate Partner Violence:   . Fear of Current or Ex-Partner:  Not on file  . Emotionally Abused: Not on file  . Physically Abused: Not on file  . Sexually Abused: Not on file    Review of Systems Per HPI;   Objective:   Vitals:   05/16/19 1000 05/16/19 1007  BP: (!) 152/85 126/86  Pulse: 83   Temp: 98.1 F (36.7 C)   TempSrc: Temporal   SpO2: 96%   Weight: 132 lb (59.9 kg)   Height: 6\' 2"  (1.88 m)      Physical Exam Vitals reviewed.  Constitutional:      Appearance: He is well-developed.     Comments: Thin appearance. Nontoxic.   HENT:     Head: Normocephalic and atraumatic.  Eyes:     Pupils: Pupils are equal, round, and reactive to light.  Neck:     Vascular: No carotid bruit or JVD.     Comments: No mass, ttp.  Cardiovascular:     Rate and Rhythm: Normal rate and regular rhythm.     Heart sounds: Normal heart sounds. No murmur.  Pulmonary:     Effort: Pulmonary effort is normal.     Breath sounds: Normal breath sounds. No rales.  Musculoskeletal:     Cervical back: Neck supple.  Skin:    General: Skin is warm and dry.  Neurological:     Mental Status: He is alert and oriented to person, place, and time.        Assessment & Plan:  Tyler Randall is a 74 y.o. male . Hypothyroidism, unspecified type - Plan: TSH + free T4 Repeat tsh to decide on med changes  Primary hypercoagulable state ([place]) Multiple subsegmental pulmonary emboli without acute cor pulmonale (HCC) - Plan: rivaroxaban (XARELTO) 20 MG TABS  tablet History of DVT (deep vein thrombosis) Polycythemia, secondary  -tolerating xarelto - continue same. Ongoing hematology follow up.   Episode of dizziness - Plan: TSH + free T4  - increase fluid intake, continue to decrease alcohol. rtc precautions  Prostate cancer [facility]) - Plan: PSA  - potential urology follow up, but agreed to check PSA for now.   Follow up as planned with cardiothoracic surgery, and gastroenterology follow up recommended.      Meds ordered this encounter  Medications  . rivaroxaban (XARELTO) 20 MG TABS tablet    Sig: TAKE 1 TABLET (20 MG TOTAL) BY MOUTH DAILY WITH SUPPER.    Dispense:  90 tablet    Refill:  1   Patient Instructions    Increase water intake, less alcohol and caffeine. If that is not improving the lightheadedness/dizziness, then return to discuss further. Return to the clinic or go to the nearest emergency room if any of your symptoms worsen or new symptoms occur.  Follow up with cardiothoracic surgeon for follow up thoracic aneurysm in next few months.   I will check a PSA, but depending on that reading may need to consult with urology.   I do recommend following up with Dr. Loletha Carrow regarding your last colonoscopy and abnormality seen.    If you have lab work done today you will be contacted with your lab results within the next 2 weeks.  If you have not heard from [place] then please contact us. The fastest way to get your results is to register for My Chart.   IF you received an x-ray today, you will receive an invoice from [facility]. Please contact [facility] at [phone] with questions or concerns regarding your invoice.   IF you received  labwork today, you will receive an invoice from [employer]. Please contact [facility] at [phone] with questions or concerns regarding your invoice.   Our billing staff will not be able to assist you with questions regarding bills from these companies.  You will be contacted  with the lab results as soon as they are available. The fastest way to get your results is to activate your My Chart account. Instructions are located on the last page of this paperwork. If you have not heard from [place] regarding the results in 2 weeks, please contact this office.         Signed, Merri Ray, MD [facility]

## 2019-05-17 LAB — PSA: Prostate Specific Ag, Serum: 0.9 ng/mL (ref 0.0–4.0)

## 2019-05-17 LAB — TSH+FREE T4
Free T4: 1.94 ng/dL — ABNORMAL HIGH (ref 0.82–1.77)
TSH: 0.19 u[IU]/mL — ABNORMAL LOW (ref 0.450–4.500)

## 2019-05-21 ENCOUNTER — Telehealth: Payer: Self-pay | Admitting: Family Medicine

## 2019-05-21 DIAGNOSIS — E039 Hypothyroidism, unspecified: Secondary | ICD-10-CM

## 2019-05-21 MED ORDER — LEVOTHYROXINE SODIUM 100 MCG PO TABS: 100.0000 ug | ORAL_TABLET | Freq: Every day | ORAL | 1 refills | Status: DC

## 2019-05-21 NOTE — Telephone Encounter (Signed)
Patient want to know, with his last TSH reading of 0.190 do you want him to increase or decrease his dose on levothyroxine?

## 2019-05-21 NOTE — Telephone Encounter (Signed)
tsh 0.190 on his latest test   What is the name of the medication? levothyroxine (SYNTHROID) 112 MCG tablet [id number]   Have you contacted your pharmacy to request a refill? Yes   Which pharmacy would you like this sent to?   [id number] - [place], [place] [address]   Patient notified that their request is being sent to the clinical staff for review and that they should receive a call once it is complete. If they do not receive a call within 72 hours they can check with their pharmacy or our office.    Pt out of medication

## 2019-05-21 NOTE — Telephone Encounter (Signed)
See lab notes, new dose Synthroid sent and message sent to patient regarding results.

## 2019-06-06 ENCOUNTER — Ambulatory Visit: Payer: Medicare HMO | Attending: Internal Medicine

## 2019-06-06 DIAGNOSIS — Z23 Encounter for immunization: Secondary | ICD-10-CM | POA: Insufficient documentation

## 2019-06-06 NOTE — Progress Notes (Signed)
   Covid-19 Vaccination Clinic  Name:  Tyler Randall    MRN: [id number] DOB: [birth year]  06/06/2019  Tyler Randall was observed post Covid-19 immunization for 15 minutes without incidence. He was provided with Vaccine Information Sheet and instruction to access the V-Safe system.   Tyler Randall was instructed to call 911 with any severe reactions post vaccine: Marland Kitchen Difficulty breathing  . Swelling of your face and throat  . A fast heartbeat  . A bad rash all over your body  . Dizziness and weakness    Immunizations Administered    Name Date Dose VIS Date Route   Pfizer COVID-19 Vaccine 06/06/2019 10:01 AM 0.3 mL 03/21/2019 Intramuscular   Manufacturer: [place]   Lot: [id number]   [place]: [id number]

## 2019-06-10 ENCOUNTER — Encounter: Payer: Self-pay | Admitting: Gastroenterology

## 2019-06-10 ENCOUNTER — Ambulatory Visit (INDEPENDENT_AMBULATORY_CARE_PROVIDER_SITE_OTHER): Payer: Medicare HMO | Admitting: Gastroenterology

## 2019-06-10 ENCOUNTER — Telehealth: Payer: Self-pay | Admitting: *Deleted

## 2019-06-10 VITALS — BP 114/70 | HR 88 | Temp 98.2°F | Ht 74.0 in | Wt 139.0 lb

## 2019-06-10 DIAGNOSIS — Z7902 Long term (current) use of antithrombotics/antiplatelets: Secondary | ICD-10-CM | POA: Diagnosis not present

## 2019-06-10 DIAGNOSIS — D128 Benign neoplasm of rectum: Secondary | ICD-10-CM | POA: Diagnosis not present

## 2019-06-10 NOTE — Addendum Note (Signed)
Addended by: Horris Latino on: 06/10/2019 03:22 PM   Modules accepted: Orders

## 2019-06-10 NOTE — Telephone Encounter (Signed)
 [facility] Pre-operative Risk Assessment     Request for surgical clearance:     Endoscopy Procedure  What type of surgery is being performed?     Sigmoidoscopy  When is this surgery scheduled?     Tuesday 07/22/19  What type of clearance is required ?   Pharmacy  Are there any medications that need to be held prior to surgery and how long? Xarelto 2 days.  Practice name and name of physician performing surgery?      [facility] / Wilfrid Lund, MD  What is your office phone and fax number?      Phone- [phone]  [phone]  Anesthesia type (None, local, MAC, general) ?       MAC

## 2019-06-10 NOTE — Progress Notes (Signed)
[facility] Progress Note  Chief Complaint: History of adenomatous colon polyps  Subjective  History: Tyler Randall was last seen October 2019 for surveillance colonoscopy.  A diminutive adenomatous sigmoid polyp was removed.  There was also a subtle, flat, spreading, mucosal abnormality in the distal rectum, just above the dentate line.  The area was removed piecemeal with cold biopsy forceps, pathology also returning as adenoma.  Recommendation was for a repeat sigmoidoscopy at a 58-month interval, and a letter was sent to patient May 2020.  He preferred not to have it done at that time due to Covid, and also because he was diagnosed with a pulmonary embolism last summer. Prior to that, he had a fall and landed on the right side of the chest, causing significant pain for least a month afterwards.  That subsided, and then he began having some recurrent right-sided chest pain, ultimately led to a diagnosis of pulmonary embolism.  He was on oral anticoagulation for a few months, saw Dr. Irene Limbo in consultation 2 months ago.  Thrombocytosis, most likely from smoking, was felt to be a contributing factor, additional hypercoagulable testing as below.  Patient underwent 1 session of therapeutic phlebotomy, and says it was a bad experience and he will not do it again.  He denies any chronic abdominal pain, change in bowel habits or rectal bleeding.  We reviewed the previous colonoscopy and pathology findings.  ROS: Cardiovascular:  no chest pain since PE diagnosis and treatment. Respiratory: no dyspnea Seasonal allergy symptoms Remainder of systems negative except as above  The patient's Past Medical, Family and Social History were reviewed and are on file in the EMR. Past Medical History:  Diagnosis Date  . Allergy   . Depression   . Hypothyroidism   . Prostate cancer ([place])   . Pulmonary embolism ([place])   . Skin cancer    melanoma  . Thyroid disease    hypothyroidism   Social history: His  daughter recently moved away, and tells me he does not have anyone who could bring him for an endoscopic procedure.  We therefore gave him information about a care partner ride service for hire. Objective:  Med list reviewed  Current Outpatient Medications:  .  diphenhydrAMINE (BENADRYL) 50 MG tablet, Take 50 mg by mouth at bedtime as needed for itching., Disp: , Rfl:  .  levothyroxine (SYNTHROID) 100 MCG tablet, Take 1 tablet (100 mcg total) by mouth daily., Disp: 90 tablet, Rfl: 1 .  Multiple Vitamins-Minerals (CENTRUM SILVER PO), Take 1 tablet by mouth., Disp: , Rfl:  .  rivaroxaban (XARELTO) 20 MG TABS tablet, TAKE 1 TABLET (20 MG TOTAL) BY MOUTH DAILY WITH SUPPER., Disp: 90 tablet, Rfl: 1 .  tadalafil (CIALIS) 10 MG tablet, Take 0.5-1 tablets (5-10 mg total) by mouth as needed., Disp: 15 tablet, Rfl: 5   Vital signs in last 24 hrs: Vitals:   06/10/19 0911  BP: 114/70  Pulse: 88  Temp: 98.2 F (36.8 C)    Physical Exam  Well-appearing, pleasant and conversational  HEENT: sclera anicteric, oral mucosa moist without lesions  Neck: supple, no thyromegaly, JVD or lymphadenopathy  Cardiac: RRR without murmurs, S1S2 heard, no peripheral edema  Pulm: clear to auscultation bilaterally, normal RR and effort noted  Abdomen: soft, no tenderness, with active bowel sounds. No guarding or palpable hepatosplenomegaly.  Skin; warm and dry, no jaundice or rash  Labs:  CBC    Component Value Date/Time   WBC 5.2 04/23/2019 0840  RBC 5.77 04/23/2019 0840   HGB 17.7 (H) 04/23/2019 0840   HCT 53.5 (H) 04/23/2019 0840   PLT 202 04/23/2019 0840   MCV 92.7 04/23/2019 0840   MCV 93.3 04/28/2015 0930   MCH 30.7 04/23/2019 0840   MCHC 33.1 04/23/2019 0840   RDW 14.5 04/23/2019 0840   LYMPHSABS 1.5 04/23/2019 0840   MONOABS 1.0 04/23/2019 0840   EOSABS 0.1 04/23/2019 0840   BASOSABS 0.0 04/23/2019 0840   ________________________  Epo level low  No JAK2  mutation  ___________________ Radiologic studies:  CT angiograms on file, reports reviewed ____________________________________________ Other:   _____________________________________________ Assessment & Plan  Assessment: Encounter Diagnoses  Name Primary?  . Adenomatous rectal polyp Yes  . Long term (current) use of antithrombotics/antiplatelets    Flat, adenomatous rectal polyp removed October 2019.  Overdue for surveillance sigmoidoscopy to see if any residual polyp tissue requiring removal.  Must be off anticoagulation 2 days prior.  We will message Dr. Irene Limbo about this, but I suspect that will be acceptable, given relatively low risk of thrombosis just 36 to 48 hours off anticoagulation for polypectomy if needed.  Tyler Randall is scheduled for second Covid shot in 3 weeks, so we have scheduled this procedure toward the middle or late of April for expected Covid immunity at that point.  He was agreeable to sigmoidoscopy after discussion of procedure and risks.  The benefits and risks of the planned procedure were described in detail with the patient or (when appropriate) their health care proxy.  Risks were outlined as including, but not limited to, bleeding, infection, perforation, adverse medication reaction leading to cardiac or pulmonary decompensation, pancreatitis (if ERCP).  The limitation of incomplete mucosal visualization was also discussed.  No guarantees or warranties were given.   30 minutes were spent on this encounter (including chart review, history/exam, counseling/coordination of care, and documentation)  Tyler Randall

## 2019-06-10 NOTE — Patient Instructions (Addendum)
If you are age 72 or older, your body mass index should be between 23-30. Your Body mass index is 17.85 kg/m. If this is out of the aforementioned range listed, please consider follow up with your Primary Care Provider.  If you are age 1 or younger, your body mass index should be between 19-25. Your Body mass index is 17.85 kg/m. If this is out of the aformentioned range listed, please consider follow up with your Primary Care Provider.   You have been scheduled for a flexible sigmoidoscopy. Please follow the written instructions given to you at your visit today. If you use inhalers (even only as needed), please bring them with you on the day of your procedure.  You will be contacted by our office prior to your procedure for directions on holding your Xarelto.  If you do not hear from our office 1 week prior to your scheduled procedure, please call [phone] to discuss.   It was a pleasure to see you today!  Dr. Loletha Carrow

## 2019-06-23 ENCOUNTER — Other Ambulatory Visit: Payer: Self-pay | Admitting: *Deleted

## 2019-06-23 DIAGNOSIS — D6859 Other primary thrombophilia: Secondary | ICD-10-CM

## 2019-06-23 DIAGNOSIS — D45 Polycythemia vera: Secondary | ICD-10-CM

## 2019-06-23 NOTE — Progress Notes (Signed)
HEMATOLOGY/ONCOLOGY CLINIC NOTE  Date of Service: 06/24/2019  Patient Care Team: [name], MD as PCP - General (Family Medicine)  CHIEF COMPLAINTS/PURPOSE OF CONSULTATION:  DVT/PE  HISTORY OF PRESENTING ILLNESS:   Tyler Randall is a wonderful 74 y.o. male who has been referred to [place] by Dr Loanne Drilling for evaluation and management of DVT/PE. The pt reports that he is doing well overall.  The pt reports that he has never had a blood clot prior the clot found in 11/2018. He denies any family history of blood clots or other blood disorders. In July pt began to have a sharp pain in his right chest. It reminded him of a similar pain he had when he fell on his chest a year prior. He sustained no serious injuries from this fall and the pain went away on its own. When the pain began this July pt did not feel the need to go to the ED and waited almost a week to see Dr. Carlota Raspberry, his PCP. This pain disappeared by August and he did not have any leg swelling, pain, or cramps during this period at all. He is still not having any symptoms in his leg. He was not given any indication as to the cause of his blood clot. Pt was placed on Xarelto for 3 months and was told that it was okay to discontinue in November. He has not taken Xarelto in about a month.   Pt quit smoking 3 years ago and currently drinking 4-5 drinks per day. He denies any injuries to his left leg or long-distance travel around or before August. He lost about 15 lbs since August. Prior to his weight loss he stopped taking Mirtazapine, which he had been taking for nearly 25 years. He stopped all at once and immediately noticed a reduction in appetite and increased sleeplessness. He now having to take two Benadryl at night to help him sleep. His previous PCP, Dr. Osvaldo Angst, was managing his depression. Pt denies any current symptoms of depression and has not asked Dr. Carlota Raspberry to take over this care. Pt feels that his weight loss has stabilized at  this time. He also began walking 2 miles every morning a few months ago and was previously more sedentary. 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 There was some chemical exposure involved. Pt has not had any recent surgical procedures. Pt reports that his hypothyroidism has been stable. He has been having some back pain but he attributes this to age and certain body positions that holds.   In 2003 pt found 1 area of superficial melanoma on his skin. Dr. Osvaldo Angst found it and sent pt to [facility]. Pt had an Wide Local Excision of the lesion and has had no recurrence since. Pt had his latest skin cancer screening last month. He was also diagnosed with Prostate Cancer later in 2003. It early stage disease that was treated with radiation seeds. Pt was being followed by a Urologist but because of insurance changes caused by retirement Dr. Carlota Raspberry is currently monitoring his prostate. Pt will see Dr. Carlota Raspberry in February. Pt had his last Colonoscopy in 01/2018. They recommended a rpt Colonoscopy 6 months later but pt declined.   Of note prior to the patient's visit today, pt has had VAS [place] Lower Extremity Venous Left ([id number]) completed on 03/03/2019 with results revealing "Right: No evidence of common femoral vein obstruction. 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 No cystic structure found in the popliteal fossa. All other veins visualized appear fully compressible and demonstrate appropriate Doppler characteristics. The common and external iliac veins are widely patent with no evidence of thrombus. Findings appear improved from previous examination."   Pt has had CT Chest ([id number]) completed on 02/25/2019 with results revealing "1. No acute abnormality. 2. Improving opacities in the right middle lobe and right lower lobe. These are favored to represent sequela of pneumonia versus  pulmonary infarcts in the setting of a prior acute pulmonary embolus. Consider a 6 month follow-up CT to confirm complete resolution of these findings. 3. Again noted is a 4.7 cm ascending thoracic aortic aneurysm. Ascending thoracic aortic aneurysm. Recommend semi-annual imaging followup by CTA or MRA and referral to cardiothoracic surgery if not already obtained."  Pt has had VAS [place] Lower Extremity Venous B/L ([id number]) completed on 11/20/2018 with results revealing "Right: There is no evidence of deep vein thrombosis in the lower extremity. No cystic structure found in the popliteal fossa. Left: Findings consistent with age indeterminate deep vein thrombosis involving the iliac, proximal left common femoral vein and sapheno-femoral junction No cystic structure found in the popliteal fossa."  Pt has had CT Angio Chest ([id number]) completed on 11/14/2018  with results revealing "1. There are pulmonary emboli on the right with an incompletely obstructing pulmonary embolus in the proximal right lower lobe pulmonary artery which may be chronic given its contour and overall appearance. A small pulmonary embolus involving a posterior segment right upper lobe pulmonary artery branch also noted. No other pulmonary emboli evident. No pulmonary embolus to the left heart strain of midline evident. 2. Ascending thoracic aorta has a measured diameter of 4.7 x 4.6 cm. No evident dissection. Ascending thoracic aortic aneurysm. Recommend semi-annual imaging followup by CTA or MRA and referral to cardiothoracic surgery if not already obtained. Aortic aneurysm NOS (ICD10-I71.9). There are foci of aortic atherosclerosis. 3. Areas of suspected pneumonia in the medial segment right middle lobe and portions of the peripheral anterior and lateral segments of the right lower lobe. No new airspace consolidation. Stable 5 mm nodular opacity abutting the pleura in the superior segment right lower lobe. No follow-up needed if patient  is low-risk. Non-contrast chest CT can be considered in 12 months if patient is high-risk. 4.  No evident thoracic adenopathy. Aortic aneurysm NOS (ICD10-I71.9).6."  Most recent lab results (04/28/2015) of POCT CBC and CMP is as follows: all values are WNL except for RBC at 4.35, HCT/POC at 40.6, MCH/POC at 32.7, Total Bilirubin at 1.3.  On review of systems, pt reports back pain and denies bowel movement issues, problems urinating, issues swallowing, headaches, changes in vision, testicular pain/swelling, calf pain/swelling/redness, lower left leg pain/swelling/redness and any other symptoms.   On PMHx the pt reports Prostate Cancer, Melanoma, Hypothyroidism, Depression. On Social Hx the pt reports that he quit smoking 3 years ago, currently drinks 4-5 alcoholic beverages per day.  INTERVAL HISTORY:  Tyler Randall is a wonderful 74 y.o. male who is here for evaluation and management of DVT/PE. The patient's last visit with [place] was on 04/16/2019. The pt reports that he is doing well overall.  The pt reports that he was extremely lightheaded after his last phlebotomy and passed out. He is not interested in subsequent phlebotomies. Pt is currently drinking 3-4 beers per day, which is a reduction for him, and has increased his Gatorade intake. Pt is not drinking a lot of  water. He quit smoking four years ago. Pt denies any current concerns with Xarelto or any bleeding events. He has no residual pain in his chest and has no pain in his left leg. He is currently walking 2 miles per day. Pt has received his first dose of the COVID19 vaccine and has the second dose scheduled for 03/22.   Lab results today (06/24/19) of CBC w/diff and CMP is as follows: all values are WNL except for Mono Abs at 1.1K, Total Bilirubin at 1.5.  On review of systems, pt denies gum bleeds, nose bleeds, bloody stools, hematuria, chest pain, left leg pain and any other symptoms.   MEDICAL HISTORY:  Past Medical History:    Diagnosis Date  . Allergy   . Depression   . Hypothyroidism   . Prostate cancer ([place])   . Pulmonary embolism ([place])   . Skin cancer    melanoma  . Thyroid disease    hypothyroidism    SURGICAL HISTORY: Past Surgical History:  Procedure Laterality Date  . COLONOSCOPY  2014  . melanoma excision from back     . POLYPECTOMY  2014  . PROSTATE SURGERY    . radiation seed implants    . TONSILLECTOMY      SOCIAL HISTORY: Social History   Socioeconomic History  . Marital status: Divorced    Spouse name: Not on file  . Number of children: Not on file  . Years of education: Not on file  . Highest education level: Not on file  Occupational History  . Not on file  Tobacco Use  . Smoking status: Former Smoker    Packs/day: 3.50    Years: 50.00    Pack years: 175.00    Start date: 3    Quit date: 02/03/2016    Years since quitting: 3.3  . Smokeless tobacco: Never Used  . Tobacco comment: cut down to 1 pack when quit in 2017  Substance and Sexual Activity  . Alcohol use: Yes    Alcohol/week: 20.0 standard drinks    Types: 20 Cans of beer per week  . Drug use: No  . Sexual activity: Not on file  Other Topics Concern  . Not on file  Social History Narrative  . Not on file   Social Determinants of Health   Financial Resource Strain:   . Difficulty of Paying Living Expenses:   Food Insecurity:   . Worried About Charity fundraiser in the Last Year:   . Arboriculturist in the Last Year:   Transportation Needs:   . Film/video editor (Medical):   Marland Kitchen Lack of Transportation (Non-Medical):   Physical Activity:   . Days of Exercise per Week:   . Minutes of Exercise per Session:   Stress:   . Feeling of Stress :   Social Connections:   . Frequency of Communication with Friends and Family:   . Frequency of Social Gatherings with Friends and Family:   . Attends Religious Services:   . Active Member of Clubs or Organizations:   . Attends Archivist  Meetings:   Marland Kitchen Marital Status:   Intimate Partner Violence:   . Fear of Current or Ex-Partner:   . Emotionally Abused:   Marland Kitchen Physically Abused:   . Sexually Abused:     FAMILY HISTORY: Family History  Problem Relation Age of Onset  . Pneumonia Mother   . Liver cancer Father   . Thyroid disease Sister   . Mental  retardation Brother   . Lung cancer Brother   . Bone cancer Brother   . Liver cancer Brother   . Colon polyps Neg Hx   . Colon cancer Neg Hx   . Esophageal cancer Neg Hx   . Rectal cancer Neg Hx   . Stomach cancer Neg Hx     ALLERGIES:  is allergic to microplegia msa-msg [plegisol]; selenium gluconate [selenium]; latex; and sulfa antibiotics.  MEDICATIONS:  Current Outpatient Medications  Medication Sig Dispense Refill  . diphenhydrAMINE (BENADRYL) 50 MG tablet Take 50 mg by mouth at bedtime as needed for itching.    . levothyroxine (SYNTHROID) 100 MCG tablet Take 1 tablet (100 mcg total) by mouth daily. 90 tablet 1  . Multiple Vitamins-Minerals (CENTRUM SILVER PO) Take 1 tablet by mouth.    . rivaroxaban (XARELTO) 20 MG TABS tablet TAKE 1 TABLET (20 MG TOTAL) BY MOUTH DAILY WITH SUPPER. 90 tablet 1  . tadalafil (CIALIS) 10 MG tablet Take 0.5-1 tablets (5-10 mg total) by mouth as needed. 15 tablet 5   No current facility-administered medications for this visit.    REVIEW OF SYSTEMS:   A 10+ POINT REVIEW OF SYSTEMS WAS OBTAINED including neurology, dermatology, psychiatry, cardiac, respiratory, lymph, extremities, GI, GU, Musculoskeletal, constitutional, breasts, reproductive, HEENT.  All pertinent positives are noted in the HPI.  All others are negative.   PHYSICAL EXAMINATION: ECOG PERFORMANCE STATUS: 1 - Symptomatic but completely ambulatory  . Vitals:   06/24/19 0921  BP: (!) 157/81  Pulse: 69  Resp: 18  Temp: 98.1 F (36.7 C)  SpO2: 100%   Filed Weights   06/24/19 0921  Weight: 139 lb (63 kg)   .Body mass index is 17.85 kg/m.  GENERAL:alert, in  no acute distress and comfortable SKIN: no acute rashes, no significant lesions EYES: conjunctiva are pink and non-injected, sclera anicteric OROPHARYNX: MMM, no exudates, no oropharyngeal erythema or ulceration NECK: supple, no JVD LYMPH:  no palpable lymphadenopathy in the cervical, axillary or inguinal regions LUNGS: clear to auscultation b/l with normal respiratory effort HEART: regular rate & rhythm ABDOMEN:  normoactive bowel sounds , non tender, not distended. No palpable hepatosplenomegaly.  Extremity: no pedal edema PSYCH: alert & oriented x 3 with fluent speech NEURO: no focal motor/sensory deficits  LABORATORY DATA:  I have reviewed the data as listed  . CBC Latest Ref Rng & Units 06/24/2019 04/23/2019 03/26/2019  WBC 4.0 - 10.5 K/uL 6.4 5.2 5.8  Hemoglobin 13.0 - 17.0 g/dL 14.2 17.7(H) 21.3(HH)  Hematocrit 39.0 - 52.0 % 42.3 53.5(H) 64.3(H)  Platelets 150 - 400 K/uL 204 202 194    . CMP Latest Ref Rng & Units 06/24/2019 04/23/2019 03/26/2019  Glucose 70 - 99 mg/dL 86 113(H) 93  BUN 8 - 23 mg/dL 12 10 12   Creatinine 0.61 - 1.24 mg/dL 0.79 0.88 0.86  Sodium 135 - 145 mmol/L 140 141 141  Potassium 3.5 - 5.1 mmol/L 3.7 4.5 4.5  Chloride 98 - 111 mmol/L 103 103 99  CO2 22 - 32 mmol/L 29 28 28   Calcium 8.9 - 10.3 mg/dL 9.1 9.2 9.6  Total Protein 6.5 - 8.1 g/dL 6.9 7.0 8.0  Total Bilirubin 0.3 - 1.2 mg/dL 1.5(H) 1.2 2.0(H)  Alkaline Phos 38 - 126 U/L 76 75 74  AST 15 - 41 U/L 29 26 29   ALT 0 - 44 U/L 29 22 17    03/26/2019 JAK2 mutation testing:    RADIOGRAPHIC STUDIES: I have personally reviewed the radiological images as  listed and agreed with the findings in the report. No results found.  ASSESSMENT & PLAN:   74 yo with    1) Unprovoked Extensive Left lower extremity DVT extending down from iliac vein - age indeterminate. [place] 11/2018 Persistent chronic DVT noted on rpt [place] 03/03/2019 in peroneal.  11/20/2018 VAS [place] Lower Extremity Venous B/L ([id number]) which  revealed "Left: Findings consistent with age indeterminate deep vein thrombosis involving the iliac, proximal left common femoral vein and sapheno-femoral junction No cystic structure found in the popliteal fossa."  03/03/2019 VAS [place] Lower Extremity Venous Left ([id number]) which revealed "Findings consistent with chronic deep vein thrombosis involving one of the left peroneal veins. Findings appear improved from previous examination."   04/01/2019 CT Abd/Pel ([id number]) revealed "No findings for venous thrombosis."  2) Unprovoked pulmonary emboli 11/2018  11/14/2018 CT Angio Chest ([id number]) which revealed "1. There are pulmonary emboli on the right with an incompletely obstructing pulmonary embolus in the proximal right lower lobe pulmonary artery which may be chronic given its contour and overall appearance. A small pulmonary embolus involving a posterior segment right upper lobe pulmonary artery branch also noted. No other pulmonary emboli evident. No pulmonary embolus to the left heart strain of midline evident. 2. Ascending thoracic aorta has a measured diameter of 4.7 x 4.6 cm. No evident dissection. Ascending thoracic aortic aneurysm."  02/25/2019 CT Chest ([id number]) which revealed "2. Improving opacities in the right middle lobe and right lower lobe. These are favored to represent sequela of pneumonia versus pulmonary infarcts in the setting of a prior acute pulmonary embolus. 3. Again noted is a 4.7 cm ascending thoracic aortic aneurysm."  3) Patient noted to have significant polycythemia  Concern for polycythemia vera vs secondary polycythemia due to COPD/dehydration from ETOH - though no evidence of dehydration on labs 03/26/2019 JAK2 shows "No mutations identified"  PLAN: -Discussed pt labwork today, 06/24/19; blood counts and chemistries look good -HCT at 42.3 today, no indication for a therapeutic phlebotomy  -Advised pt that we gave him a phlebotomy due to his HCT previously  being at 64.3, which was a risk factor for blood clots.  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  -If HCT increases significantly again would consider BM Bx  -Recommend pt complete LFT with his PCP -Advised pt again that due to extensive blood clots and no provoking event we would recommend life-long anticoagulation -Advised pt that polycythemia was a risk factor, but was not a clear provoking trigger for his blood clots  -Recommended that the pt continue to eat well, drink at least 48-64 oz of water each day, and walk 20-30 minutes each day.  -Recommend pt f/u as scheduled for the second dose of the COVID19 vaccine -Continue Xarelto 20 mg per day -Recommend pt f/u with PCP for anticoagulation management -Will see back as needed   FOLLOW UP: RTC with PCP for continued followup    The total time spent in the appt was 20 minutes and more than 50% was on counseling and direct patient cares.  All of the patient's questions were answered with apparent satisfaction. The patient knows to call the clinic with any problems, questions or concerns.    Sullivan Lone MD [place] AAHIVMS [facility] Hematology/Oncology Physician [facility]  (Office):       [phone] (Work cell):  [phone] (Fax):           [phone]  06/24/2019 9:47 AM  I, Yevette Edwards, am acting  as a scribe for Dr. Sullivan Lone.   .I have reviewed the above documentation for accuracy and completeness, and I agree with the above. Brunetta Genera MD

## 2019-06-24 ENCOUNTER — Inpatient Hospital Stay: Payer: Medicare HMO

## 2019-06-24 ENCOUNTER — Inpatient Hospital Stay: Payer: Medicare HMO | Attending: Hematology | Admitting: Hematology

## 2019-06-24 ENCOUNTER — Other Ambulatory Visit: Payer: Self-pay

## 2019-06-24 VITALS — BP 157/81 | HR 69 | Temp 98.1°F | Resp 18 | Ht 74.0 in | Wt 139.0 lb

## 2019-06-24 DIAGNOSIS — Z86718 Personal history of other venous thrombosis and embolism: Secondary | ICD-10-CM | POA: Insufficient documentation

## 2019-06-24 DIAGNOSIS — I2694 Multiple subsegmental pulmonary emboli without acute cor pulmonale: Secondary | ICD-10-CM | POA: Diagnosis not present

## 2019-06-24 DIAGNOSIS — D6859 Other primary thrombophilia: Secondary | ICD-10-CM

## 2019-06-24 DIAGNOSIS — Z86711 Personal history of pulmonary embolism: Secondary | ICD-10-CM | POA: Insufficient documentation

## 2019-06-24 DIAGNOSIS — Z8546 Personal history of malignant neoplasm of prostate: Secondary | ICD-10-CM | POA: Insufficient documentation

## 2019-06-24 DIAGNOSIS — Z87891 Personal history of nicotine dependence: Secondary | ICD-10-CM | POA: Diagnosis not present

## 2019-06-24 DIAGNOSIS — Z7901 Long term (current) use of anticoagulants: Secondary | ICD-10-CM | POA: Diagnosis not present

## 2019-06-24 DIAGNOSIS — Z800 Family history of malignant neoplasm of digestive organs: Secondary | ICD-10-CM | POA: Diagnosis not present

## 2019-06-24 DIAGNOSIS — D751 Secondary polycythemia: Secondary | ICD-10-CM | POA: Diagnosis not present

## 2019-06-24 DIAGNOSIS — Z923 Personal history of irradiation: Secondary | ICD-10-CM | POA: Diagnosis not present

## 2019-06-24 DIAGNOSIS — Z85820 Personal history of malignant melanoma of skin: Secondary | ICD-10-CM | POA: Insufficient documentation

## 2019-06-24 DIAGNOSIS — D45 Polycythemia vera: Secondary | ICD-10-CM

## 2019-06-24 DIAGNOSIS — Z801 Family history of malignant neoplasm of trachea, bronchus and lung: Secondary | ICD-10-CM | POA: Insufficient documentation

## 2019-06-24 DIAGNOSIS — Z808 Family history of malignant neoplasm of other organs or systems: Secondary | ICD-10-CM | POA: Diagnosis not present

## 2019-06-24 LAB — CMP (CANCER CENTER ONLY)
ALT: 29 U/L (ref 0–44)
AST: 29 U/L (ref 15–41)
Albumin: 3.9 g/dL (ref 3.5–5.0)
Alkaline Phosphatase: 76 U/L (ref 38–126)
Anion gap: 8 (ref 5–15)
BUN: 12 mg/dL (ref 8–23)
CO2: 29 mmol/L (ref 22–32)
Calcium: 9.1 mg/dL (ref 8.9–10.3)
Chloride: 103 mmol/L (ref 98–111)
Creatinine: 0.79 mg/dL (ref 0.61–1.24)
GFR, Est AFR Am: >60 mL/min (ref >60)
GFR, Estimated: >60 mL/min (ref >60)
Glucose, Bld: 86 mg/dL (ref 70–99)
Potassium: 3.7 mmol/L (ref 3.5–5.1)
Sodium: 140 mmol/L (ref 135–145)
Total Bilirubin: 1.5 mg/dL — ABNORMAL HIGH (ref 0.3–1.2)
Total Protein: 6.9 g/dL (ref 6.5–8.1)

## 2019-06-24 LAB — CBC WITH DIFFERENTIAL (CANCER CENTER ONLY)
Abs Immature Granulocytes: 0.02 10*3/uL (ref 0.00–0.07)
Basophils Absolute: 0 10*3/uL (ref 0.0–0.1)
Basophils Relative: 1 %
Eosinophils Absolute: 0.2 10*3/uL (ref 0.0–0.5)
Eosinophils Relative: 3 %
HCT: 42.3 % (ref 39.0–52.0)
Hemoglobin: 14.2 g/dL (ref 13.0–17.0)
Immature Granulocytes: 0 %
Lymphocytes Relative: 31 %
Lymphs Abs: 2 10*3/uL (ref 0.7–4.0)
MCH: 31 pg (ref 26.0–34.0)
MCHC: 33.6 g/dL (ref 30.0–36.0)
MCV: 92.4 fL (ref 80.0–100.0)
Monocytes Absolute: 1.1 10*3/uL — ABNORMAL HIGH (ref 0.1–1.0)
Monocytes Relative: 17 %
Neutro Abs: 3.1 10*3/uL (ref 1.7–7.7)
Neutrophils Relative %: 48 %
Platelet Count: 204 10*3/uL (ref 150–400)
RBC: 4.58 MIL/uL (ref 4.22–5.81)
RDW: 11.8 % (ref 11.5–15.5)
WBC Count: 6.4 10*3/uL (ref 4.0–10.5)
nRBC: 0 % (ref 0.0–0.2)

## 2019-06-30 ENCOUNTER — Telehealth: Payer: Self-pay

## 2019-06-30 NOTE — Telephone Encounter (Signed)
Dr. Carlota Raspberry  Please review the request to hold the patients Xarelto prior to his procedure.    Request for surgical clearance:     Endoscopy Procedure  What type of surgery is being performed?     Sigmoidoscopy  When is this surgery scheduled?     Tuesday 07/22/19  What type of clearance is required ?   Pharmacy  Are there any medications that need to be held prior to surgery and how long? Xarelto 2 days.  Practice name and name of physician performing surgery?      [facility] / Wilfrid Lund, MD  What is your office phone and fax number?      Phone- [phone]  [phone]  Anesthesia type (None, local, MAC, general) ?       MAC  Thank you in advance,   Vivien Rota, [place]

## 2019-07-01 ENCOUNTER — Ambulatory Visit: Payer: Medicare HMO | Attending: Internal Medicine

## 2019-07-01 DIAGNOSIS — Z23 Encounter for immunization: Secondary | ICD-10-CM

## 2019-07-01 NOTE — Progress Notes (Signed)
   Covid-19 Vaccination Clinic  Name:  Tyler Randall    MRN: [id number] DOB: [birth year]  07/01/2019  [name] was observed post Covid-19 immunization for 15 minutes without incident. He was provided with Vaccine Information Sheet and instruction to access the V-Safe system.   [name] was instructed to call 911 with any severe reactions post vaccine: Marland Kitchen Difficulty breathing  . Swelling of face and throat  . A fast heartbeat  . A bad rash all over body  . Dizziness and weakness   Immunizations Administered    Name Date Dose VIS Date Route   Pfizer COVID-19 Vaccine 07/01/2019 11:58 AM 0.3 mL 03/21/2019 Intramuscular   Manufacturer: [place]   Lot: [id number]   [place]: [id number]

## 2019-07-01 NOTE — Telephone Encounter (Signed)
Due to his history of extensive clots I would like to have hematology advise timing and plan for holding anticoagulation.  Will route message to Dr. Irene Limbo. Thanks.

## 2019-07-08 NOTE — Telephone Encounter (Signed)
Printed and faxed this request to Dr Irene Limbo.  Patient is scheduled for 07-21-2019. Need to know cardiac clearance sooner rather than later.

## 2019-07-10 NOTE — Telephone Encounter (Signed)
Patient has been notified and aware, he states clear understanding.

## 2019-07-10 NOTE — Telephone Encounter (Signed)
-----   Message -----  From: Brunetta Genera, MD  Sent: 07/09/2019  5:07 PM EDT  To: Rolland Bimler, RN, [name], MD, *  Subject: Anticoagulation                  Hello Dr Loletha Carrow and Dr Carlota Raspberry,  It would be okay to hold anticoagulation on Mr Schlageter for 48h prior to sigmoidoscopy and start ASAP post procedure after hemostasis is achieved. 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  Thx  Sullivan Lone

## 2019-07-22 ENCOUNTER — Ambulatory Visit (AMBULATORY_SURGERY_CENTER): Payer: Medicare HMO | Admitting: Gastroenterology

## 2019-07-22 ENCOUNTER — Encounter: Payer: Self-pay | Admitting: Gastroenterology

## 2019-07-22 ENCOUNTER — Other Ambulatory Visit: Payer: Self-pay

## 2019-07-22 VITALS — BP 121/69 | HR 70 | Temp 96.8°F | Resp 15 | Ht 74.0 in | Wt 139.0 lb

## 2019-07-22 DIAGNOSIS — D128 Benign neoplasm of rectum: Secondary | ICD-10-CM

## 2019-07-22 MED ORDER — SODIUM CHLORIDE 0.9 % IV SOLN: 500.0000 mL | Freq: Once | INTRAVENOUS | Status: DC

## 2019-07-22 NOTE — Op Note (Signed)
[name] Patient Name: Tyler Randall Procedure Date: 07/22/2019 11:31 AM MRN: [id number] Endoscopist: [place]. Loletha Carrow , MD Age: 74 Referring MD:  Date of Birth: 09/26/1945 Gender: Male Account #: [id number] Procedure:                Flexible Sigmoidoscopy Indications:              Increased risk colon cancer surveillance: Personal                            history of adenoma (10 mm or greater in size) -                            flat polyp at anal verge 01/2018 - removed                            piecemeal with cold biopsy Medicines:                Monitored Anesthesia Care Procedure:                Pre-Anesthesia Assessment:                           - Prior to the procedure, a History and Physical                            was performed, and patient medications and                            allergies were reviewed. The patient's tolerance of                            previous anesthesia was also reviewed. The risks                            and benefits of the procedure and the sedation                            options and risks were discussed with the patient.                            All questions were answered, and informed consent                            was obtained. Prior Anticoagulants: The patient has                            taken Xarelto (rivaroxaban), last dose was 2 days                            prior to procedure. ASA Grade Assessment: III - A                            patient with severe systemic disease. After  reviewing the risks and benefits, the patient was                            deemed in satisfactory condition to undergo the                            procedure.                           After obtaining informed consent, the scope was                            passed under direct vision. The Colonoscope was                            introduced through the anus and advanced to the the            sigmoid colon. The flexible sigmoidoscopy was                            accomplished without difficulty. The patient                            tolerated the procedure well. The quality of the                            bowel preparation was good. Scope In: Scope Out: Findings:                 The perianal and digital rectal examinations were                            normal.                           A 20 mm polyp was found in the distal rectum                            (islands of polyp tissue interspersed with normal                            mucosa over that area). The polyp was flat. The                            polyp was removed with a piecemeal technique using                            a cold biopsy forceps. Resection and retrieval were                            complete.                           The exam was otherwise without abnormality. Complications:            No immediate complications. Estimated Blood Loss:  Estimated blood loss was minimal. Impression:               - One 20 mm area of polyp tissue in the distal                            rectum, removed piecemeal using a cold biopsy                            forceps. Resected and retrieved.                           - The examination was otherwise normal. Recommendation:           - Patient has a contact number available for                            emergencies. The signs and symptoms of potential                            delayed complications were discussed with the                            patient. Return to normal activities tomorrow.                            Written discharge instructions were provided to the                            patient.                           - Resume previous diet.                           - Resume Xarelto (rivaroxaban) at prior dose                            tomorrow.                           - Await pathology results.                           - Repeat  flexible sigmoidoscopy for surveillance                            based on pathology results. Suleiman Finigan L. Loletha Carrow, MD 07/22/2019 12:13:00 PM This report has been signed electronically.

## 2019-07-22 NOTE — Patient Instructions (Addendum)
Handouts: polyps Resume previous diet Resume Xarelto Await pathology results   YOU HAD AN ENDOSCOPIC PROCEDURE TODAY AT [facility]:   Refer to the procedure report that was given to you for any specific questions about what was found during the examination.  If the procedure report does not answer your questions, please call your gastroenterologist to clarify.  If you requested that your care partner not be given the details of your procedure findings, then the procedure report has been included in a sealed envelope for you to review at your convenience later.  YOU SHOULD EXPECT: Some feelings of bloating in the abdomen. Passage of more gas than usual.  Walking can help get rid of the air that was put into your GI tract during the procedure and reduce the bloating. If you had a lower endoscopy (such as a colonoscopy or flexible sigmoidoscopy) you may notice spotting of blood in your stool or on the toilet paper. If you underwent a bowel prep for your procedure, you may not have a normal bowel movement for a few days.  Please Note:  You might notice some irritation and congestion in your nose or some drainage.  This is from the oxygen used during your procedure.  There is no need for concern and it should clear up in a day or so. SYMPTOMS TO REPORT IMMEDIATELY:   Following lower endoscopy (colonoscopy or flexible sigmoidoscopy):  Excessive amounts of blood in the stool  Significant tenderness or worsening of abdominal pains  Swelling of the abdomen that is new, acute  Fever of 100F or higher    For urgent or emergent issues, a gastroenterologist can be reached at any hour by calling [phone]. Do not use MyChart messaging for urgent concerns.    DIET:  We do recommend a small meal at first, but then you may proceed to your regular diet.  Drink plenty of fluids but you should avoid alcoholic beverages for 24 hours.  ACTIVITY:  You should plan to take it easy for the  rest of today and you should NOT DRIVE or use heavy machinery until tomorrow (because of the sedation medicines used during the test).    FOLLOW UP: Our staff will call the number listed on your records 48-72 hours following your procedure to check on you and address any questions or concerns that you may have regarding the information given to you following your procedure. If we do not reach you, we will leave a message.  We will attempt to reach you two times.  During this call, we will ask if you have developed any symptoms of COVID 19. If you develop any symptoms (ie: fever, flu-like symptoms, shortness of breath, cough etc.) before then, please call [phone].  If you test positive for Covid 19 in the 2 weeks post procedure, please call and report this information to [place].    If any biopsies were taken you will be contacted by phone or by letter within the next 1-3 weeks.  Please call us at [phone] if you have not heard about the biopsies in 3 weeks.    SIGNATURES/CONFIDENTIALITY: You and/or your care partner have signed paperwork which will be entered into your electronic medical record.  These signatures attest to the fact that that the information above on your After Visit Summary has been reviewed and is understood.  Full responsibility of the confidentiality of this discharge information lies with you and/or your care-partner.  Information on polyps given to  you today.  Await pathology results.  Resume Xarelto tomorrow at prior dose.  YOU HAD AN ENDOSCOPIC PROCEDURE TODAY AT [facility]:   Refer to the procedure report that was given to you for any specific questions about what was found during the examination.  If the procedure report does not answer your questions, please call your gastroenterologist to clarify.  If you requested that your care partner not be given the details of your procedure findings, then the procedure report has been included in a sealed  envelope for you to review at your convenience later.  YOU SHOULD EXPECT: Some feelings of bloating in the abdomen. Passage of more gas than usual.  Walking can help get rid of the air that was put into your GI tract during the procedure and reduce the bloating. If you had a lower endoscopy (such as a colonoscopy or flexible sigmoidoscopy) you may notice spotting of blood in your stool or on the toilet paper. If you underwent a bowel prep for your procedure, you may not have a normal bowel movement for a few days.  Please Note:  You might notice some irritation and congestion in your nose or some drainage.  This is from the oxygen used during your procedure.  There is no need for concern and it should clear up in a day or so.  SYMPTOMS TO REPORT IMMEDIATELY:   Following lower endoscopy (colonoscopy or flexible sigmoidoscopy):  Excessive amounts of blood in the stool  Significant tenderness or worsening of abdominal pains  Swelling of the abdomen that is new, acute  Fever of 100F or higher   For urgent or emergent issues, a gastroenterologist can be reached at any hour by calling [phone]. Do not use MyChart messaging for urgent concerns.    DIET:  We do recommend a small meal at first, but then you may proceed to your regular diet.  Drink plenty of fluids but you should avoid alcoholic beverages for 24 hours.  ACTIVITY:  You should plan to take it easy for the rest of today and you should NOT DRIVE or use heavy machinery until tomorrow (because of the sedation medicines used during the test).    FOLLOW UP: Our staff will call the number listed on your records 48-72 hours following your procedure to check on you and address any questions or concerns that you may have regarding the information given to you following your procedure. If we do not reach you, we will leave a message.  We will attempt to reach you two times.  During this call, we will ask if you have developed any symptoms  of COVID 19. If you develop any symptoms (ie: fever, flu-like symptoms, shortness of breath, cough etc.) before then, please call [phone].  If you test positive for Covid 19 in the 2 weeks post procedure, please call and report this information to [place].    If any biopsies were taken you will be contacted by phone or by letter within the next 1-3 weeks.  Please call us at [phone] if you have not heard about the biopsies in 3 weeks.    SIGNATURES/CONFIDENTIALITY: You and/or your care partner have signed paperwork which will be entered into your electronic medical record.  These signatures attest to the fact that that the information above on your After Visit Summary has been reviewed and is understood.  Full responsibility of the confidentiality of this discharge information lies with you and/or your care-partner.

## 2019-07-22 NOTE — Progress Notes (Signed)
Called to room to assist during endoscopic procedure.  Patient ID and intended procedure confirmed with present staff. Received instructions for my participation in the procedure from the performing physician.  

## 2019-07-22 NOTE — Progress Notes (Signed)
Vitals-CW Temp-LC  Pt's states no medical or surgical changes since previsit or office visit.  

## 2019-07-22 NOTE — Progress Notes (Signed)
To PACU, VSS. Report to [email or web address].tb 

## 2019-07-24 ENCOUNTER — Telehealth: Payer: Self-pay | Admitting: *Deleted

## 2019-07-24 NOTE — Telephone Encounter (Signed)
  Follow up Call-  Call back number 07/22/2019 01/23/2018  Post procedure Call Back phone  # [phone]  Permission to leave phone message Yes Yes  Some recent data might be hidden     Patient questions:  Do you have a fever, pain , or abdominal swelling? No. Pain Score  0 *  Have you tolerated food without any problems? Yes.    Have you been able to return to your normal activities? Yes.    Do you have any questions about your discharge instructions: Diet   No. Medications  No. Follow up visit  No.  Do you have questions or concerns about your Care? No.  Actions: * If pain score is 4 or above: No action needed, pain <4.  1. Have you developed a fever since your procedure? no  2.   Have you had an respiratory symptoms (SOB or cough) since your procedure? no  3.   Have you tested positive for COVID 19 since your procedure no  4.   Have you had any family members/close contacts diagnosed with the COVID 19 since your procedure?  no   If yes to any of these questions please route to Joylene John, RN and Erenest Rasher, RN

## 2019-07-25 ENCOUNTER — Encounter: Payer: Self-pay | Admitting: Gastroenterology

## 2019-08-13 ENCOUNTER — Encounter: Payer: Self-pay | Admitting: Family Medicine

## 2019-08-13 ENCOUNTER — Other Ambulatory Visit: Payer: Self-pay

## 2019-08-13 ENCOUNTER — Ambulatory Visit (INDEPENDENT_AMBULATORY_CARE_PROVIDER_SITE_OTHER): Payer: Medicare HMO | Admitting: Family Medicine

## 2019-08-13 VITALS — BP 152/78 | HR 65 | Temp 98.0°F | Ht 74.0 in | Wt 141.0 lb

## 2019-08-13 DIAGNOSIS — D6859 Other primary thrombophilia: Secondary | ICD-10-CM | POA: Diagnosis not present

## 2019-08-13 DIAGNOSIS — E039 Hypothyroidism, unspecified: Secondary | ICD-10-CM | POA: Diagnosis not present

## 2019-08-13 DIAGNOSIS — Z86718 Personal history of other venous thrombosis and embolism: Secondary | ICD-10-CM

## 2019-08-13 DIAGNOSIS — I712 Thoracic aortic aneurysm, without rupture, unspecified: Secondary | ICD-10-CM

## 2019-08-13 DIAGNOSIS — I2694 Multiple subsegmental pulmonary emboli without acute cor pulmonale: Secondary | ICD-10-CM

## 2019-08-13 DIAGNOSIS — F1090 Alcohol use, unspecified, uncomplicated: Secondary | ICD-10-CM

## 2019-08-13 DIAGNOSIS — Z789 Other specified health status: Secondary | ICD-10-CM

## 2019-08-13 DIAGNOSIS — Z7289 Other problems related to lifestyle: Secondary | ICD-10-CM

## 2019-08-13 MED ORDER — RIVAROXABAN 20 MG PO TABS: ORAL_TABLET | ORAL | 1 refills | Status: DC

## 2019-08-13 MED ORDER — LEVOTHYROXINE SODIUM 100 MCG PO TABS: 100.0000 ug | ORAL_TABLET | Freq: Every day | ORAL | 1 refills | Status: DC

## 2019-08-13 NOTE — Progress Notes (Signed)
Subjective:  Patient ID: Tyler Randall, male    DOB: Jun 19, 1945  Age: 74 y.o. MRN: [id number]  CC:  Chief Complaint  Patient presents with  . Follow-up    embolism/thyroid    HPI Tyler Randall presents for   Hypothyroidism: Lab Results  Component Value Date   TSH 0.190 (L) 05/16/2019  Last evaluated in February, low TSH at that time.  Synthroid decreased from 112 mcg to 100 mcg. Taking medication daily.  No new hot or cold intolerance. No new hair or skin changes, heart palpitations or new fatigue. Min weight gain.  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  Treated with Xarelto, followed by hematology, pulmonary.  Hematologist Dr. Irene Limbo.  Continues on Xarelto 20 mg daily.  Hematocrit 42.3 in March, previous phlebotomy due to hematocrit at 64.  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  Consideration of bone marrow biopsy if significant hematocrit increase.  Plan for LFTs with me, anticoagulation follow-up with me.  Follow-up as needed with hematology. Still on xarelto '20mg'$  QD.  No new bleeding. Rare blood in nose, no epistaxis. No tx needed.  Lab Results  Component Value Date   WBC 6.4 06/24/2019   HGB 14.2 06/24/2019   HCT 42.3 06/24/2019   MCV 92.4 06/24/2019   PLT 204 06/24/2019    Thoracic aortic aneurysm: 4.7 cm on February 25, 2019 CT.  87-monthfollow-up recommended - scheduled 08/19/19.   Alcohol use/abuse.  No history of hypertension.  Elevates in office at times.  Alcohol: 3-4 beers per day. [facility] 08/13/2019 05/16/2019 11/27/2018 11/14/2018 11/08/2018  Decreased Interest 0 0 0 0 0  Down, Depressed, Hopeless 0 0 0 0 0  PHQ - 2 Score 0 0 0 0 0    Lab Results  Component Value Date   ALT 29 06/24/2019   AST 29 06/24/2019   ALKPHOS 76 06/24/2019     BILITOT 1.5 (H) 06/24/2019    BP Readings from Last 3 Encounters:  08/13/19 (!) 152/78  07/22/19 121/69  06/24/19 (!) 157/81     [facility]C Score  7          History Patient Active Problem List   Diagnosis Date Noted  . Polycythemia vera (HNorthome 03/31/2019  . Deep vein thrombosis (DVT) of proximal vein of left lower extremity (HNorton[place] 03/02/2019  . Pulmonary embolus (HGraft[place] 03/02/2019  . Prostate cancer (HGr[place] 03/27/2014  . Melanoma of skin, site unspecified 10/06/2013  . Unspecified hypothyroidism 10/06/2013  . Unspecified vitamin D deficiency 10/06/2013  . Thyroid condition 11/28/2011   Past Medical History:  Diagnosis Date  . Allergy   . Depression   . Hypothyroidism   . Prostate cancer (HMon[place]   . Pulmonary embolism (HCla[place]   . Skin cancer    melanoma  . Thyroid disease    hypothyroidism   Past Surgical History:  Procedure Laterality Date  . COLONOSCOPY  2014  . melanoma excision from back     . POLYPECTOMY  2014  . PROSTATE SURGERY    . radiation seed implants    . TONSILLECTOMY     Allergies  Allergen Reactions  . Microplegia Msa-Msg [Plegisol] Hives and Swelling    M.S.G.  in food.  . Selenium Gluconate [Selenium] Anaphylaxis and Hives  . Latex     With prolonged exposure  . Sulfa Antibiotics     Not sure reaction, started in infancy   Prior to Admission medications   Medication Sig Start Date End Date Taking? Authorizing Provider  diphenhydrAMINE (BENADRYL) 50 MG tablet Take 50 mg by mouth at bedtime as needed for itching.   Yes [provider]  levothyroxine (SYNTHROID) 100 MCG tablet Take 1 tablet (100 mcg total) by mouth daily. 05/21/19  Yes [name], MD  Multiple Vitamins-Minerals (CENTRUM SILVER PO) Take 1 tablet by mouth.   Yes [provider]  rivaroxaban (XARELTO) 20 MG TABS tablet TAKE 1 TABLET (20 MG TOTAL) BY MOUTH DAILY WITH SUPPER. 05/16/19  Yes [name], MD   tadalafil (CIALIS) 10 MG tablet Take 0.5-1 tablets (5-10 mg total) by mouth as needed. 11/14/18  Yes [name], MD   Social History   Socioeconomic History  . Marital status: Divorced    Spouse name: Not on file  . Number of children: Not on file  . Years of education: Not on file  . Highest education level: Not on file  Occupational History  . Not on file  Tobacco Use  . Smoking status: Former Smoker    Packs/day: 3.50    Years: 50.00    Pack years: 175.00    Start date: 35    Quit date: 02/03/2016    Years since quitting: 3.5  . Smokeless tobacco: Never Used  . Tobacco comment: cut down to 1 pack when quit in 2017  Substance and Sexual Activity  . Alcohol use: Yes    Alcohol/week: 20.0 standard drinks    Types: 20 Cans of beer per week  . Drug use: No  . Sexual activity: Not on file  Other Topics Concern  . Not on file  Social History Narrative  . Not on file   Social Determinants of Health   Financial Resource Strain:   . Difficulty of Paying Living Expenses:   Food Insecurity:   . Worried About Charity fundraiser in the Last Year:   . Arboriculturist in the Last Year:   Transportation Needs:   . Film/video editor (Medical):   Marland Kitchen Lack of Transportation (Non-Medical):   Physical Activity:   . Days of Exercise per Week:   . Minutes of Exercise per Session:   Stress:   . Feeling of Stress :   Social Connections:   . Frequency of Communication with Friends and Family:   . Frequency of Social Gatherings with Friends and Family:   . Attends Religious Services:   . Active Member of Clubs or Organizations:   . Attends Archivist Meetings:   Marland Kitchen Marital Status:   Intimate Partner Violence:   . Fear of Current or Ex-Partner:   . Emotionally Abused:   Marland Kitchen Physically Abused:   . Sexually Abused:     Review of Systems  Constitutional: Negative for fatigue and unexpected weight change.  Eyes: Negative for visual disturbance.  Respiratory:  Negative for cough, chest tightness and shortness of breath.   Cardiovascular: Negative for chest pain, palpitations and leg swelling.  Gastrointestinal: Negative for abdominal pain and blood in stool.  Neurological: Negative for dizziness, light-headedness and headaches.     Objective:   Vitals:   08/13/19 1006 08/13/19 1007  BP: (!) 159/80 (!) 152/78  Pulse: 65   Temp:  98 F (36.7 C)   TempSrc: Temporal   SpO2: 97%   Weight: 141 lb (64 kg)   Height: '6\' 2"'$  (1.88 m)      Physical Exam Vitals reviewed.  Constitutional:      Appearance: He is well-developed.  HENT:     Head: Normocephalic and atraumatic.  Eyes:     Pupils: Pupils are equal, round, and reactive to light.  Neck:     Vascular: No carotid bruit or JVD.  Cardiovascular:     Rate and Rhythm: Normal rate and regular rhythm.     Heart sounds: Normal heart sounds. No murmur.  Pulmonary:     Effort: Pulmonary effort is normal.     Breath sounds: Normal breath sounds. No rales.  Skin:    General: Skin is warm and dry.  Neurological:     Mental Status: He is alert and oriented to person, place, and time.        Assessment & Plan:  Tyler Randall is a 74 y.o. male . Hypothyroidism, unspecified type - Plan: TSH + free T4, levothyroxine (SYNTHROID) 100 MCG tablet  -  Stable, tolerating current regimen. Medications refilled. Labs pending as above.   Primary hypercoagulable state ([place]) History of DVT (deep vein thrombosis) Multiple subsegmental pulmonary emboli without acute cor pulmonale (HCC) - Plan: rivaroxaban (XARELTO) 20 MG TABS tablet  -Tolerating Xarelto, continue same.  Intermittently may need to monitor CBC to make sure hemoglobin remains stable.  Thoracic aortic aneurysm without rupture ([place])  -Planned repeat imaging soon  Alcohol use  -Recommended decreasing use, that may also affect blood pressure.  Follow-up if difficulty cutting back.  Home monitoring of blood pressure is recommended with RTC  precautions.  Meds ordered this encounter  Medications  . rivaroxaban (XARELTO) 20 MG TABS tablet    Sig: TAKE 1 TABLET (20 MG TOTAL) BY MOUTH DAILY WITH SUPPER.    Dispense:  90 tablet    Refill:  1  . levothyroxine (SYNTHROID) 100 MCG tablet    Sig: Take 1 tablet (100 mcg total) by mouth daily.    Dispense:  90 tablet    Refill:  1    New dose.   Patient Instructions    No med changes for now.  Cut back on alcohol to no more than 1-2 drinks per day if possible.  Check your blood pressure outside of the office and if you do register readings over 140 on the top number or over 90 on the bottom number, follow-up to discuss further.  Recheck in 6 months with repeat lab work at that time including blood counts.  Please let me know if there are questions in the meantime.  Take care.    If you have lab work done today you will be contacted with your lab results within the next 2 weeks.  If you have not heard from [place] then please contact us. The fastest way to get your results is to register for My Chart.   IF you received an x-ray today, you will receive an invoice from [facility]. Please contact [facility] at [phone] with questions or concerns regarding your invoice.   IF you received labwork today, you will receive an invoice from [place]. Please contact [facility] at [phone] with questions or concerns regarding your invoice.   Our billing staff will not be able to assist you with questions regarding bills from these companies.  You will be contacted with the lab results as soon as  they are available. The fastest way to get your results is to activate your My Chart account. Instructions are located on the last page of this paperwork. If you have not heard from [place] regarding the results in 2 weeks, please contact this office.         Signed, Merri Ray, MD [facility]

## 2019-08-13 NOTE — Patient Instructions (Addendum)
  No med changes for now.  Cut back on alcohol to no more than 1-2 drinks per day if possible.  Check your blood pressure outside of the office and if you do register readings over 140 on the top number or over 90 on the bottom number, follow-up to discuss further.  Recheck in 6 months with repeat lab work at that time including blood counts.  Please let me know if there are questions in the meantime.  Take care.    If you have lab work done today you will be contacted with your lab results within the next 2 weeks.  If you have not heard from [place] then please contact us. The fastest way to get your results is to register for My Chart.   IF you received an x-ray today, you will receive an invoice from [facility]. Please contact [facility] at [phone] with questions or concerns regarding your invoice.   IF you received labwork today, you will receive an invoice from [place]. Please contact [facility] at [phone] with questions or concerns regarding your invoice.   Our billing staff will not be able to assist you with questions regarding bills from these companies.  You will be contacted with the lab results as soon as they are available. The fastest way to get your results is to activate your My Chart account. Instructions are located on the last page of this paperwork. If you have not heard from [place] regarding the results in 2 weeks, please contact this office.

## 2019-08-14 LAB — TSH+FREE T4
Free T4: 1.39 ng/dL (ref 0.82–1.77)
TSH: 0.711 u[IU]/mL (ref 0.450–4.500)

## 2019-08-19 ENCOUNTER — Ambulatory Visit (INDEPENDENT_AMBULATORY_CARE_PROVIDER_SITE_OTHER)
Admission: RE | Admit: 2019-08-19 | Discharge: 2019-08-19 | Disposition: A | Discharge status: Home / Self Care | Payer: Medicare HMO | Source: Ambulatory Visit | Attending: Pulmonary Disease | Admitting: Pulmonary Disease

## 2019-08-19 ENCOUNTER — Other Ambulatory Visit: Payer: Self-pay

## 2019-08-19 DIAGNOSIS — I2699 Other pulmonary embolism without acute cor pulmonale: Secondary | ICD-10-CM

## 2019-10-31 ENCOUNTER — Other Ambulatory Visit: Payer: Self-pay | Admitting: Cardiothoracic Surgery

## 2019-10-31 DIAGNOSIS — I712 Thoracic aortic aneurysm, without rupture, unspecified: Secondary | ICD-10-CM

## 2019-12-01 ENCOUNTER — Ambulatory Visit
Admission: RE | Admit: 2019-12-01 | Discharge: 2019-12-01 | Disposition: A | Discharge status: Home / Self Care | Payer: Medicare HMO | Source: Ambulatory Visit | Attending: Cardiothoracic Surgery | Admitting: Cardiothoracic Surgery

## 2019-12-01 ENCOUNTER — Ambulatory Visit (INDEPENDENT_AMBULATORY_CARE_PROVIDER_SITE_OTHER): Payer: Medicare HMO | Admitting: Cardiothoracic Surgery

## 2019-12-01 ENCOUNTER — Other Ambulatory Visit: Payer: Self-pay

## 2019-12-01 VITALS — BP 140/90 | HR 80 | Temp 98.1°F | Resp 20 | Ht 74.0 in | Wt 144.0 lb

## 2019-12-01 DIAGNOSIS — I712 Thoracic aortic aneurysm, without rupture, unspecified: Secondary | ICD-10-CM

## 2019-12-02 ENCOUNTER — Telehealth: Payer: Self-pay | Admitting: Family Medicine

## 2019-12-02 ENCOUNTER — Telehealth: Payer: Self-pay | Admitting: *Deleted

## 2019-12-02 NOTE — Progress Notes (Signed)
      [address]       [place] [address]             [phone]     CARDIOTHORACIC SURGERY OFFICE NOTE  Referring Provider is [name], MD Primary Cardiologist is No primary care provider on file. PCP is [name], MD   HPI:  74 yo man followed in office for 4.7 cm asc aortic aneurysm. Returns today for serial f/u. No complaints of chest pain or SOB. He is uncertain as to his BP at home, but today it is a little elevated.    Current Outpatient Medications  Medication Sig Dispense Refill  . diphenhydrAMINE (BENADRYL) 50 MG tablet Take 50 mg by mouth at bedtime as needed for itching.    . levothyroxine (SYNTHROID) 100 MCG tablet Take 1 tablet (100 mcg total) by mouth daily. 90 tablet 1  . Multiple Vitamins-Minerals (CENTRUM SILVER PO) Take 1 tablet by mouth.    . rivaroxaban (XARELTO) 20 MG TABS tablet TAKE 1 TABLET (20 MG TOTAL) BY MOUTH DAILY WITH SUPPER. 90 tablet 1  . tadalafil (CIALIS) 10 MG tablet Take 0.5-1 tablets (5-10 mg total) by mouth as needed. 15 tablet 5   No current facility-administered medications for this visit.      Physical Exam:   BP 140/90   Pulse 80   Temp 98.1 F (36.7 C) (Skin)   Resp 20   Ht 6\' 2"  (1.88 m)   Wt 65.3 kg   SpO2 96% Comment: RA  BMI 18.49 kg/m   General:  Well-appearing, NAD  Chest:   cta  CV:   rrr  Incisions:  n/a  Abdomen:  sntnd  Extremities:  No edema  Diagnostic Tests:  I have reviewed his available imaging studies and agree with interpretation   Impression:  Stable ascending aortic aneurysm  Plan:  F/u in one year with repeat CT chest Work with primary care to ensure BP is well-controlled Report to local ER for unremitting Chest/back pain  I spent in excess of 26minutes during the conduct of this office consultation and >50% of this time involved direct face-to-face encounter with the patient for counseling and/or coordination of their care.  Level 2                  10 minutes Level 3                 15 minutes Level 4                 25 minutes Level 5                 40 minutes  B. Murvin Natal, MD 12/02/2019 7:43 AM

## 2019-12-02 NOTE — Telephone Encounter (Signed)
Pt was returning [place] call. Doesn't have time to schedule awv today please try him back tomorrow

## 2019-12-02 NOTE — Telephone Encounter (Signed)
Schedule awv  

## 2019-12-16 ENCOUNTER — Telehealth: Payer: Self-pay | Admitting: *Deleted

## 2019-12-16 NOTE — Telephone Encounter (Signed)
Schedule awv  

## 2019-12-18 ENCOUNTER — Ambulatory Visit: Payer: Medicare HMO | Admitting: Family Medicine

## 2020-02-05 ENCOUNTER — Encounter: Payer: Self-pay | Admitting: Family Medicine

## 2020-02-05 ENCOUNTER — Other Ambulatory Visit: Payer: Self-pay

## 2020-02-05 ENCOUNTER — Ambulatory Visit (INDEPENDENT_AMBULATORY_CARE_PROVIDER_SITE_OTHER): Payer: Medicare HMO | Admitting: Family Medicine

## 2020-02-05 VITALS — BP 156/96 | HR 89 | Temp 98.1°F | Ht 74.0 in | Wt 138.0 lb

## 2020-02-05 DIAGNOSIS — N529 Male erectile dysfunction, unspecified: Secondary | ICD-10-CM

## 2020-02-05 DIAGNOSIS — Z7289 Other problems related to lifestyle: Secondary | ICD-10-CM

## 2020-02-05 DIAGNOSIS — I10 Essential (primary) hypertension: Secondary | ICD-10-CM

## 2020-02-05 DIAGNOSIS — Z23 Encounter for immunization: Secondary | ICD-10-CM

## 2020-02-05 DIAGNOSIS — Z789 Other specified health status: Secondary | ICD-10-CM

## 2020-02-05 DIAGNOSIS — I2694 Multiple subsegmental pulmonary emboli without acute cor pulmonale: Secondary | ICD-10-CM | POA: Diagnosis not present

## 2020-02-05 DIAGNOSIS — E039 Hypothyroidism, unspecified: Secondary | ICD-10-CM

## 2020-02-05 DIAGNOSIS — D751 Secondary polycythemia: Secondary | ICD-10-CM

## 2020-02-05 MED ORDER — RIVAROXABAN 20 MG PO TABS: ORAL_TABLET | ORAL | 1 refills | Status: DC

## 2020-02-05 MED ORDER — LEVOTHYROXINE SODIUM 100 MCG PO TABS: 100.0000 ug | ORAL_TABLET | Freq: Every day | ORAL | 1 refills | Status: DC

## 2020-02-05 MED ORDER — AMLODIPINE BESYLATE 2.5 MG PO TABS: 2.5000 mg | ORAL_TABLET | Freq: Every day | ORAL | 1 refills | Status: DC

## 2020-02-05 MED ORDER — TADALAFIL 10 MG PO TABS: 5.0000 mg | ORAL_TABLET | ORAL | 5 refills | Status: DC | PRN

## 2020-02-05 NOTE — Progress Notes (Signed)
Subjective:  Patient ID: Tyler Randall, male    DOB: 1945-11-11  Age: 74 y.o. MRN: [id number]  CC:  Chief Complaint  Patient presents with  . Follow-up    on hypothyroidism. Pt reports no issues with this condition since last OV.  . Medication Refill    Pt would like a refill on Synthroid, Xarelto, and Cialis. PT reports these medications work wll for him with no side effects. PT is requestiong a 90day supply for each and a 1 year worth of refills.    HPI Tyler Randall presents for   Hypothyroidism: Lab Results  Component Value Date   TSH 0.711 08/13/2019   Taking medication daily. Synthroid 122mg qd. Stable on lowed dose last visit.  No new hot or cold intolerance. No new hair or skin changes, heart palpitations or new fatigue. No new weight changes.   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  Treated with Xarelto, followed by hematology, pulmonology, hematologist Dr. 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  continued on Xarelto 20 mg daily No new bleeding.   Lab Results  Component Value Date   ALT 29 06/24/2019   AST 29 06/24/2019   ALKPHOS 76 06/24/2019   BILITOT 1.5 (H) 06/24/2019   Lab Results  Component Value Date   WBC 6.4 06/24/2019   HGB 14.2 06/24/2019   HCT 42.3 06/24/2019   MCV 92.4 06/24/2019   PLT 204 06/24/2019   Alcohol use/HTN 3-4 beers per day when discussed in May.  Recommended to decrease to no more than 1 to 2/day.  Borderline blood pressure previously.  Current meds - none. Still drinking 2-4 drinks per day.  Outside BP 140/83.  Stable ascending aortic aneurysm, vascular appt in August, repeat scan 1 yr.  Walking 2 miles every other day.  BP Readings from Last 3 Encounters:  02/05/20 (!) 156/96  12/01/19 140/90  08/13/19 (!) 152/78   Erectile  dysfunction Treated with Cialis. 5-154mabout 1-2/month.  No new back pain, HA, chest pains, vision or hearing changes.   History Patient Active Problem List   Diagnosis Date Noted  . Polycythemia vera (HCCoconino12/21/2020  . Deep vein thrombosis (DVT) of proximal vein of left lower extremity (HCElrama11/22/2020  . Pulmonary embolus (HCMoore11/22/2020  . Prostate cancer (HCMelvina12/18/2015  . Melanoma of skin, site unspecified 10/06/2013  . Unspecified hypothyroidism 10/06/2013  . Unspecified vitamin D deficiency 10/06/2013  . Thyroid condition 11/28/2011   Past Medical History:  Diagnosis Date  . Allergy   . Depression   . Hypothyroidism   . Prostate cancer (HCWhittingham  . Pulmonary embolism (HCChelan  . Skin cancer    melanoma  . Thyroid disease    hypothyroidism   Past Surgical History:  Procedure Laterality Date  . COLONOSCOPY  2014  . melanoma excision from back     . POLYPECTOMY  2014  . PROSTATE SURGERY    . radiation seed implants    . TONSILLECTOMY     Allergies  Allergen Reactions  . Microplegia Msa-Msg [Plegisol] Hives and Swelling    M.S.G. in food.  . Selenium Gluconate [Selenium] Anaphylaxis and Hives  . Latex     With prolonged exposure  . Sulfa Antibiotics     Not sure reaction, started in infancy   Prior to Admission  medications   Medication Sig Start Date End Date Taking? Authorizing Provider  diphenhydrAMINE (BENADRYL) 50 MG tablet Take 50 mg by mouth at bedtime as needed for itching.   Yes [provider]  levothyroxine (SYNTHROID) 100 MCG tablet Take 1 tablet (100 mcg total) by mouth daily. 08/13/19  Yes [name], MD  Multiple Vitamins-Minerals (CENTRUM SILVER PO) Take 1 tablet by mouth.   Yes [provider]  rivaroxaban (XARELTO) 20 MG TABS tablet TAKE 1 TABLET (20 MG TOTAL) BY MOUTH DAILY WITH SUPPER. 08/13/19  Yes [name], MD  tadalafil (CIALIS) 10 MG tablet Take 0.5-1 tablets (5-10 mg total) by mouth as needed. 11/14/18  Yes  [name], MD   Social History   Socioeconomic History  . Marital status: Divorced    Spouse name: Not on file  . Number of children: Not on file  . Years of education: Not on file  . Highest education level: Not on file  Occupational History  . Not on file  Tobacco Use  . Smoking status: Former Smoker    Packs/day: 3.50    Years: 50.00    Pack years: 175.00    Start date: 56    Quit date: 02/03/2016    Years since quitting: 4.0  . Smokeless tobacco: Never Used  . Tobacco comment: cut down to 1 pack when quit in 2017  Vaping Use  . Vaping Use: Former  Substance and Sexual Activity  . Alcohol use: Yes    Alcohol/week: 20.0 standard drinks    Types: 20 Cans of beer per week  . Drug use: No  . Sexual activity: Not on file  Other Topics Concern  . Not on file  Social History Narrative  . Not on file   Social Determinants of Health   Financial Resource Strain:   . Difficulty of Paying Living Expenses: Not on file  Food Insecurity:   . Worried About Charity fundraiser in the Last Year: Not on file  . Ran Out of Food in the Last Year: Not on file  Transportation Needs:   . Lack of Transportation (Medical): Not on file  . Lack of Transportation (Non-Medical): Not on file  Physical Activity:   . Days of Exercise per Week: Not on file  . Minutes of Exercise per Session: Not on file  Stress:   . Feeling of Stress : Not on file  Social Connections:   . Frequency of Communication with Friends and Family: Not on file  . Frequency of Social Gatherings with Friends and Family: Not on file  . Attends Religious Services: Not on file  . Active Member of Clubs or Organizations: Not on file  . Attends Archivist Meetings: Not on file  . Marital Status: Not on file  Intimate Partner Violence:   . Fear of Current or Ex-Partner: Not on file  . Emotionally Abused: Not on file  . Physically Abused: Not on file  . Sexually Abused: Not on file    Review of  Systems  Constitutional: Negative for fatigue and unexpected weight change.  Eyes: Negative for visual disturbance.  Respiratory: Negative for cough, chest tightness and shortness of breath.   Cardiovascular: Negative for chest pain, palpitations and leg swelling.  Gastrointestinal: Negative for abdominal pain and blood in stool.  Neurological: Negative for dizziness, light-headedness and headaches.     Objective:   Vitals:   02/05/20 1345 02/05/20 1346  BP: (!) 164/101 (!) 156/96  Pulse: 89   Temp: 98.1 F (36.7 C)   TempSrc: Temporal   SpO2: 96%   Weight: 138 lb (62.6 kg)   Height: 6' 2"  (1.88 m)      Physical Exam Vitals reviewed.  Constitutional:      Appearance: He is well-developed.  HENT:     Head: Normocephalic and atraumatic.  Eyes:     Pupils: Pupils are equal, round, and reactive to light.  Neck:     Vascular: No carotid bruit or JVD.  Cardiovascular:     Rate and Rhythm: Normal rate and regular rhythm.     Heart sounds: Normal heart sounds. No murmur heard.   Pulmonary:     Effort: Pulmonary effort is normal.     Breath sounds: Normal breath sounds. No rales.  Skin:    General: Skin is warm and dry.  Neurological:     Mental Status: He is alert and oriented to person, place, and time.        Assessment & Plan:  Tyler Randall is a 74 y.o. male . Hypothyroidism, unspecified type - Plan: levothyroxine (SYNTHROID) 100 MCG tablet, TSH  -  Stable, tolerating current regimen. Medications refilled. Labs pending as above.   Need for vaccination - Plan: Flu Vaccine QUAD High Dose(Fluad), CANCELED: Flu Vaccine QUAD 36+ mos IM  Multiple subsegmental pulmonary emboli without acute cor pulmonale (HCC) - Plan: rivaroxaban (XARELTO) 20 MG TABS tablet  -  Stable, tolerating current regimen, no new bleeding.Medications refilled.   Erectile dysfunction, unspecified erectile dysfunction type - Plan: tadalafil (CIALIS) 10 MG tablet  - cialis Rx given - use lowest  effective dose. Side effects discussed (including but not limited to headache/flushing, blue discoloration of vision, possible vascular steal and risk of cardiac effects if underlying unknown coronary artery disease, and permanent sensorineural hearing loss). Understanding expressed.  Polycythemia, secondary - Plan: CBC, Comprehensive metabolic panel  - check cbc, CMP - consider hematology follow up depending on levels.   Alcohol use - Plan: Comprehensive metabolic panel  - decreased use discussed.   Essential hypertension - Plan: amLODipine (NORVASC) 2.5 MG tablet  - low dose amlodipine with possible side effects discussed. Recheck next 3 months.   Meds ordered this encounter  Medications  . levothyroxine (SYNTHROID) 100 MCG tablet    Sig: Take 1 tablet (100 mcg total) by mouth daily.    Dispense:  90 tablet    Refill:  1  . rivaroxaban (XARELTO) 20 MG TABS tablet    Sig: TAKE 1 TABLET (20 MG TOTAL) BY MOUTH DAILY WITH SUPPER.    Dispense:  90 tablet    Refill:  1  . tadalafil (CIALIS) 10 MG tablet    Sig: Take 0.5-1 tablets (5-10 mg total) by mouth as needed.    Dispense:  15 tablet    Refill:  5    N52.9.  Marland Kitchen amLODipine (NORVASC) 2.5 MG tablet    Sig: Take 1 tablet (2.5 mg total) by mouth daily.    Dispense:  90 tablet    Refill:  1   Patient Instructions    Start amlodipine for now, but if you can cut back on alcohol - may be able to come off med. Recheck in 3 months.   No other med changes for now.   If you have lab work done today you will be contacted with your lab results within the next 2 weeks.  If you have not heard from [place] then please contact us.  The fastest way to get your results is to register for My Chart.   IF you received an x-ray today, you will receive an invoice from [facility]. Please contact [facility]iology at [phone] with questions or concerns regarding your invoice.   IF you received labwork today, you will receive an invoice from  [place]. Please contact [facility] at [phone] with questions or concerns regarding your invoice.   Our billing staff will not be able to assist you with questions regarding bills from these companies.  You will be contacted with the lab results as soon as they are available. The fastest way to get your results is to activate your My Chart account. Instructions are located on the last page of this paperwork. If you have not heard from [place] regarding the results in 2 weeks, please contact this office.         Signed, Merri Ray, MD [facility]

## 2020-02-05 NOTE — Patient Instructions (Addendum)
  Start amlodipine for now, but if you can cut back on alcohol - may be able to come off med. Recheck in 3 months.   No other med changes for now.   If you have lab work done today you will be contacted with your lab results within the next 2 weeks.  If you have not heard from [place] then please contact us. The fastest way to get your results is to register for My Chart.   IF you received an x-ray today, you will receive an invoice from [facility]. Please contact [facility] at [phone] with questions or concerns regarding your invoice.   IF you received labwork today, you will receive an invoice from [facility]. Please contact [facility] at [phone] with questions or concerns regarding your invoice.   Our billing staff will not be able to assist you with questions regarding bills from these companies.  You will be contacted with the lab results as soon as they are available. The fastest way to get your results is to activate your My Chart account. Instructions are located on the last page of this paperwork. If you have not heard from [place] regarding the results in 2 weeks, please contact this office.

## 2020-02-06 LAB — CBC
Hematocrit: 53.8 % — ABNORMAL HIGH (ref 37.5–51.0)
Hemoglobin: 18.3 g/dL — ABNORMAL HIGH (ref 13.0–17.7)
MCH: 31.8 pg (ref 26.6–33.0)
MCHC: 34 g/dL (ref 31.5–35.7)
MCV: 93 fL (ref 79–97)
Platelets: 218 10*3/uL (ref 150–450)
RBC: 5.76 x10E6/uL (ref 4.14–5.80)
RDW: 12.9 % (ref 11.6–15.4)
WBC: 6.2 10*3/uL (ref 3.4–10.8)

## 2020-02-06 LAB — COMPREHENSIVE METABOLIC PANEL
ALT: 14 IU/L (ref 0–44)
AST: 19 IU/L (ref 0–40)
Albumin/Globulin Ratio: 1.6 (ref 1.2–2.2)
Albumin: 4.3 g/dL (ref 3.7–4.7)
Alkaline Phosphatase: 71 IU/L (ref 44–121)
BUN/Creatinine Ratio: 11 (ref 10–24)
BUN: 9 mg/dL (ref 8–27)
Bilirubin Total: 1.9 mg/dL — ABNORMAL HIGH (ref 0.0–1.2)
CO2: 28 mmol/L (ref 20–29)
Calcium: 9.7 mg/dL (ref 8.6–10.2)
Chloride: 97 mmol/L (ref 96–106)
Creatinine, Ser: 0.82 mg/dL (ref 0.76–1.27)
GFR calc Af Amer: 102 mL/min/{1.73_m2} (ref >59)
GFR calc non Af Amer: 88 mL/min/{1.73_m2} (ref >59)
Globulin, Total: 2.7 g/dL (ref 1.5–4.5)
Glucose: 83 mg/dL (ref 65–99)
Potassium: 4.8 mmol/L (ref 3.5–5.2)
Sodium: 137 mmol/L (ref 134–144)
Total Protein: 7 g/dL (ref 6.0–8.5)

## 2020-02-06 LAB — TSH: TSH: 2.15 u[IU]/mL (ref 0.450–4.500)

## 2020-02-13 ENCOUNTER — Ambulatory Visit: Payer: Medicare HMO | Admitting: Family Medicine

## 2020-06-03 ENCOUNTER — Other Ambulatory Visit: Payer: Self-pay

## 2020-06-03 ENCOUNTER — Encounter: Payer: Self-pay | Admitting: Family Medicine

## 2020-06-03 ENCOUNTER — Ambulatory Visit (INDEPENDENT_AMBULATORY_CARE_PROVIDER_SITE_OTHER): Payer: Medicare HMO | Admitting: Family Medicine

## 2020-06-03 VITALS — BP 156/86 | HR 89 | Temp 98.4°F | Ht 74.0 in | Wt 144.0 lb

## 2020-06-03 DIAGNOSIS — I10 Essential (primary) hypertension: Secondary | ICD-10-CM

## 2020-06-03 DIAGNOSIS — E039 Hypothyroidism, unspecified: Secondary | ICD-10-CM

## 2020-06-03 DIAGNOSIS — N529 Male erectile dysfunction, unspecified: Secondary | ICD-10-CM

## 2020-06-03 DIAGNOSIS — D751 Secondary polycythemia: Secondary | ICD-10-CM

## 2020-06-03 DIAGNOSIS — I2694 Multiple subsegmental pulmonary emboli without acute cor pulmonale: Secondary | ICD-10-CM

## 2020-06-03 MED ORDER — AMLODIPINE BESYLATE 5 MG PO TABS: 5.0000 mg | ORAL_TABLET | Freq: Every day | ORAL | 2 refills | Status: DC

## 2020-06-03 MED ORDER — RIVAROXABAN 20 MG PO TABS: ORAL_TABLET | ORAL | 2 refills | Status: DC

## 2020-06-03 MED ORDER — LEVOTHYROXINE SODIUM 100 MCG PO TABS: 100.0000 ug | ORAL_TABLET | Freq: Every day | ORAL | 2 refills | Status: DC

## 2020-06-03 MED ORDER — TADALAFIL 10 MG PO TABS: 5.0000 mg | ORAL_TABLET | ORAL | 5 refills | Status: DC | PRN

## 2020-06-03 NOTE — Patient Instructions (Addendum)
Keep a record of your blood pressures outside of the office and bring them to the next office visit.  If readings remain over 140/90 on higher dose of amlodipine, let me know.  No other med changes today.  Recheck in 6 months.     If you have lab work done today you will be contacted with your lab results within the next 2 weeks.  If you have not heard from [place] then please contact us. The fastest way to get your results is to register for My Chart.   IF you received an x-ray today, you will receive an invoice from [facility]. Please contact [facility] at [phone] with questions or concerns regarding your invoice.   IF you received labwork today, you will receive an invoice from [facility]. Please contact [facility] at [phone] with questions or concerns regarding your invoice.   Our billing staff will not be able to assist you with questions regarding bills from these companies.  You will be contacted with the lab results as soon as they are available. The fastest way to get your results is to activate your My Chart account. Instructions are located on the last page of this paperwork. If you have not heard from [place] regarding the results in 2 weeks, please contact this office.

## 2020-06-03 NOTE — Progress Notes (Signed)
Subjective:  Patient ID: Tyler Randall, male    DOB: 1945/06/19  Age: 75 y.o. MRN: [id number]  CC:  Chief Complaint  Patient presents with  . Follow-up    On hypertension due to medication change last OV. PT reports he doesn't check his BP at home, but hasn't had any physical symptoms that he has noticed. PT does want the provider to know that he may have had an allergic reaction to a new jacket he bought a couple days ago. PT reports after warring the jacket his skin became irritated and his face turned red and eyes puffy around the edges. Pt states he thinks this may be why his BP is elevated today.    HPI IRINEO GAULIN presents for   Primary hypercoagulable state with polycythemia, history of DVT/PE Pulmonary embolus in August 2020 treated with Xarelto, followed by hematology, pulmonology, hematologist Dr. Irene Limbo.  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  Continues on Xarelto 20 mg daily.  Monitor hemoglobin and if significant increase in hematocrit consideration of bone marrow biopsy.  Continue to monitor LFTs. Rare blood in nostril - 3 episodes without epistaxis requiring pressure. No other bleeding..  Lab Results  Component Value Date   ALT 14 02/05/2020   AST 19 02/05/2020   ALKPHOS 71 02/05/2020   BILITOT 1.9 (H) 02/05/2020   Lab Results  Component Value Date   WBC 6.2 02/05/2020   HGB 18.3 (H) 02/05/2020   HCT 53.8 (H) 02/05/2020   MCV 93 02/05/2020   PLT 218 02/05/2020    Hypertension/elevated blood pressure Borderline in August 2021, elevated in October and again today. Does note he had a possible contact/allergic reaction from a new jacket few days ago with redness of the skin, puffiness around his eyes past few days, better now. sherpa lining reaction.  Started on amlodipine 2.5 mg daily last visit. Taking daily.  Home readings- none.  Some difficulty with erections at times. Tried wave tech therapy that helped.  cialis helped some. No vision/hearing changes or CP with  exertion.   BP Readings from Last 3 Encounters:  06/03/20 (!) 156/86  02/05/20 (!) 156/96  12/01/19 140/90   Lab Results  Component Value Date   CREATININE 0.82 02/05/2020    Hypothyroidism: Lab Results  Component Value Date   TSH 2.150 02/05/2020  Taking medication daily.  Synthroid 100 mcg daily. No new hot or cold intolerance. No new hair or skin changes, heart palpitations or new fatigue. No new weight changes.      History Patient Active Problem List   Diagnosis Date Noted  . 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 Deep vein thrombosis (DVT) of proximal vein of left lower extremity ([place]) 03/02/2019  . Pulmonary embolus ([place]) 03/02/2019  . Prostate cancer ([place]) 03/27/2014  . Melanoma of skin, site unspecified 10/06/2013  . Unspecified hypothyroidism 10/06/2013  . Unspecified vitamin D deficiency 10/06/2013  . Thyroid condition 11/28/2011   Past Medical History:  Diagnosis Date  . Allergy   . Depression   . Hypothyroidism   . Prostate cancer ([place])   . Pulmonary embolism ([place])   . Skin cancer    melanoma  . Thyroid disease    hypothyroidism   Past Surgical History:  Procedure Laterality Date  . COLONOSCOPY  2014  . melanoma excision from back     . POLYPECTOMY  2014  . PROSTATE SURGERY    . radiation seed implants    . TONSILLECTOMY     Allergies  Allergen Reactions  . Microplegia Msa-Msg [Plegisol] Hives and Swelling    M.S.G. in food.  . Selenium Gluconate [Selenium] Anaphylaxis and Hives  . Latex     With prolonged exposure  . Sulfa Antibiotics     Not sure reaction, started in infancy   Prior to Admission medications   Medication Sig Start Date End Date Taking? Authorizing Provider  amLODipine (NORVASC) 2.5 MG tablet Take 1 tablet (2.5 mg total) by mouth daily. 02/05/20  Yes [name], MD  diphenhydrAMINE (BENADRYL) 50 MG tablet Take 50 mg by mouth at bedtime as needed for itching.   Yes [provider]  levothyroxine  (SYNTHROID) 100 MCG tablet Take 1 tablet (100 mcg total) by mouth daily. 02/05/20  Yes [name], MD  Multiple Vitamins-Minerals (CENTRUM SILVER PO) Take 1 tablet by mouth.   Yes [provider]  rivaroxaban (XARELTO) 20 MG TABS tablet TAKE 1 TABLET (20 MG TOTAL) BY MOUTH DAILY WITH SUPPER. 02/05/20  Yes [name], MD  tadalafil (CIALIS) 10 MG tablet Take 0.5-1 tablets (5-10 mg total) by mouth as needed. 02/05/20  Yes [name], MD   Social History   Socioeconomic History  . Marital status: Divorced    Spouse name: Not on file  . Number of children: Not on file  . Years of education: Not on file  . Highest education level: Not on file  Occupational History  . Not on file  Tobacco Use  . Smoking status: Former Smoker    Packs/day: 3.50    Years: 50.00    Pack years: 175.00    Start date: 37    Quit date: 02/03/2016    Years since quitting: 4.3  . Smokeless tobacco: Never Used  . Tobacco comment: cut down to 1 pack when quit in 2017  Vaping Use  . Vaping Use: Former  Substance and Sexual Activity  . Alcohol use: Yes    Alcohol/week: 20.0 standard drinks    Types: 20 Cans of beer per week  . Drug use: No  . Sexual activity: Not on file  Other Topics Concern  . Not on file  Social History Narrative  . Not on file   Social Determinants of Health   Financial Resource Strain: Not on file  Food Insecurity: Not on file  Transportation Needs: Not on file  Physical Activity: Not on file  Stress: Not on file  Social Connections: Not on file  Intimate Partner Violence: Not on file    Review of Systems  Constitutional: Negative for fatigue and unexpected weight change.  HENT: Positive for nosebleeds (min as above. ).   Eyes: Negative for visual disturbance.  Respiratory: Negative for cough, chest tightness and shortness of breath.   Cardiovascular: Negative for chest pain, palpitations and leg swelling.  Gastrointestinal: Negative for  abdominal pain and blood in stool.  Neurological: Negative for dizziness, light-headedness and headaches.    Objective:   Vitals:   06/03/20 1437 06/03/20 1442  BP: (!) 172/95 (!) 156/86  Pulse: 89   Temp: 98.4 F (36.9 C)   TempSrc: Temporal   SpO2: 98%   Weight: 144 lb (65.3 kg)   Height: _0  (1.88 m)      Physical Exam Vitals reviewed.  Constitutional:      Appearance: He is well-developed and well-nourished.  HENT:     Head: Normocephalic and atraumatic.  Eyes:     Extraocular Movements: EOM normal.     Pupils: Pupils  are equal, round, and reactive to light.  Neck:     Vascular: No carotid bruit or JVD.     Comments: No thyromegaly, mass. Cardiovascular:     Rate and Rhythm: Normal rate and regular rhythm.     Heart sounds: Normal heart sounds. No murmur heard.   Pulmonary:     Effort: Pulmonary effort is normal.     Breath sounds: Normal breath sounds. No rales.  Musculoskeletal:        General: No edema.  Skin:    General: Skin is warm and dry.  Neurological:     Mental Status: He is alert and oriented to person, place, and time.  Psychiatric:        Mood and Affect: Mood and affect normal.    30 minutes spent during visit, greater than 50% counseling and assimilation of information, chart review, and discussion of plan.    Assessment & Plan:  HAZEN BRUMETT is a 75 y.o. male . Polycythemia, secondary - Plan: CBC  -Check CBC, monitor hemoglobin, if significant elevation will need follow-up with hematology.  Monitor LFTs.  Essential hypertension - Plan: amLODipine (NORVASC) 5 MG tablet, Comprehensive metabolic panel, CBC  -Still decreased control, increase amlodipine to 5 mg daily, check labs, recheck 6 months, sooner if persistent elevations with home monitoring.  Erectile dysfunction, unspecified erectile dysfunction type - Plan: tadalafil (CIALIS) 10 MG tablet  -Refill Cialis, RTC precautions if ineffective, potential risks, side effects have been  discussed.  Hypothyroidism, unspecified type - Plan: levothyroxine (SYNTHROID) 100 MCG tablet, TSH  -Check labs, continue same dose Synthroid  Multiple subsegmental pulmonary emboli without acute cor pulmonale (HCC) - Plan: rivaroxaban (XARELTO) 20 MG TABS tablet  -Continue Xarelto, RTC precautions if persistent bleeding/new bleeding.  Meds ordered this encounter  Medications  . amLODipine (NORVASC) 5 MG tablet    Sig: Take 1 tablet (5 mg total) by mouth daily.    Dispense:  90 tablet    Refill:  2  . tadalafil (CIALIS) 10 MG tablet    Sig: Take 0.5-1 tablets (5-10 mg total) by mouth as needed.    Dispense:  15 tablet    Refill:  5    N52.9.  Marland Kitchen levothyroxine (SYNTHROID) 100 MCG tablet    Sig: Take 1 tablet (100 mcg total) by mouth daily.    Dispense:  90 tablet    Refill:  2  . rivaroxaban (XARELTO) 20 MG TABS tablet    Sig: TAKE 1 TABLET (20 MG TOTAL) BY MOUTH DAILY WITH SUPPER.    Dispense:  90 tablet    Refill:  2   Patient Instructions   Keep a record of your blood pressures outside of the office and bring them to the next office visit.  If readings remain over 140/90 on higher dose of amlodipine, let me know.  No other med changes today.  Recheck in 6 months.     If you have lab work done today you will be contacted with your lab results within the next 2 weeks.  If you have not heard from [place] then please contact us. The fastest way to get your results is to register for My Chart.   IF you received an x-ray today, you will receive an invoice from [facility]. Please contact [facility] at [phone] with questions or concerns regarding your invoice.   IF you received labwork today, you will receive an invoice from [facility]. Please contact [facility] at [phone] with questions or concerns  regarding your invoice.   Our billing staff will not be able to assist you with questions regarding bills from these companies.  You will be contacted with the  lab results as soon as they are available. The fastest way to get your results is to activate your My Chart account. Instructions are located on the last page of this paperwork. If you have not heard from [place] regarding the results in 2 weeks, please contact this office.         Signed, Merri Ray, MD [facility]

## 2020-06-04 LAB — CBC
Hematocrit: 45.7 % (ref 37.5–51.0)
Hemoglobin: 15.8 g/dL (ref 13.0–17.7)
MCH: 31.9 pg (ref 26.6–33.0)
MCHC: 34.6 g/dL (ref 31.5–35.7)
MCV: 92 fL (ref 79–97)
Platelets: 233 10*3/uL (ref 150–450)
RBC: 4.96 x10E6/uL (ref 4.14–5.80)
RDW: 12.8 % (ref 11.6–15.4)
WBC: 6.3 10*3/uL (ref 3.4–10.8)

## 2020-06-04 LAB — COMPREHENSIVE METABOLIC PANEL
ALT: 12 IU/L (ref 0–44)
AST: 24 IU/L (ref 0–40)
Albumin/Globulin Ratio: 1.8 (ref 1.2–2.2)
Albumin: 4.4 g/dL (ref 3.7–4.7)
Alkaline Phosphatase: 65 IU/L (ref 44–121)
BUN/Creatinine Ratio: 10 (ref 10–24)
BUN: 9 mg/dL (ref 8–27)
Bilirubin Total: 1.5 mg/dL — ABNORMAL HIGH (ref 0.0–1.2)
CO2: 25 mmol/L (ref 20–29)
Calcium: 9.5 mg/dL (ref 8.6–10.2)
Chloride: 101 mmol/L (ref 96–106)
Creatinine, Ser: 0.86 mg/dL (ref 0.76–1.27)
GFR calc Af Amer: 100 mL/min/{1.73_m2} (ref >59)
GFR calc non Af Amer: 87 mL/min/{1.73_m2} (ref >59)
Globulin, Total: 2.5 g/dL (ref 1.5–4.5)
Glucose: 93 mg/dL (ref 65–99)
Potassium: 4.6 mmol/L (ref 3.5–5.2)
Sodium: 141 mmol/L (ref 134–144)
Total Protein: 6.9 g/dL (ref 6.0–8.5)

## 2020-06-04 LAB — TSH: TSH: 2.92 u[IU]/mL (ref 0.450–4.500)

## 2020-08-15 ENCOUNTER — Other Ambulatory Visit: Payer: Self-pay | Admitting: Family Medicine

## 2020-08-15 DIAGNOSIS — I10 Essential (primary) hypertension: Secondary | ICD-10-CM

## 2020-08-26 ENCOUNTER — Other Ambulatory Visit: Payer: Self-pay | Admitting: Family Medicine

## 2020-08-26 DIAGNOSIS — I10 Essential (primary) hypertension: Secondary | ICD-10-CM

## 2020-10-08 ENCOUNTER — Other Ambulatory Visit: Payer: Self-pay | Admitting: *Deleted

## 2020-10-08 DIAGNOSIS — I712 Thoracic aortic aneurysm, without rupture, unspecified: Secondary | ICD-10-CM

## 2020-11-10 ENCOUNTER — Other Ambulatory Visit: Payer: Self-pay | Admitting: Family Medicine

## 2020-11-10 DIAGNOSIS — I10 Essential (primary) hypertension: Secondary | ICD-10-CM

## 2020-11-11 ENCOUNTER — Encounter: Payer: Self-pay | Admitting: Hematology

## 2020-11-18 ENCOUNTER — Telehealth: Payer: Self-pay

## 2020-11-18 NOTE — Telephone Encounter (Signed)
Called to offer a colonoscopy to Tyler Randall. Left a message for him to return call. He needs a hospital case. This will have to be with one of the other providers as [facility] has no current openings

## 2020-12-02 ENCOUNTER — Ambulatory Visit (INDEPENDENT_AMBULATORY_CARE_PROVIDER_SITE_OTHER): Payer: Medicare HMO | Admitting: Physician Assistant

## 2020-12-02 ENCOUNTER — Ambulatory Visit
Admission: RE | Admit: 2020-12-02 | Discharge: 2020-12-02 | Disposition: A | Discharge status: Home / Self Care | Payer: Medicare HMO | Source: Ambulatory Visit | Attending: Cardiothoracic Surgery | Admitting: Cardiothoracic Surgery

## 2020-12-02 ENCOUNTER — Encounter: Payer: Self-pay | Admitting: Physician Assistant

## 2020-12-02 ENCOUNTER — Other Ambulatory Visit: Payer: Self-pay

## 2020-12-02 VITALS — BP 137/83 | HR 83 | Resp 20 | Ht 74.0 in | Wt 135.0 lb

## 2020-12-02 DIAGNOSIS — I712 Thoracic aortic aneurysm, without rupture, unspecified: Secondary | ICD-10-CM

## 2020-12-02 MED ORDER — IOPAMIDOL (ISOVUE-370) INJECTION 76%
75.0000 mL | Freq: Once | INTRAVENOUS | Status: AC | PRN
  Administered 2020-12-02: 75 mL via INTRAVENOUS

## 2020-12-02 NOTE — Progress Notes (Signed)
[address]       [place] [address]             [phone]     CARDIOTHORACIC SURGERY OFFICE NOTE  Referring Provider is [name], MD Primary Cardiologist is No primary care provider on file. 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 Returns today for serial f/u. No complaints of chest pain or SOB. He is uncertain as to his BP at home, but he has been following up with his PCP and it usually stays below [id number] systolic.  Tyler Randall has been keeping active at home and has been walking 2 miles a day 3 times a week and has been eating a healthy diet.  He has maintained a healthy weight.  His primary care recently increased his amlodipine to 10 mg a day.   Current Outpatient Medications on File Prior to Visit  Medication Sig Dispense Refill   amLODipine (NORVASC) 5 MG tablet Take 1 tablet (5 mg total) by mouth daily. 90 tablet 2   diphenhydrAMINE (BENADRYL) 50 MG tablet Take 50 mg by mouth at bedtime as needed for itching.     levothyroxine (SYNTHROID) 100 MCG tablet Take 1 tablet (100 mcg total) by mouth daily. 90 tablet 2   Multiple Vitamins-Minerals (CENTRUM SILVER PO) Take 1 tablet by mouth.     rivaroxaban (XARELTO) 20 MG TABS tablet TAKE 1 TABLET (20 MG TOTAL) BY MOUTH DAILY WITH SUPPER. 90 tablet 2   tadalafil (CIALIS) 10 MG tablet Take 0.5-1 tablets (5-10 mg total) by mouth as needed. 15 tablet 5   No current facility-administered medications on file prior to visit.       Physical Exam:  Vitals:   12/02/20 0937  BP: 137/83  Pulse: 83  Resp: 20  SpO2: 96%   Cor: Regular rate and rhythm, no murmur Pulmonary: Clear to auscultation bilaterally and in all fields Abdomen: No tenderness Lower extremity: No edema noted   Diagnostic Tests:  CLINICAL DATA:  Follow-up thoracic aortic aneurysm.  Former smoker.   EXAM: CT ANGIOGRAPHY CHEST WITH CONTRAST   TECHNIQUE: Multidetector CT  imaging of the chest was performed using the standard protocol during bolus administration of intravenous contrast. Multiplanar CT image reconstructions and MIPs were obtained to evaluate the vascular anatomy.   CONTRAST:  29m ISOVUE-370 IOPAMIDOL (ISOVUE-370) INJECTION 76%   COMPARISON:  12/01/2019; 08/19/2019; 02/25/2019; 11/14/2018; 11/13/2018   FINDINGS: Vascular Findings:   Stable mild fusiform aneurysmal dilatation of the ascending thoracic aorta with measurements as follows. The thoracic aorta tapers to a normal caliber at the level of the aortic arch. Scattered atherosclerotic plaque involving the descending thoracic aorta, not resulting in a hemodynamically significant stenosis. No evidence of thoracic aortic dissection or periaortic stranding.   Conventional configuration of the aortic arch. The branch vessels of the aortic arch appear patent throughout their imaged courses.   Normal heart size.  No pericardial effusion.   Although this examination was not tailored for the evaluation the pulmonary arteries, there are no discrete filling defects within the central pulmonary arterial tree to suggest central pulmonary embolism. Normal caliber of the main pulmonary artery.   -------------------------------------------------------------   Thoracic aortic measurements:   SINOTUBULAR JUNCTION: 30 mm as measured in greatest oblique short axis coronal dimension.   PROXIMAL ASCENDING THORACIC AORTA: 46 mm as measured in greatest oblique short axis axial dimension at  the level of the main pulmonary artery (axial image 90, series 5) and approximately 46 mm in greatest oblique short axis coronal diameter (coronal image 60, series 10), unchanged compared to the 11/2018 examination   AORTIC ARCH: 32 mm as measured in greatest oblique short axis sagittal dimension.   PROXIMAL DESCENDING THORACIC AORTA: 25 mm as measured in greatest oblique short axis axial dimension at the  level of the main pulmonary artery.   DISTAL DESCENDING THORACIC AORTA: 26 mm as measured in greatest oblique short axis axial dimension at the level of the diaphragmatic hiatus.   Review of the MIP images confirms the above findings.   -------------------------------------------------------------   Non-Vascular Findings:   Mediastinum/Lymph Nodes: Mildly prominent though non pathologically enlarged pretracheal lymph node measures 0.8 cm in greatest short axis diameter (image 73, series 5, similar to the 11/2018 examination and likely reactive in etiology. No bulky mediastinal, hilar axillary lymphadenopathy.   Lungs/Pleura: Minimal atelectasis/scarring involving the right costophrenic angle and inferior aspect of the right middle lobe at location of previous pneumonia demonstrated on remote chest CT performed 11/2018. No new focal airspace opacities. No pleural effusion or pneumothorax. Central pulmonary airways appear patent. No discrete pulmonary nodules.   Upper abdomen: Limited early arterial phase evaluation of the upper abdomen is unremarkable.   Musculoskeletal: No acute or aggressive osseous abnormalities.   IMPRESSION: 1. Stable uncomplicated fusiform aneurysmal dilatation of the ascending thoracic aorta measuring 46 mm in diameter, unchanged compared to the 11/2018 examination. Recommend semi-annual imaging followup by CTA or MRA and referral to cardiothoracic surgery if not already obtained. This recommendation follows 2010 ACCF/AHA/AATS/ACR/ASA/SCA/SCAI/SIR/STS/SVM Guidelines for the Diagnosis and Management of Patients With Thoracic Aortic Disease. Circulation. 2010; [id number]. Aortic aneurysm NOS (ICD10-I71.9) 2. Stable scarring at the level of the right costophrenic angle and inferior aspect of the right middle lobe, the sequela of previous infection demonstrated on remote chest CT performed 11/2018.   Impression:  Stable uncomplicated ascending  aortic aneurysm.  Results were reviewed with the patient and questions answered.  Plan:  F/u in one year with repeat CTA chest for annual aneurysmal surveillance Work with primary care to ensure BP is well-controlled, he does not currently have a cardiologist Report to local ER for unremitting Chest/back pain  I spent in excess of 61mnutes during the conduct of this office consultation and >50% of this time involved direct face-to-face encounter with the patient for counseling and/or coordination of their care.  Level 2                 10 minutes Level 3                 15 minutes Level 4                 25 minutes Level 5                 40 minutes  TNicholes Rough PA-C

## 2020-12-03 IMAGING — CT CT CHEST W/O CM
2 of 4 series · 15 of 36 positions shown, 18 images · non-contrast
Comparison: February 25, 2019

CLINICAL DATA: Follow-up right middle lobe and right lower lobe
opacities.

EXAM:
CT CHEST WITHOUT CONTRAST
TECHNIQUE: Multidetector CT imaging of the chest was performed following the
standard protocol without IV contrast.

[Series 2: thorax · axial · 0.72mm/px · z∈[-395,-75]mm · 12 of 190 slices shown, 15 images]
[im 15/190  mediastinal]
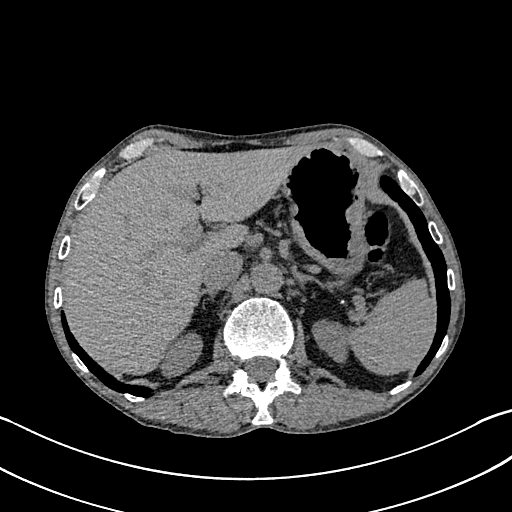
[im 15/190  lung]
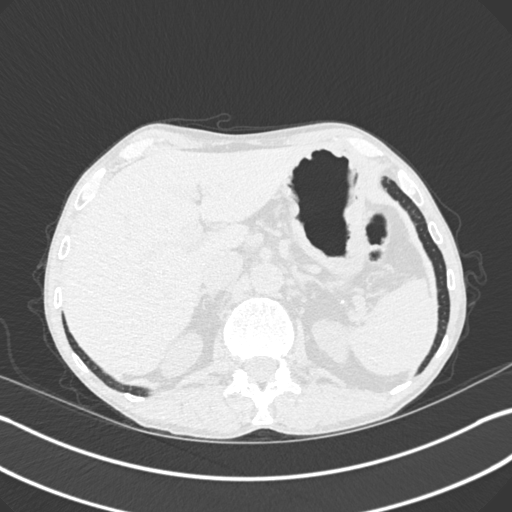
[im 30/190  lung]
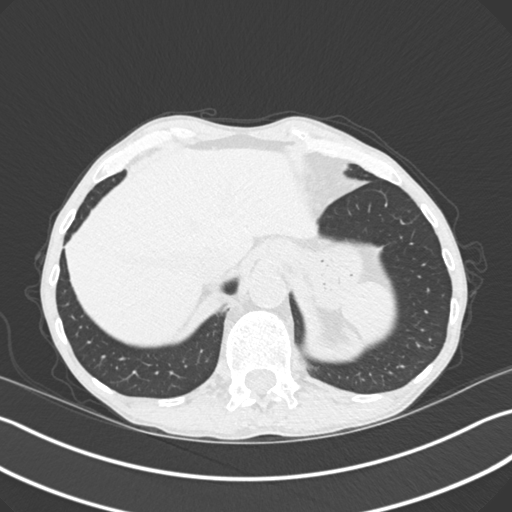
[im 44/190  lung]
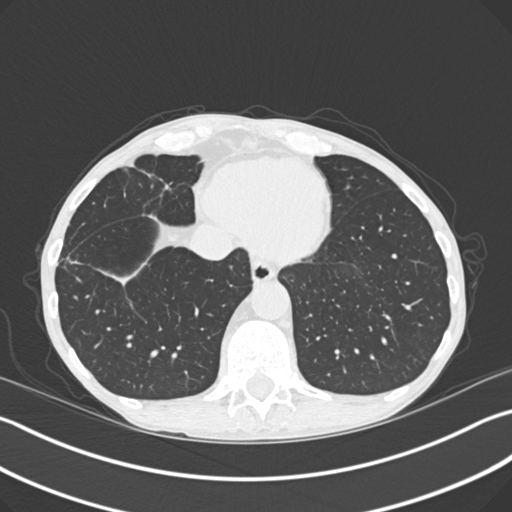
[im 59/190  lung]
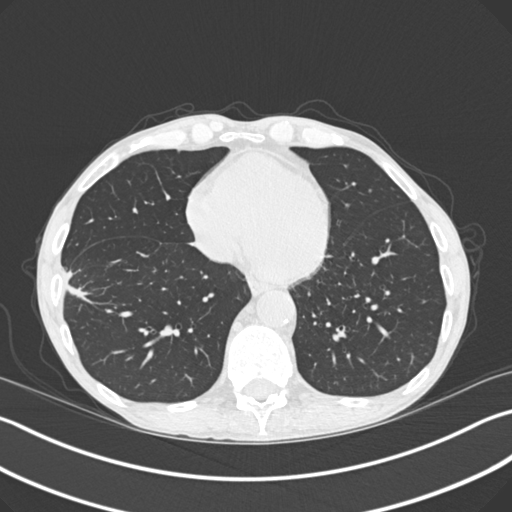
[im 73/190  mediastinal]
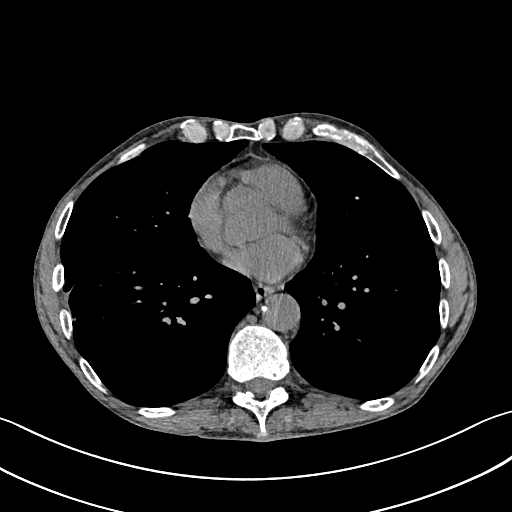
[im 73/190  lung]
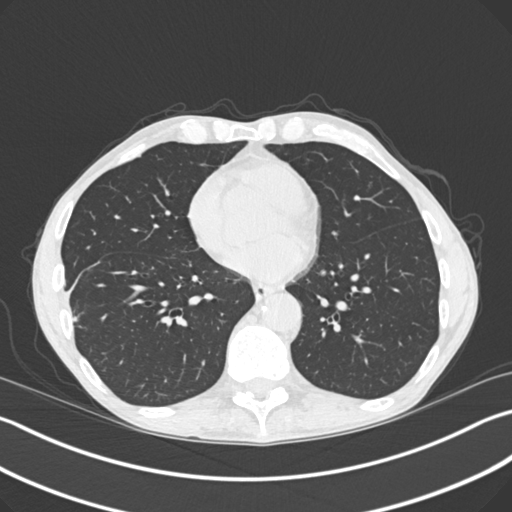
[im 88/190  lung]
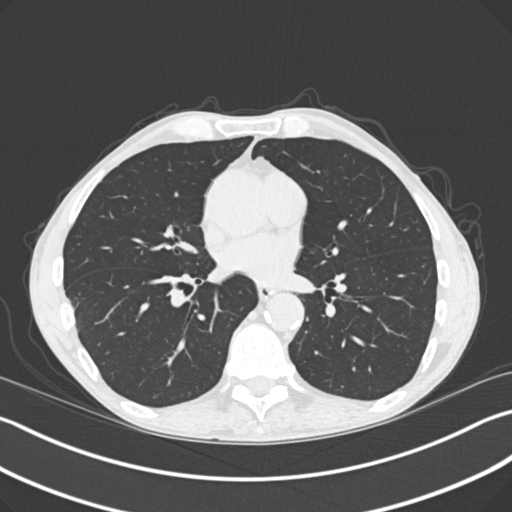
[im 102/190  lung]
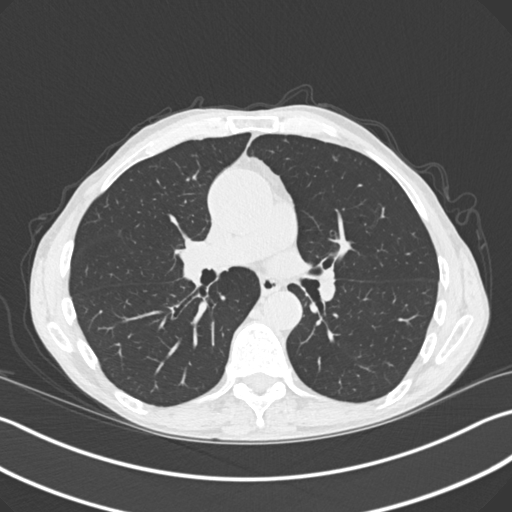
[im 117/190  lung]
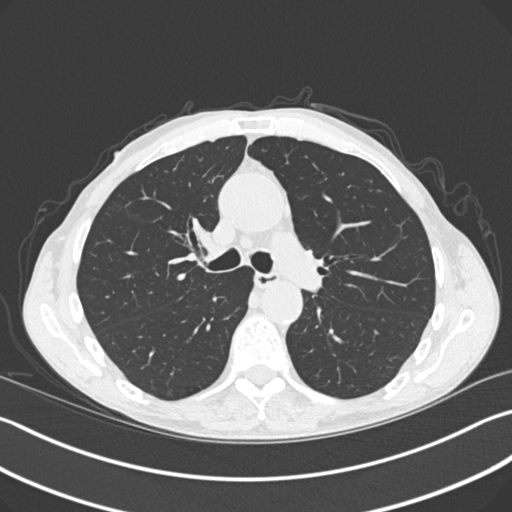
[im 131/190  mediastinal]
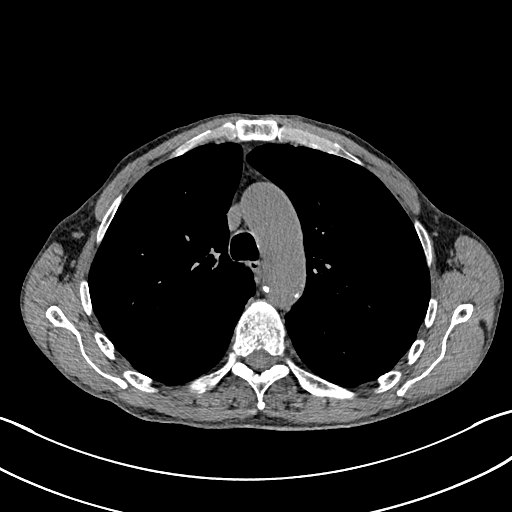
[im 131/190  lung]
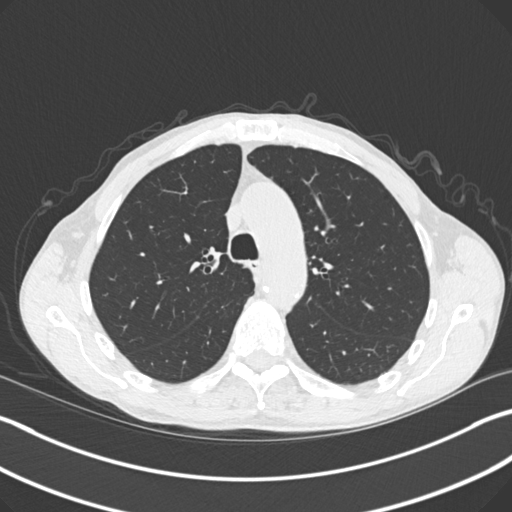
[im 146/190  lung]
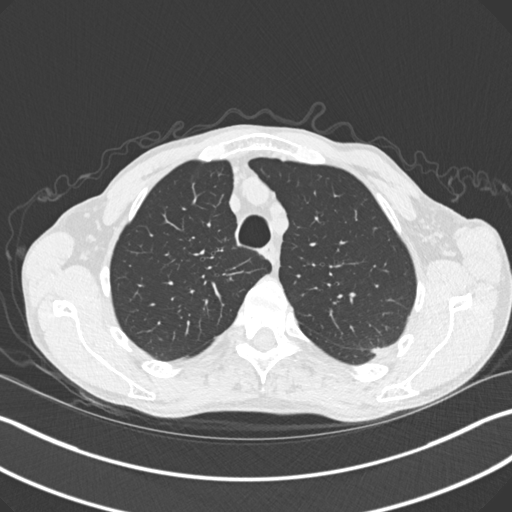
[im 160/190  lung]
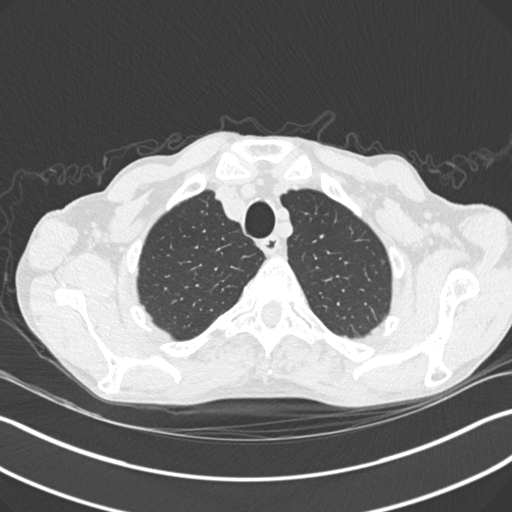
[im 175/190  lung]
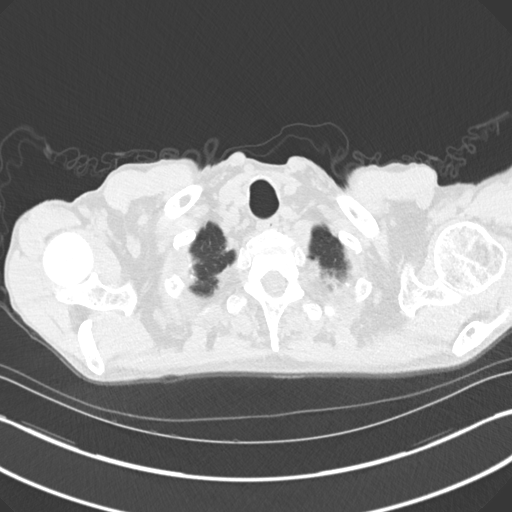

[Series 5: coronal · coronal · 0.65mm/px · 3 of 127 slices shown]
[im 26/127  lung]
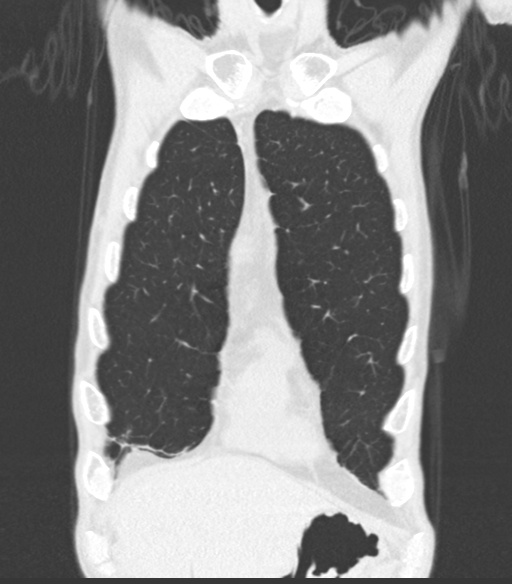
[im 51/127  lung]
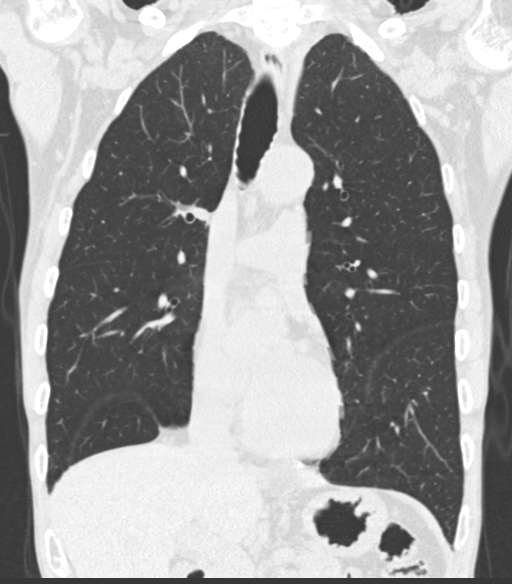
[im 76/127  lung]
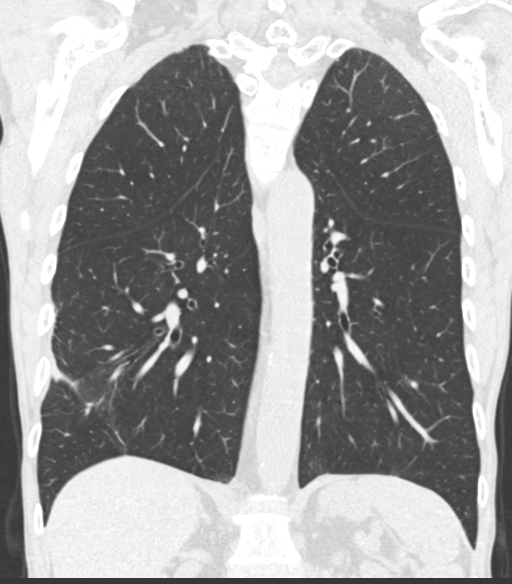

[15 of 36 positions shown; findings below may reference images not displayed]

FINDINGS: Cardiovascular: There is moderate severity calcification of the
thoracic aorta. The ascending thoracic aorta measures approximately
4.5 cm in diameter. Normal heart size. No pericardial effusion.

Mediastinum/Nodes: No enlarged mediastinal or axillary lymph nodes.
Thyroid gland, trachea, and esophagus demonstrate no significant
findings.

Lungs/Pleura: Mild, stable areas of scarring and/or atelectasis are
seen along the inferior aspect of the right middle lobe and
anterolateral aspect of the right lower lobe.

There is no evidence of a pleural effusion or pneumothorax.

Upper Abdomen: There is a small hiatal hernia.

Colonic diverticula are seen along the splenic flexure.

Musculoskeletal: No chest wall mass or suspicious bone lesions
identified.
IMPRESSION: 1. Stable mild, stable areas of scarring and/or atelectasis along
the inferior aspect of the right middle lobe and anterolateral
aspect of the right lower lobe.
2. Small hiatal hernia.
3. Colonic diverticulosis.

Aortic Atherosclerosis ([id number]).

## 2020-12-22 ENCOUNTER — Encounter: Payer: Self-pay | Admitting: Family Medicine

## 2021-02-04 ENCOUNTER — Encounter: Payer: Self-pay | Admitting: Family Medicine

## 2021-02-04 ENCOUNTER — Other Ambulatory Visit: Payer: Self-pay

## 2021-02-04 ENCOUNTER — Ambulatory Visit (INDEPENDENT_AMBULATORY_CARE_PROVIDER_SITE_OTHER): Payer: Medicare HMO | Admitting: Family Medicine

## 2021-02-04 VITALS — BP 138/66 | HR 79 | Temp 98.1°F | Resp 16 | Ht 74.0 in | Wt 139.0 lb

## 2021-02-04 DIAGNOSIS — Z131 Encounter for screening for diabetes mellitus: Secondary | ICD-10-CM | POA: Diagnosis not present

## 2021-02-04 DIAGNOSIS — C61 Malignant neoplasm of prostate: Secondary | ICD-10-CM

## 2021-02-04 DIAGNOSIS — Z13220 Encounter for screening for lipoid disorders: Secondary | ICD-10-CM

## 2021-02-04 DIAGNOSIS — E039 Hypothyroidism, unspecified: Secondary | ICD-10-CM | POA: Diagnosis not present

## 2021-02-04 DIAGNOSIS — I10 Essential (primary) hypertension: Secondary | ICD-10-CM | POA: Diagnosis not present

## 2021-02-04 DIAGNOSIS — Z130 Encounter for screening for diseases of the blood and blood-forming organs and certain disorders involving the immune mechanism: Secondary | ICD-10-CM

## 2021-02-04 DIAGNOSIS — Z0000 Encounter for general adult medical examination without abnormal findings: Secondary | ICD-10-CM

## 2021-02-04 DIAGNOSIS — M79652 Pain in left thigh: Secondary | ICD-10-CM

## 2021-02-04 DIAGNOSIS — Z23 Encounter for immunization: Secondary | ICD-10-CM | POA: Diagnosis not present

## 2021-02-04 DIAGNOSIS — I2694 Multiple subsegmental pulmonary emboli without acute cor pulmonale: Secondary | ICD-10-CM

## 2021-02-04 LAB — COMPREHENSIVE METABOLIC PANEL
ALT: 16 U/L (ref 0–53)
AST: 25 U/L (ref 0–37)
Albumin: 4.3 g/dL (ref 3.5–5.2)
Alkaline Phosphatase: 55 U/L (ref 39–117)
BUN: 8 mg/dL (ref 6–23)
CO2: 31 mEq/L (ref 19–32)
Calcium: 9.4 mg/dL (ref 8.4–10.5)
Chloride: 101 mEq/L (ref 96–112)
Creatinine, Ser: 0.79 mg/dL (ref 0.40–1.50)
GFR: 88.44 mL/min (ref >60.00)
Glucose, Bld: 85 mg/dL (ref 70–99)
Potassium: 4.2 mEq/L (ref 3.5–5.1)
Sodium: 141 mEq/L (ref 135–145)
Total Bilirubin: 1.5 mg/dL — ABNORMAL HIGH (ref 0.2–1.2)
Total Protein: 7.1 g/dL (ref 6.0–8.3)

## 2021-02-04 LAB — CBC WITH DIFFERENTIAL/PLATELET
Basophils Absolute: 0 10*3/uL (ref 0.0–0.1)
Basophils Relative: 0.9 % (ref 0.0–3.0)
Eosinophils Absolute: 0.1 10*3/uL (ref 0.0–0.7)
Eosinophils Relative: 1 % (ref 0.0–5.0)
HCT: 49.5 % (ref 39.0–52.0)
Hemoglobin: 16.3 g/dL (ref 13.0–17.0)
Lymphocytes Relative: 21.4 % (ref 12.0–46.0)
Lymphs Abs: 1 10*3/uL (ref 0.7–4.0)
MCHC: 32.9 g/dL (ref 30.0–36.0)
MCV: 99.7 fl (ref 78.0–100.0)
Monocytes Absolute: 0.7 10*3/uL (ref 0.1–1.0)
Monocytes Relative: 14.4 % — ABNORMAL HIGH (ref 3.0–12.0)
Neutro Abs: 3 10*3/uL (ref 1.4–7.7)
Neutrophils Relative %: 62.3 % (ref 43.0–77.0)
Platelets: 211 10*3/uL (ref 150.0–400.0)
RBC: 4.97 Mil/uL (ref 4.22–5.81)
RDW: 12.9 % (ref 11.5–15.5)
WBC: 4.9 10*3/uL (ref 4.0–10.5)

## 2021-02-04 LAB — LIPID PANEL
Cholesterol: 204 mg/dL — ABNORMAL HIGH (ref 0–200)
HDL: 104.8 mg/dL (ref >39.00)
LDL Cholesterol: 87 mg/dL (ref 0–99)
NonHDL: 99.36
Total CHOL/HDL Ratio: 2
Triglycerides: 62 mg/dL (ref 0.0–149.0)
VLDL: 12.4 mg/dL (ref 0.0–40.0)

## 2021-02-04 LAB — PSA: PSA: 1.68 ng/mL (ref 0.10–4.00)

## 2021-02-04 LAB — HEMOGLOBIN A1C: Hgb A1c MFr Bld: 5.1 % (ref 4.6–6.5)

## 2021-02-04 LAB — TSH: TSH: 3.77 u[IU]/mL (ref 0.35–5.50)

## 2021-02-04 MED ORDER — LEVOTHYROXINE SODIUM 100 MCG PO TABS: 100.0000 ug | ORAL_TABLET | Freq: Every day | ORAL | 2 refills | Status: DC

## 2021-02-04 MED ORDER — RIVAROXABAN 20 MG PO TABS: ORAL_TABLET | ORAL | 2 refills | Status: DC

## 2021-02-04 MED ORDER — AMLODIPINE BESYLATE 5 MG PO TABS: 5.0000 mg | ORAL_TABLET | Freq: Every day | ORAL | 2 refills | Status: DC

## 2021-02-04 NOTE — Progress Notes (Signed)
Subjective:  Patient ID: Tyler Randall, male    DOB: 10-01-1945  Age: 75 y.o. MRN: [id number]  CC:  Chief Complaint  Patient presents with   Medicare Wellness    Pt reports some pain in Lt knee that is reproducible otherwise in good health     HPI Tyler Randall presents for   Presents for annual wellness exam, med review and other concern as above  Care team: PCP: me Hematology: Dr. Irene Limbo Vascular: Dr. Orvan Seen, new provider - PA at vascular.  Dermatology: Wilhemina Bonito (hx of melanoma  in 2003), yearly visits.   Hypercoagulable state with polycythemia, history of DVT/PE Pulmonary embolus August 2020 treated with Xarelto, followed by hematology, pulmonology, hematologist Dr. Irene Limbo.  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  Treated with Xarelto 20 mg daily.  Consideration of bone marrow biopsy if significant increase in hematocrit. No new bleeding, or blood in stool. No further nose bleeds or dizziness as experienced initially.   Hypertension: Amlodipine 80m qd (increased dose in February).  Hx of alcohol use, 2-4 beers per day - decreased from prior use. Ascending aortic aneurysm, followed by vascular. CT chest 12/02/20 - stable, 412m Semiannual imaging.  Home readings: 130/80 range.  BP Readings from Last 3 Encounters:  02/04/21 138/66  12/02/20 137/83  06/03/20 (!) 156/86   Lab Results  Component Value Date   CREATININE 0.86 06/03/2020   Hypothyroidism: Lab Results  Component Value Date   TSH 2.920 06/03/2020  Taking medication daily. Synthroid 10025mqd.  No new hot or cold intolerance. No new hair or skin changes, heart palpitations or new fatigue. No new weight changes.   Erectile dysfunction: Treated with Cialis 68m71m new vision/hearing changes. No CP with exertion or new med side effects and is effective at current dose.   L knee pain: Past month or so.  Sore on top of skin of knee, notices if leaning on distal thigh with pressure, better without pressure. no skin  changes or swelling. No mechanical symptoms, NKI.    Fall screening Fall Risk  02/04/2021 06/03/2020 06/03/2020 02/05/2020 08/13/2019  Falls in the past year? 0 0 0 0 0  Number falls in past yr: 0 - - - 0  Injury with Fall? 0 - - - 0  Risk for fall due to : No Fall Risks - - - -  Follow up _0    Lighting in home: adequate Loose rugs/carpets/pets: none Stairs: none Grab bars in bathroom:none.  Timed up and go:9 seconds, normal gait, no instability.   Depression Screening: Depression screen PHQ [facility] 02/04/2021 06/03/2020 06/03/2020 02/05/2020 08/13/2019  Decreased Interest 0 0 0 0 0  Down, Depressed, Hopeless 0 0 0 0 0  PHQ - 2 Score 0 0 0 0 0    Cancer Screening: Colon: hx of polyps. Flex sig Dr. DaniLoletha Randall 2021. Adenomatous polyp, recommended full colonoscopy in 1 year. Patient declined. He plans on repeat next year.  Prostate  - hx of prostate CA diagnosed 2003, tx with radioactive seed implant. Continued psa monitoring, no routine urologist.  Lab Results  Component Value Date   PSA1 0.9 05/16/2019   PSA1 1.4 11/09/2016  Yearly visits with Dr. JoneRonnald Randall hx of melanoma.   Immunization History  Administered Date(s) Administered   Fluad Quad(high Dose 65+) 01/10/2019, 02/05/2020   Influenza Split 02/07/2012   Influenza, High Dose Seasonal PF 01/25/2017, 01/02/2018   Influenza,inj,Quad  PF,6+ Mos 02/20/2013, 03/27/2014   Influenza-Unspecified 01/09/2016, 01/02/2018   PFIZER(Purple Top)SARS-COV-2 Vaccination 06/06/2019, 07/01/2019   Pneumococcal Conjugate-13 06/04/2014   Pneumococcal Polysaccharide-23 11/09/2016   Tdap 11/09/2016  Covid booster - 1 month ago.  Flu vaccine today.  Had shingrix at pharmacy.   Functional Status Survey:    Memory Screen: 6CIT Screen 11/27/2018 11/12/2017  What Year? 0 points 0 points  What month? 0 points 0 points   What time? 0 points 0 points  Count back from 20 0 points 0 points  Months in reverse 0 points 0 points  Repeat phrase 0 points 0 points  Total Score 0 0    Alcohol Screening: [place] Office Visit from 08/13/2019 in Primary Care at [facility]  AUDIT-C Score 7     4 beers per day. Decreased use recommended. No legal, family issues. Denies addiction, or difficulty decreasing    Tobacco: none  No results found. Optho/optometry: Last eval 2-3 years ago.   Dental: Every 4 months.   Exercise: Walking 45m 3 days per week.   Advanced Directives:  Does not have and declines info.   History Patient Active Problem List   Diagnosis Date Noted   [place]themia vera ([facility] 03/31/2019   Deep vein thrombosis (DVT) of proximal vein of left lower extremity ([facility] 03/02/2019   Pulmonary embolus ([facility] 03/02/2019   Prostate cancer ([facility] 03/27/2014   Melanoma of skin, site unspecified 10/06/2013   Unspecified hypothyroidism 10/06/2013   Unspecified vitamin D deficiency 10/06/2013   Thyroid condition 11/28/2011   Past Medical History:  Diagnosis Date   Allergy    Depression    Hypothyroidism    Prostate cancer ([facility]    [place]ulmonary embolism ([facility] [place]    Skin cancer    melanoma   Thyroid disease    hypothyroidism   Past Surgical History:  Procedure Laterality Date   COLONOSCOPY  2014   melanoma excision from back      POLYPECTOMY  2014   PROSTATE SURGERY     radiation seed implants     TONSILLECTOMY     Allergies  Allergen Reactions   Microplegia Msa-Msg [Plegisol] Hives and Swelling    M.S.G. in food.   Selenium Gluconate [Selenium] Anaphylaxis and Hives   Latex     With prolonged exposure   Sulfa Antibiotics     Not sure reaction, started in infancy   Prior to Admission medications   Medication Sig Start Date End Date Taking? Authorizing Provider  amLODipine (NORVASC) 5 MG tablet Take 1 tablet (5 mg total) by mouth daily. 06/03/20  Yes GWendie Agreste MD   diphenhydrAMINE (BENADRYL) 50 MG tablet Take 50 mg by mouth at bedtime as needed for itching.   Yes [provider]  levothyroxine (SYNTHROID) 100 MCG tablet Take 1 tablet (100 mcg total) by mouth daily. 06/03/20  Yes GWendie Agreste MD  Multiple Vitamins-Minerals (CENTRUM SILVER PO) Take 1 tablet by mouth.   Yes [provider]  rivaroxaban (XARELTO) 20 MG TABS tablet TAKE 1 TABLET (20 MG TOTAL) BY MOUTH DAILY WITH SUPPER. 06/03/20  Yes GWendie Agreste MD  tadalafil (CIALIS) 10 MG tablet Take 0.5-1 tablets (5-10 mg total) by mouth as needed. 06/03/20  Yes GWendie Agreste MD   Social History   Socioeconomic History   Marital status: Divorced    Spouse name: Not on file   Number of children: Not on file   Years of education: Not on file   Highest education level:  Not on file  Occupational History   Not on file  Tobacco Use   Smoking status: Former    Packs/day: 3.50    Years: 50.00    Pack years: 175.00    Types: Cigarettes    Start date: 83    Quit date: 02/03/2016    Years since quitting: 5.0   Smokeless tobacco: Never   Tobacco comments:    cut down to 1 pack when quit in 2017  Vaping Use   Vaping Use: Former  Substance and Sexual Activity   Alcohol use: Yes    Alcohol/week: 20.0 standard drinks    Types: 20 Cans of beer per week   Drug use: No   Sexual activity: Yes  Other Topics Concern   Not on file  Social History Narrative   Not on file   Social Determinants of Health   Financial Resource Strain: Not on file  Food Insecurity: Not on file  Transportation Needs: Not on file  Physical Activity: Not on file  Stress: Not on file  Social Connections: Not on file  Intimate Partner Violence: Not on file    Review of Systems 13 point review of systems per patient health survey noted.  Negative other than as indicated above or in HPI.    Objective:   Vitals:   02/04/21 0913  BP: 138/66  Pulse: 79  Resp: 16  Temp: 98.1 F (36.7  C)  TempSrc: Temporal  SpO2: 94%  Weight: 139 lb (63 kg)  Height: $Remove'6\' 2"'wqLgbiD$  (1.88 m)     Physical Exam Vitals reviewed.  Constitutional:      Appearance: He is well-developed.  HENT:     Head: Normocephalic and atraumatic.     Right Ear: External ear normal.     Left Ear: External ear normal.     Ears:     Comments: Excess cerumen in canals bilaterally without impaction, gross hearing intact. Eyes:     Conjunctiva/sclera: Conjunctivae normal.     Pupils: Pupils are equal, round, and reactive to light.  Neck:     Thyroid: No thyromegaly.  Cardiovascular:     Rate and Rhythm: Normal rate and regular rhythm.     Heart sounds: Normal heart sounds.  Pulmonary:     Effort: Pulmonary effort is normal. No respiratory distress.     Breath sounds: Normal breath sounds. No wheezing.  Abdominal:     General: There is no distension.     Palpations: Abdomen is soft.     Tenderness: There is no abdominal tenderness.  Musculoskeletal:        General: No tenderness. Normal range of motion.     Cervical back: Normal range of motion and neck supple.     Comments: Left knee/thigh: Skin intact, no rash, no effusion, no skin changes.  Nontender over distal thigh in the area of previous symptoms, no focal bony tenderness or mass apparent.  Left knee without effusion, full range of motion, no bony tenderness, negative Mc[place]ray.  Lymphadenopathy:     Cervical: No cervical adenopathy.  Skin:    General: Skin is warm and dry.  Neurological:     Mental Status: He is alert and oriented to person, place, and time.     Deep Tendon Reflexes: Reflexes are normal and symmetric.  Psychiatric:        Behavior: Behavior normal.       Assessment & Plan:  DEMETRI GOSHERT is a 75 y.o. male . Wellness examination -  Plan: CBC with Differential/Platelet, Comprehensive metabolic panel, Lipid panel, Hemoglobin A1c, PSA, TSH  - -anticipatory guidance as below in AVS, screening labs above. Health maintenance items  as above in HPI discussed/recommended as applicable.   Hypothyroidism, unspecified type - Plan: TSH, levothyroxine (SYNTHROID) 100 MCG tablet  -Check labs, continue Synthroid same dose  Essential hypertension - Plan: Comprehensive metabolic panel, Lipid panel, Hemoglobin A1c, amLODipine (NORVASC) 5 MG tablet  -Stable, continue amlodipine, check labs.  Decrease alcohol use discussed  Screening for deficiency anemia - Plan: CBC with Differential/Platelet  Screening for hyperlipidemia - Plan: Lipid panel  Screening for diabetes mellitus - Plan: Hemoglobin A1c  Prostate cancer ([facility]) - Plan: PSA  -Previous treatment as above, check PSA for ongoing monitoring, possible follow-up with urology depending on levels.  Multiple subsegmental pulmonary emboli without acute cor pulmonale (HCC) - Plan: rivaroxaban (XARELTO) 20 MG TABS tablet  -Chronic anticoagulation without new bleeding.  Continue Xarelto.  Check CBC.   Need for influenza vaccination - Plan: Flu Vaccine QUAD High Dose(Fluad)  Left thigh pain  -Reassuring exam.  Based on description of symptoms possible neuronal impingement or soft tissue issue.  Avoid direct pressure/repetitive pressure to area with RTC precautions if persistent.  Meds ordered this encounter  Medications   amLODipine (NORVASC) 5 MG tablet    Sig: Take 1 tablet (5 mg total) by mouth daily.    Dispense:  90 tablet    Refill:  2   levothyroxine (SYNTHROID) 100 MCG tablet    Sig: Take 1 tablet (100 mcg total) by mouth daily.    Dispense:  90 tablet    Refill:  2   rivaroxaban (XARELTO) 20 MG TABS tablet    Sig: TAKE 1 TABLET (20 MG TOTAL) BY MOUTH DAILY WITH SUPPER.    Dispense:  90 tablet    Refill:  2   Patient Instructions  I do recommend cutting back on alcohol.  If any concerns on labs I will let you know.  I recommend scheduling appointment with eye specialist - let me know if you need a referral.  Leg exam is reassuring today.  Try to avoid direct  pressure or pulling on the skin in that area but if the symptoms continue, please follow-up and we can look into other causes. There was some cerumen/wax in the ears but not enough to have to have it flushed today.  If you have blockage in the ears or that is worse, follow-up and we can certainly try to remove wax in the office. Thanks for coming in today.  Take care.   Health Maintenance After Age 32 After age 83, you are at a higher risk for certain long-term diseases and infections as well as injuries from falls. Falls are a major cause of broken bones and head injuries in people who are older than age 67. Getting regular preventive care can help to keep you healthy and well. Preventive care includes getting regular testing and making lifestyle changes as recommended by your health care provider. Talk with your health care provider about: Which screenings and tests you should have. A screening is a test that checks for a disease when you have no symptoms. A diet and exercise plan that is right for you. What should I know about screenings and tests to prevent falls? Screening and testing are the best ways to find a health problem early. Early diagnosis and treatment give you the best chance of managing medical conditions that are common after age 9.  Certain conditions and lifestyle choices may make you more likely to have a fall. Your health care provider may recommend: Regular vision checks. Poor vision and conditions such as cataracts can make you more likely to have a fall. If you wear glasses, make sure to get your prescription updated if your vision changes. Medicine review. Work with your health care provider to regularly review all of the medicines you are taking, including over-the-counter medicines. Ask your health care provider about any side effects that may make you more likely to have a fall. Tell your health care provider if any medicines that you take make you feel dizzy or  sleepy. Osteoporosis screening. Osteoporosis is a condition that causes the bones to get weaker. This can make the bones weak and cause them to break more easily. Blood pressure screening. Blood pressure changes and medicines to control blood pressure can make you feel dizzy. Strength and balance checks. Your health care provider may recommend certain tests to check your strength and balance while standing, walking, or changing positions. Foot health exam. Foot pain and numbness, as well as not wearing proper footwear, can make you more likely to have a fall. Depression screening. You may be more likely to have a fall if you have a fear of falling, feel emotionally low, or feel unable to do activities that you used to do. Alcohol use screening. Using too much alcohol can affect your balance and may make you more likely to have a fall. What actions can I take to lower my risk of falls? General instructions Talk with your health care provider about your risks for falling. Tell your health care provider if: You fall. Be sure to tell your health care provider about all falls, even ones that seem minor. You feel dizzy, sleepy, or off-balance. Take over-the-counter and prescription medicines only as told by your health care provider. These include any supplements. Eat a healthy diet and maintain a healthy weight. A healthy diet includes low-fat dairy products, low-fat (lean) meats, and fiber from whole grains, beans, and lots of fruits and vegetables. Home safety Remove any tripping hazards, such as rugs, cords, and clutter. Install safety equipment such as grab bars in bathrooms and safety rails on stairs. Keep rooms and walkways well-lit. Activity  Follow a regular exercise program to stay fit. This will help you maintain your balance. Ask your health care provider what types of exercise are appropriate for you. If you need a cane or walker, use it as recommended by your health care provider. Wear  supportive shoes that have nonskid soles. Lifestyle Do not drink alcohol if your health care provider tells you not to drink. If you drink alcohol, limit how much you have: 0-1 drink a day for women. 0-2 drinks a day for men. Be aware of how much alcohol is in your drink. In the [place] one drink equals one typical bottle of beer (12 oz), one-half glass of wine (5 oz), or one shot of hard liquor (1 oz). Do not use any products that contain nicotine or tobacco, such as cigarettes and e-cigarettes. If you need help quitting, ask your health care provider. Summary Having a healthy lifestyle and getting preventive care can help to protect your health and wellness after age 70. Screening and testing are the best way to find a health problem early and help you avoid having a fall. Early diagnosis and treatment give you the best chance for managing medical conditions that are more common for people who  are older than age 28. Falls are a major cause of broken bones and head injuries in people who are older than age 49. Take precautions to prevent a fall at home. Work with your health care provider to learn what changes you can make to improve your health and wellness and to prevent falls. This information is not intended to replace advice given to you by your health care provider. Make sure you discuss any questions you have with your health care provider. Document Revised: 06/04/2020 Document Reviewed: 03/12/2020 Elsevier Patient Education  2022 [place], Adult The ears produce a substance called earwax that helps keep bacteria out of the ear and protects the skin in the ear canal. Occasionally, earwax can build up in the ear and cause discomfort or hearing loss. What are the causes? This condition is caused by a buildup of earwax. Ear canals are self-cleaning. Ear wax is made in the outer part of the ear canal and generally falls out in small amounts over time. When the  self-cleaning mechanism is not working, earwax builds up and can cause decreased hearing and discomfort. Attempting to clean ears with cotton swabs can push the earwax deep into the ear canal and cause decreased hearing and pain. What increases the risk? This condition is more likely to develop in people who: Clean their ears often with cotton swabs. Pick at their ears. Use earplugs or in-ear headphones often, or wear hearing aids. The following factors may also make you more likely to develop this condition: Being male. Being of older age. Naturally producing more earwax. Having narrow ear canals. Having earwax that is overly thick or sticky. Having excess hair in the ear canal. Having eczema. Being dehydrated. What are the signs or symptoms? Symptoms of this condition include: Reduced or muffled hearing. A feeling of fullness in the ear or feeling that the ear is plugged. Fluid coming from the ear. Ear pain or an itchy ear. Ringing in the ear. Coughing. Balance problems. An obvious piece of earwax that can be seen inside the ear canal. How is this diagnosed? This condition may be diagnosed based on: Your symptoms. Your medical history. An ear exam. During the exam, your health care provider will look into your ear with an instrument called an otoscope. You may have tests, including a hearing test. How is this treated? This condition may be treated by: Using ear drops to soften the earwax. Having the earwax removed by a health care provider. The health care provider may: Flush the ear with water. Use an instrument that has a loop on the end (curette). Use a suction device. Having surgery to remove the wax buildup. This may be done in severe cases. Follow these instructions at home:  Take over-the-counter and prescription medicines only as told by your health care provider. Do not put any objects, including cotton swabs, into your ear. You can clean the opening of your ear  canal with a washcloth or facial tissue. Follow instructions from your health care provider about cleaning your ears. Do not overclean your ears. Drink enough fluid to keep your urine pale yellow. This will help to thin the earwax. Keep all follow-up visits as told. If earwax builds up in your ears often or if you use hearing aids, consider seeing your health care provider for routine, preventive ear cleanings. Ask your health care provider how often you should schedule your cleanings. If you have hearing aids, clean them according to instructions from  the manufacturer and your health care provider. Contact a health care provider if: You have ear pain. You develop a fever. You have pus or other fluid coming from your ear. You have hearing loss. You have ringing in your ears that does not go away. You feel like the room is spinning (vertigo). Your symptoms do not improve with treatment. Get help right away if: You have bleeding from the affected ear. You have severe ear pain. Summary Earwax can build up in the ear and cause discomfort or hearing loss. The most common symptoms of this condition include reduced or muffled hearing, a feeling of fullness in the ear, or feeling that the ear is plugged. This condition may be diagnosed based on your symptoms, your medical history, and an ear exam. This condition may be treated by using ear drops to soften the earwax or by having the earwax removed by a health care provider. Do not put any objects, including cotton swabs, into your ear. You can clean the opening of your ear canal with a washcloth or facial tissue. This information is not intended to replace advice given to you by your health care provider. Make sure you discuss any questions you have with your health care provider. Document Revised: 07/15/2019 Document Reviewed: 07/15/2019 Elsevier Patient Education  2022 [place],   Merri Ray, MD [place], [facility] 02/04/21 10:03 AM

## 2021-02-04 NOTE — Patient Instructions (Addendum)
I do recommend cutting back on alcohol.  If any concerns on labs I will let you know.  I recommend scheduling appointment with eye specialist - let me know if you need a referral.  Leg exam is reassuring today.  Try to avoid direct pressure or pulling on the skin in that area but if the symptoms continue, please follow-up and we can look into other causes. There was some cerumen/wax in the ears but not enough to have to have it flushed today.  If you have blockage in the ears or that is worse, follow-up and we can certainly try to remove wax in the office. Thanks for coming in today.  Take care.   Health Maintenance After Age 7 After age 26, you are at a higher risk for certain long-term diseases and infections as well as injuries from falls. Falls are a major cause of broken bones and head injuries in people who are older than age 74. Getting regular preventive care can help to keep you healthy and well. Preventive care includes getting regular testing and making lifestyle changes as recommended by your health care provider. Talk with your health care provider about: Which screenings and tests you should have. A screening is a test that checks for a disease when you have no symptoms. A diet and exercise plan that is right for you. What should I know about screenings and tests to prevent falls? Screening and testing are the best ways to find a health problem early. Early diagnosis and treatment give you the best chance of managing medical conditions that are common after age 59. Certain conditions and lifestyle choices may make you more likely to have a fall. Your health care provider may recommend: Regular vision checks. Poor vision and conditions such as cataracts can make you more likely to have a fall. If you wear glasses, make sure to get your prescription updated if your vision changes. Medicine review. Work with your health care provider to regularly review all of the medicines you are taking,  including over-the-counter medicines. Ask your health care provider about any side effects that may make you more likely to have a fall. Tell your health care provider if any medicines that you take make you feel dizzy or sleepy. Osteoporosis screening. Osteoporosis is a condition that causes the bones to get weaker. This can make the bones weak and cause them to break more easily. Blood pressure screening. Blood pressure changes and medicines to control blood pressure can make you feel dizzy. Strength and balance checks. Your health care provider may recommend certain tests to check your strength and balance while standing, walking, or changing positions. Foot health exam. Foot pain and numbness, as well as not wearing proper footwear, can make you more likely to have a fall. Depression screening. You may be more likely to have a fall if you have a fear of falling, feel emotionally low, or feel unable to do activities that you used to do. Alcohol use screening. Using too much alcohol can affect your balance and may make you more likely to have a fall. What actions can I take to lower my risk of falls? General instructions Talk with your health care provider about your risks for falling. Tell your health care provider if: You fall. Be sure to tell your health care provider about all falls, even ones that seem minor. You feel dizzy, sleepy, or off-balance. Take over-the-counter and prescription medicines only as told by your health care provider. These include  any supplements. Eat a healthy diet and maintain a healthy weight. A healthy diet includes low-fat dairy products, low-fat (lean) meats, and fiber from whole grains, beans, and lots of fruits and vegetables. Home safety Remove any tripping hazards, such as rugs, cords, and clutter. Install safety equipment such as grab bars in bathrooms and safety rails on stairs. Keep rooms and walkways well-lit. Activity  Follow a regular exercise program  to stay fit. This will help you maintain your balance. Ask your health care provider what types of exercise are appropriate for you. If you need a cane or walker, use it as recommended by your health care provider. Wear supportive shoes that have nonskid soles. Lifestyle Do not drink alcohol if your health care provider tells you not to drink. If you drink alcohol, limit how much you have: 0-1 drink a day for women. 0-2 drinks a day for men. Be aware of how much alcohol is in your drink. In the [place] one drink equals one typical bottle of beer (12 oz), one-half glass of wine (5 oz), or one shot of hard liquor (1 oz). Do not use any products that contain nicotine or tobacco, such as cigarettes and e-cigarettes. If you need help quitting, ask your health care provider. Summary Having a healthy lifestyle and getting preventive care can help to protect your health and wellness after age 70. Screening and testing are the best way to find a health problem early and help you avoid having a fall. Early diagnosis and treatment give you the best chance for managing medical conditions that are more common for people who are older than age 80. Falls are a major cause of broken bones and head injuries in people who are older than age 38. Take precautions to prevent a fall at home. Work with your health care provider to learn what changes you can make to improve your health and wellness and to prevent falls. This information is not intended to replace advice given to you by your health care provider. Make sure you discuss any questions you have with your health care provider. Document Revised: 06/04/2020 Document Reviewed: 03/12/2020 Elsevier Patient Education  2022 [place], Adult The ears produce a substance called earwax that helps keep bacteria out of the ear and protects the skin in the ear canal. Occasionally, earwax can build up in the ear and cause discomfort or hearing  loss. What are the causes? This condition is caused by a buildup of earwax. Ear canals are self-cleaning. Ear wax is made in the outer part of the ear canal and generally falls out in small amounts over time. When the self-cleaning mechanism is not working, earwax builds up and can cause decreased hearing and discomfort. Attempting to clean ears with cotton swabs can push the earwax deep into the ear canal and cause decreased hearing and pain. What increases the risk? This condition is more likely to develop in people who: Clean their ears often with cotton swabs. Pick at their ears. Use earplugs or in-ear headphones often, or wear hearing aids. The following factors may also make you more likely to develop this condition: Being male. Being of older age. Naturally producing more earwax. Having narrow ear canals. Having earwax that is overly thick or sticky. Having excess hair in the ear canal. Having eczema. Being dehydrated. What are the signs or symptoms? Symptoms of this condition include: Reduced or muffled hearing. A feeling of fullness in the ear or feeling that the  ear is plugged. Fluid coming from the ear. Ear pain or an itchy ear. Ringing in the ear. Coughing. Balance problems. An obvious piece of earwax that can be seen inside the ear canal. How is this diagnosed? This condition may be diagnosed based on: Your symptoms. Your medical history. An ear exam. During the exam, your health care provider will look into your ear with an instrument called an otoscope. You may have tests, including a hearing test. How is this treated? This condition may be treated by: Using ear drops to soften the earwax. Having the earwax removed by a health care provider. The health care provider may: Flush the ear with water. Use an instrument that has a loop on the end (curette). Use a suction device. Having surgery to remove the wax buildup. This may be done in severe cases. Follow these  instructions at home:  Take over-the-counter and prescription medicines only as told by your health care provider. Do not put any objects, including cotton swabs, into your ear. You can clean the opening of your ear canal with a washcloth or facial tissue. Follow instructions from your health care provider about cleaning your ears. Do not overclean your ears. Drink enough fluid to keep your urine pale yellow. This will help to thin the earwax. Keep all follow-up visits as told. If earwax builds up in your ears often or if you use hearing aids, consider seeing your health care provider for routine, preventive ear cleanings. Ask your health care provider how often you should schedule your cleanings. If you have hearing aids, clean them according to instructions from the manufacturer and your health care provider. Contact a health care provider if: You have ear pain. You develop a fever. You have pus or other fluid coming from your ear. You have hearing loss. You have ringing in your ears that does not go away. You feel like the room is spinning (vertigo). Your symptoms do not improve with treatment. Get help right away if: You have bleeding from the affected ear. You have severe ear pain. Summary Earwax can build up in the ear and cause discomfort or hearing loss. The most common symptoms of this condition include reduced or muffled hearing, a feeling of fullness in the ear, or feeling that the ear is plugged. This condition may be diagnosed based on your symptoms, your medical history, and an ear exam. This condition may be treated by using ear drops to soften the earwax or by having the earwax removed by a health care provider. Do not put any objects, including cotton swabs, into your ear. You can clean the opening of your ear canal with a washcloth or facial tissue. This information is not intended to replace advice given to you by your health care provider. Make sure you discuss any  questions you have with your health care provider. Document Revised: 07/15/2019 Document Reviewed: 07/15/2019 Elsevier Patient Education  [place].

## 2021-02-14 ENCOUNTER — Other Ambulatory Visit: Payer: Self-pay | Admitting: Family Medicine

## 2021-02-14 DIAGNOSIS — I10 Essential (primary) hypertension: Secondary | ICD-10-CM

## 2021-03-17 IMAGING — CT CT CHEST W/O CM
2 of 4 series · 14 of 36 positions shown, 17 images · non-contrast
Comparison: 08/19/2019

CLINICAL DATA: Follow-up thoracic aortic aneurysm.

EXAM:
CT CHEST WITHOUT CONTRAST
TECHNIQUE: Multidetector CT imaging of the chest was performed following the
standard protocol without IV contrast.

[Series 2: chest 2.00 br40 s3 · axial · 0.59mm/px · z∈[+1556,+1870]mm · 11 of 187 slices shown, 14 images (1 of 2)]
[im 15/187  mediastinal]
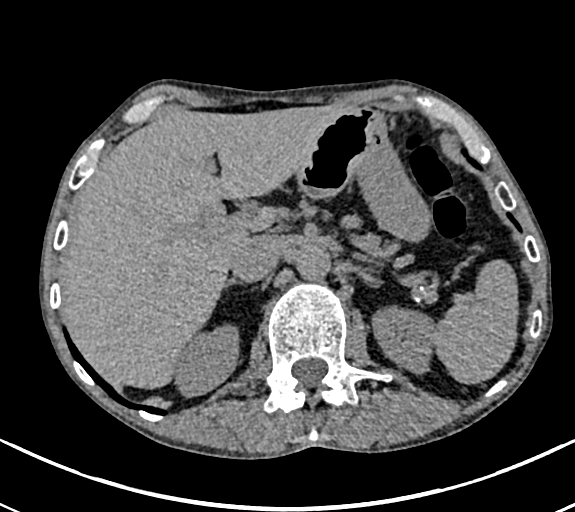
[im 15/187  lung]
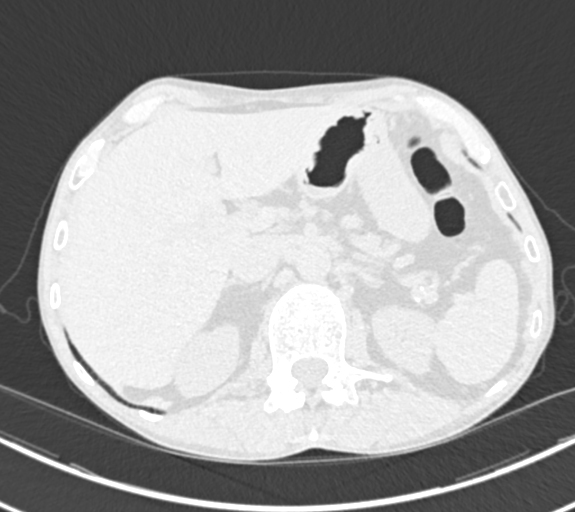
[im 29/187  lung]
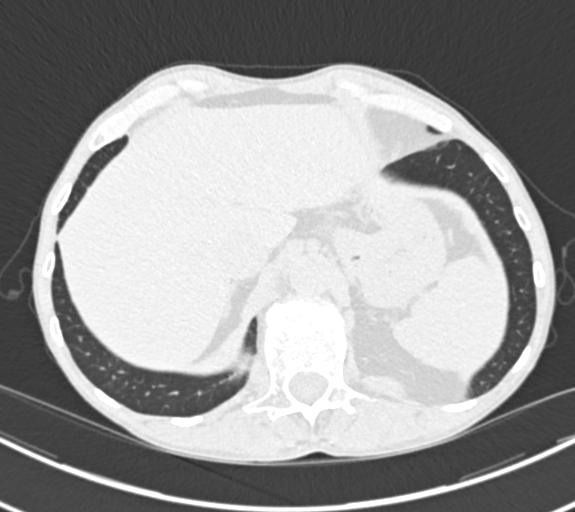
[im 43/187  lung]
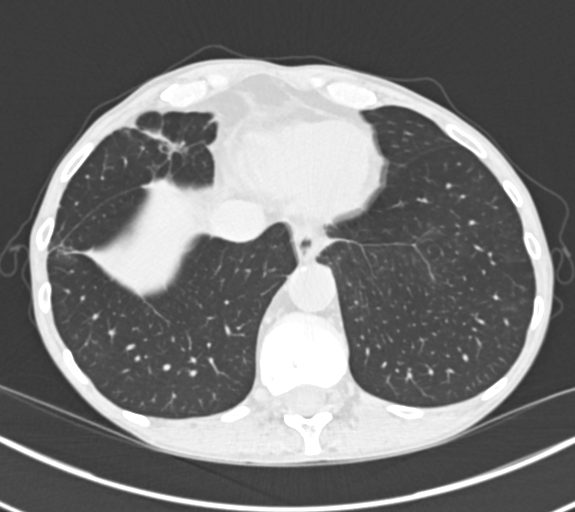
[im 58/187  lung]
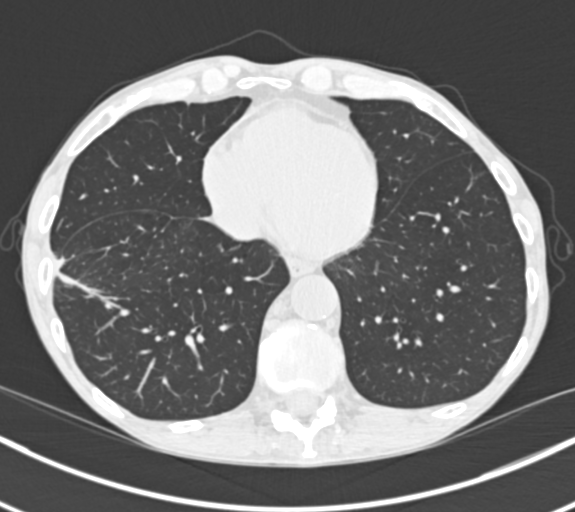
[im 72/187  mediastinal]
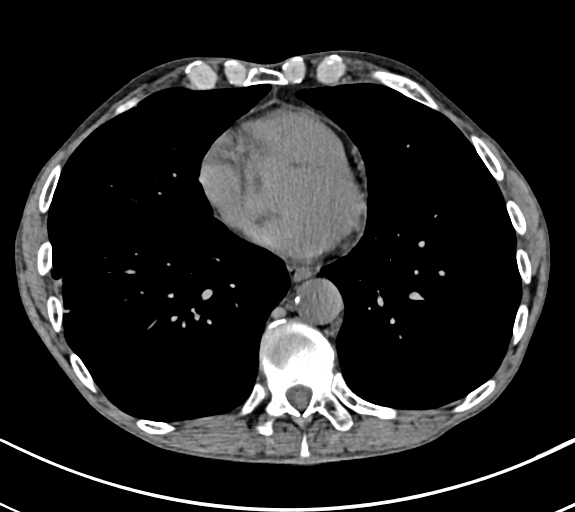
[im 72/187  lung]
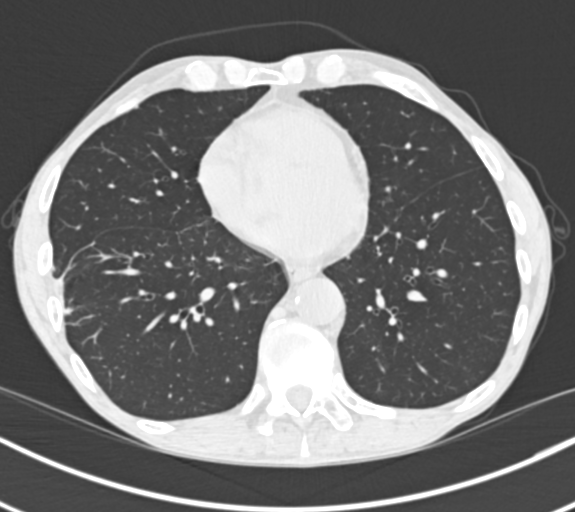
[im 101/187  lung]
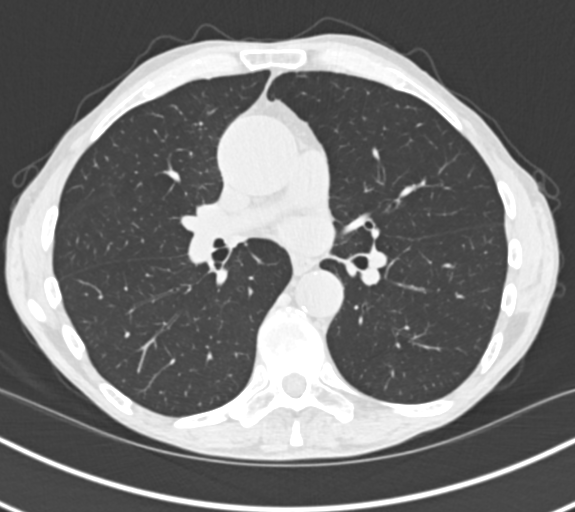
[im 115/187  lung]
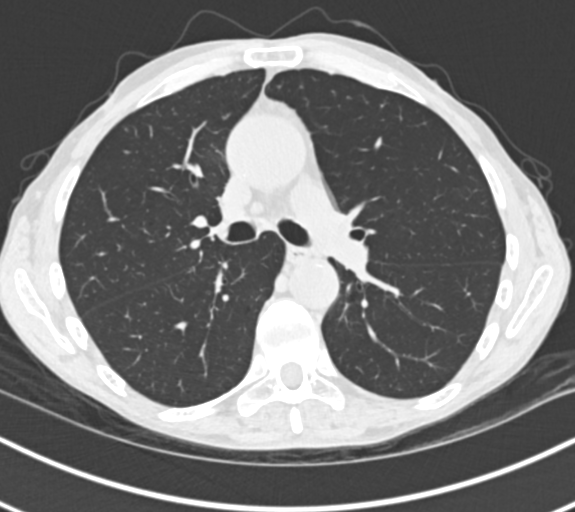
[im 129/187  lung]
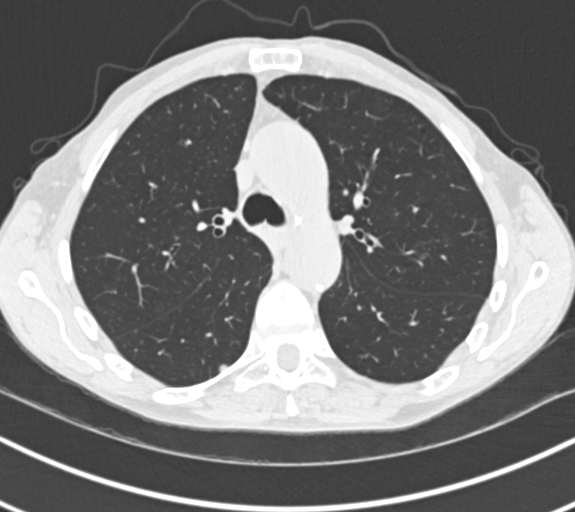
[im 144/187  mediastinal]
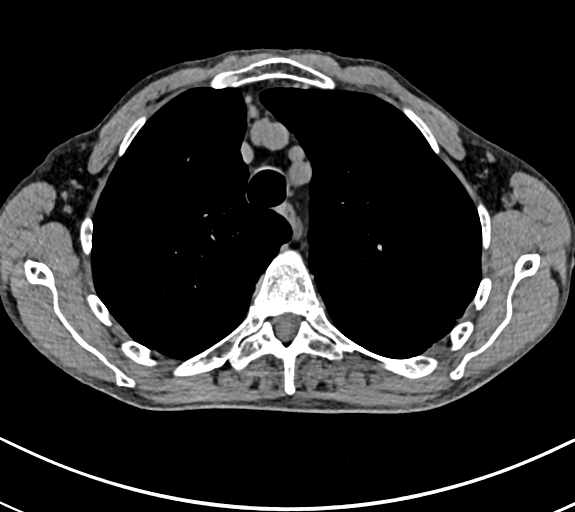
[im 144/187  lung]
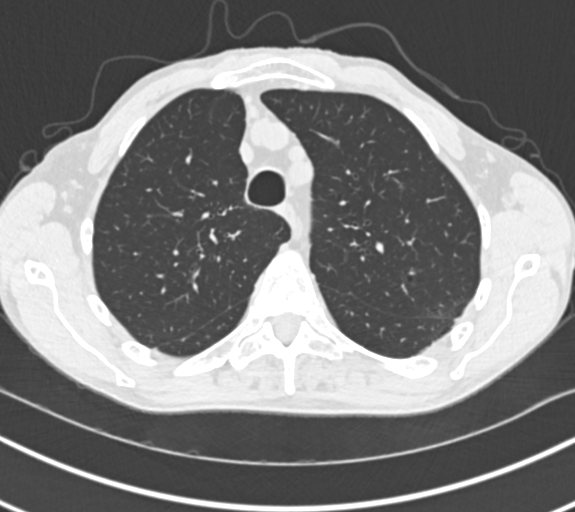
[im 158/187  lung]
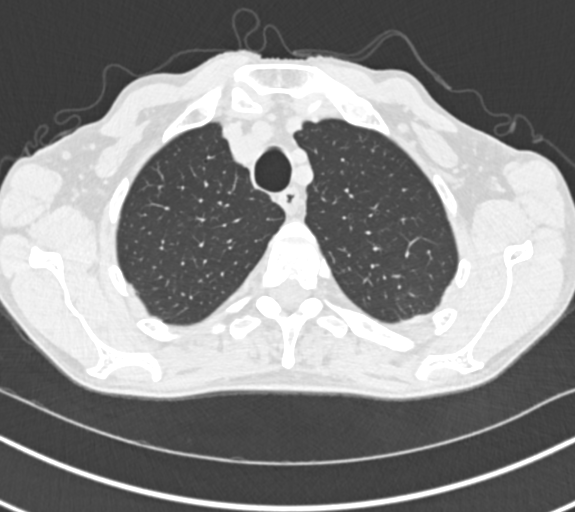
[im 172/187  lung]
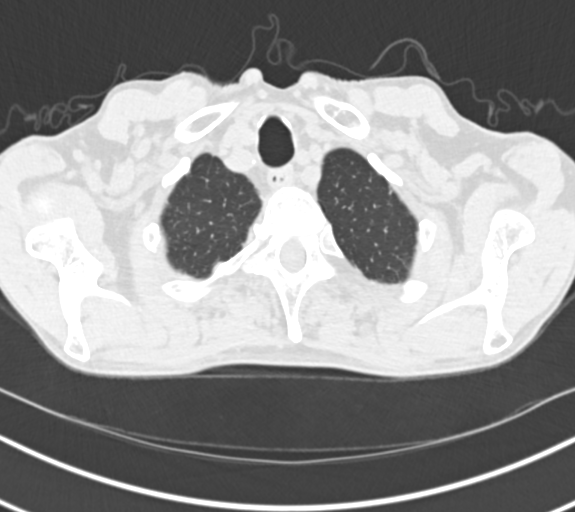

[Series 4: chest 2.00 br40 s3 · coronal · 0.66mm/px · 3 of 151 slices shown (2 of 2)]
[im 31/151  lung]
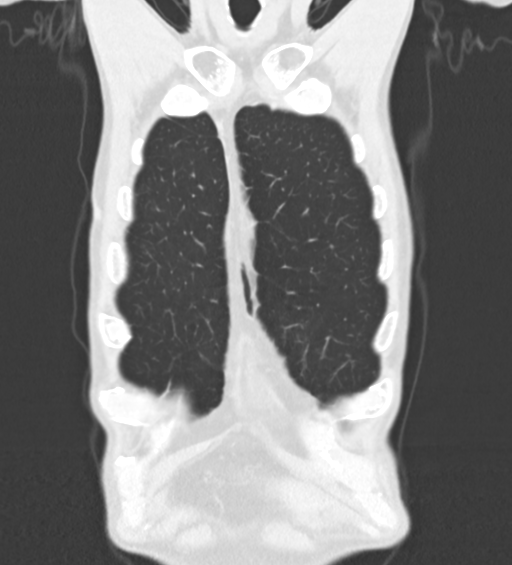
[im 61/151  lung]
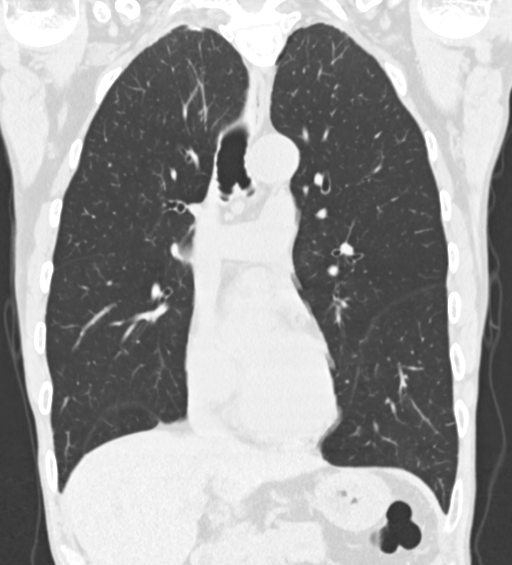
[im 91/151  lung]
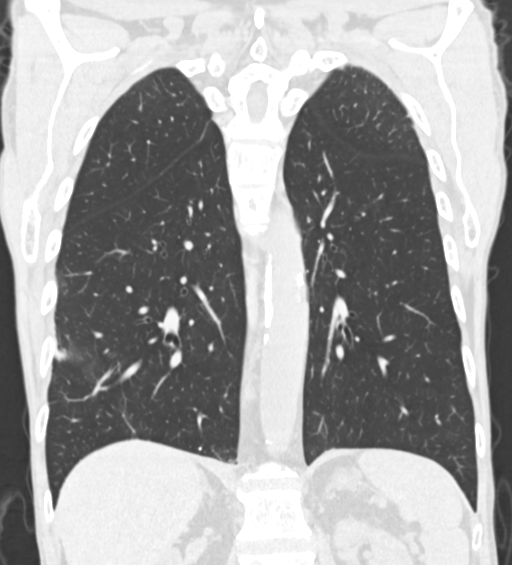

[14 of 36 positions shown; findings below may reference images not displayed]

FINDINGS: Cardiovascular: Heart size is normal. No pericardial effusion
identified. Mild aortic atherosclerosis. Ascending thoracic aortic
aneurysm is again noted measuring 4.5 cm, image 87/2. Calcification
in the RCA coronary artery noted.

Mediastinum/Nodes: Thyroid gland appears atrophic or surgically
absent. The trachea appears patent and is midline. Normal appearance
of the esophagus. No axillary, supraclavicular, or mediastinal
adenopathy.

Lungs/Pleura: No pleural effusion, airspace consolidation, or
atelectasis. Scarring is identified within the right middle lobe and
anterior lateral right lung base. Mild changes of centrilobular
emphysema. Diffuse bronchial wall thickening noted. Tiny nodule
within the subpleural right lower lobe is unchanged measuring 2 mm
no new or enlarging suspicious lung nodules identified.

Upper Abdomen: No acute abnormality. Low-density nodule in the left
adrenal gland is unchanged measuring 8 mm and is favored to
represent a benign adenoma. Small hiatal hernia identified.

Musculoskeletal: No chest wall mass or suspicious bone lesions
identified.
IMPRESSION: 1. Stable ascending thoracic aortic aneurysm. Recommend continued
semi-annual imaging followup by CTA or MRA and referral to
cardiothoracic surgery if not already obtained. This recommendation
follows 7787 ACCF/AHA/AATS/ACR/ASA/SCA/ISAI/MAMETJA/MALY/NONNENMACHER Guidelines
for the Diagnosis and Management of Patients With Thoracic Aortic
Disease. Circulation. 7787; 121: E266-e369. Aortic aneurysm NOS
(T985X-OKI.M)
2. Diffuse bronchial wall thickening with emphysema, as above;
imaging findings suggestive of underlying COPD.
3. Coronary artery calcifications noted.
4. Stable left adrenal gland adenoma.
5. Emphysema and aortic atherosclerosis.

Aortic Atherosclerosis (T985X-023.3) and Emphysema (T985X-ZTJ.O).

## 2021-06-14 ENCOUNTER — Encounter: Payer: Self-pay | Admitting: Gastroenterology

## 2021-07-14 ENCOUNTER — Other Ambulatory Visit: Payer: Self-pay | Admitting: Family Medicine

## 2021-07-14 DIAGNOSIS — N529 Male erectile dysfunction, unspecified: Secondary | ICD-10-CM

## 2021-07-14 NOTE — Telephone Encounter (Signed)
Patient is requesting a refill of the following medications: ?Requested Prescriptions  ? ?Pending Prescriptions Disp Refills  ? tadalafil (CIALIS) 10 MG tablet [Pharmacy Med Name: Tadalafil 10 MG Oral Tablet] 15 tablet 0  ?  Sig: TAKE 1/2 TO 1 (ONE-HALF TO ONE) TABLET BY MOUTH AS NEEDED  ? ? ?Date of patient request: 07/14/2021 ?Last office visit: 02/04/2021 ?Date of last refill: 06/03/2020 ?Last refill amount: 15 tablets 5 refills ?Follow up time period per chart:  ? ? ?.  ?

## 2021-07-15 ENCOUNTER — Encounter: Payer: Self-pay | Admitting: Family Medicine

## 2021-07-15 NOTE — Telephone Encounter (Signed)
Discussed in October, refill ordered. °

## 2021-08-05 ENCOUNTER — Ambulatory Visit: Payer: Medicare HMO | Admitting: Family Medicine

## 2021-08-24 ENCOUNTER — Ambulatory Visit (INDEPENDENT_AMBULATORY_CARE_PROVIDER_SITE_OTHER): Payer: Medicare HMO | Admitting: Family Medicine

## 2021-08-24 VITALS — BP 134/72 | HR 89 | Temp 98.1°F | Resp 16 | Ht 74.0 in | Wt 140.0 lb

## 2021-08-24 DIAGNOSIS — Z7901 Long term (current) use of anticoagulants: Secondary | ICD-10-CM

## 2021-08-24 DIAGNOSIS — I10 Essential (primary) hypertension: Secondary | ICD-10-CM | POA: Diagnosis not present

## 2021-08-24 DIAGNOSIS — E039 Hypothyroidism, unspecified: Secondary | ICD-10-CM

## 2021-08-24 DIAGNOSIS — D751 Secondary polycythemia: Secondary | ICD-10-CM | POA: Diagnosis not present

## 2021-08-24 DIAGNOSIS — I7121 Aneurysm of the ascending aorta, without rupture: Secondary | ICD-10-CM

## 2021-08-24 DIAGNOSIS — F418 Other specified anxiety disorders: Secondary | ICD-10-CM

## 2021-08-24 DIAGNOSIS — Z789 Other specified health status: Secondary | ICD-10-CM | POA: Diagnosis not present

## 2021-08-24 DIAGNOSIS — F1090 Alcohol use, unspecified, uncomplicated: Secondary | ICD-10-CM

## 2021-08-24 DIAGNOSIS — I2694 Multiple subsegmental pulmonary emboli without acute cor pulmonale: Secondary | ICD-10-CM

## 2021-08-24 LAB — COMPREHENSIVE METABOLIC PANEL
ALT: 14 U/L (ref 0–53)
AST: 22 U/L (ref 0–37)
Albumin: 4.3 g/dL (ref 3.5–5.2)
Alkaline Phosphatase: 58 U/L (ref 39–117)
BUN: 11 mg/dL (ref 6–23)
CO2: 31 mEq/L (ref 19–32)
Calcium: 9.4 mg/dL (ref 8.4–10.5)
Chloride: 100 mEq/L (ref 96–112)
Creatinine, Ser: 0.83 mg/dL (ref 0.40–1.50)
GFR: 86.8 mL/min (ref >60.00)
Glucose, Bld: 96 mg/dL (ref 70–99)
Potassium: 4.5 mEq/L (ref 3.5–5.1)
Sodium: 139 mEq/L (ref 135–145)
Total Bilirubin: 1.5 mg/dL — ABNORMAL HIGH (ref 0.2–1.2)
Total Protein: 7.2 g/dL (ref 6.0–8.3)

## 2021-08-24 LAB — CBC
HCT: 48.3 % (ref 39.0–52.0)
Hemoglobin: 16.1 g/dL (ref 13.0–17.0)
MCHC: 33.3 g/dL (ref 30.0–36.0)
MCV: 98.2 fl (ref 78.0–100.0)
Platelets: 194 10*3/uL (ref 150.0–400.0)
RBC: 4.92 Mil/uL (ref 4.22–5.81)
RDW: 13 % (ref 11.5–15.5)
WBC: 5.3 10*3/uL (ref 4.0–10.5)

## 2021-08-24 LAB — TSH: TSH: 2.39 u[IU]/mL (ref 0.35–5.50)

## 2021-08-24 MED ORDER — RIVAROXABAN 20 MG PO TABS: ORAL_TABLET | ORAL | 2 refills | Status: DC

## 2021-08-24 MED ORDER — LEVOTHYROXINE SODIUM 100 MCG PO TABS: 100.0000 ug | ORAL_TABLET | Freq: Every day | ORAL | 2 refills | Status: DC

## 2021-08-24 MED ORDER — AMLODIPINE BESYLATE 5 MG PO TABS: 5.0000 mg | ORAL_TABLET | Freq: Every day | ORAL | 2 refills | Status: DC

## 2021-08-24 NOTE — Progress Notes (Signed)
? ?Subjective:  ?Patient ID: Tyler Randall, male    DOB: [birth year]  Age: 76 y.o. MRN: [id number] ? ?CC:  ?Chief Complaint  ?Patient presents with  ? Hypothyroidism  ?  Pt doing well, no concerns due for recheck   ? Hypertension  ?  Pt here for recheck, notes he was walking last week notes he felt unwell on walk and his hands were red and sweaty, note this was at an old combat site, otherwise is well notes has not happened since   ? ? ?HPI ?Tyler Randall presents for  ? ?Hypothyroidism: ?Lab Results  ?Component Value Date  ? TSH 3.77 02/04/2021  ?Synthroid 100 mcg daily ?Taking medication daily.  ?No new hot or cold intolerance. No new hair or skin changes, heart palpitations or new fatigue. No new weight changes.  ? ?Hypercoagulable state with polycythemia, history of DVT/PE.   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 Irene Limbo.  Option of bone marrow biopsy if significant increase in hematocrit. ?Xarelto 20 mg daily.  No new bleeding. ?No CP/dyspnea.  ? ?Hypertension: ?As above, single episode of lightheadedness last week on long walk - [employer] reunion in [place]. On walking bridge. Fear of heights, and 3/4 way on bridge  - feeling anxious about eights, hands started to sweat, turned red, felt clammy. No syncope/cp/dyspnea. Some sweating. Improved after calming and then getting over bridge - 30 mins. No recurrence. No CP or other new sx's with activity.  ?Amlodipine 5 mg daily ?Some previous heavier alcohol intake that had decreased to 2-4 beers per day at his last visit in October 2022.  Has increased to 5-6 per days - enjoys in retirement. Denies addiction. No eye opener. No guilt. No agitation in discussion of feeling of need to cut back. Has decreased in past without difficulty. No DUI/legal issues.  ? ?Also with history of aortic aneurysm followed by vascular specialist, DR. Atkins, stable on 12/02/2020 chest CT with semiannual imaging  planned. ?Home readings:none.  ?BP Readings from Last 3 Encounters:  ?08/24/21 134/72  ?02/04/21 138/66  ?12/02/20 137/83  ? ?Lab Results  ?Component Value Date  ? CREATININE 0.79 02/04/2021  ? ? ? ?History ?Patient Active Problem List  ? Diagnosis Date Noted  ? Polycythemia vera ([place]) 03/31/2019  ? Deep vein thrombosis (DVT) of proximal vein of left lower extremity ([place]) 03/02/2019  ? Pulmonary embolus ([place]) 03/02/2019  ? Prostate cancer ([place]) 03/27/2014  ? Melanoma of skin, site unspecified 10/06/2013  ? Unspecified hypothyroidism 10/06/2013  ? Unspecified vitamin D deficiency 10/06/2013  ? Thyroid condition 11/28/2011  ? ?Past Medical History:  ?Diagnosis Date  ? Allergy   ? Depression   ? Hypothyroidism   ? Prostate cancer ([place])   ? Pulmonary embolism ([place])   ? Skin cancer   ? melanoma  ? Thyroid disease   ? hypothyroidism  ? ?Past Surgical History:  ?Procedure Laterality Date  ? COLONOSCOPY  2014  ? melanoma excision from back     ? POLYPECTOMY  2014  ? PROSTATE SURGERY    ? radiation seed implants    ? TONSILLECTOMY    ? ?Allergies  ?Allergen Reactions  ? Microplegia Msa-Msg [Cardioplegia [place] Formula] Hives and Swelling  ?  M.S.G. in food.  ? Selenium Gluconate [Selenium] Anaphylaxis and Hives  ? Latex   ?  With prolonged exposure  ? Sulfa Antibiotics   ?  Not sure reaction, started in infancy  ? ?  Prior to Admission medications   ?Medication Sig Start Date End Date Taking? Authorizing Provider  ?amLODipine (NORVASC) 5 MG tablet Take 1 tablet (5 mg total) by mouth daily. 02/04/21  Yes [name], MD  ?diphenhydrAMINE (BENADRYL) 50 MG tablet Take 50 mg by mouth at bedtime as needed for itching.   Yes [provider]  ?levothyroxine (SYNTHROID) 100 MCG tablet Take 1 tablet (100 mcg total) by mouth daily. 02/04/21  Yes [name], MD  ?Multiple Vitamins-Minerals (CENTRUM SILVER PO) Take 1 tablet by mouth.   Yes [provider]  ?rivaroxaban (XARELTO) 20 MG TABS tablet TAKE 1  TABLET (20 MG TOTAL) BY MOUTH DAILY WITH SUPPER. 02/04/21  Yes [name], MD  ?tadalafil (CIALIS) 10 MG tablet TAKE 1/2 TO 1 (ONE-HALF TO ONE) TABLET BY MOUTH AS NEEDED 07/15/21  Yes [name], MD  ? ?Social History  ? ?Socioeconomic History  ? Marital status: Divorced  ?  Spouse name: Not on file  ? Number of children: Not on file  ? Years of education: Not on file  ? Highest education level: Not on file  ?Occupational History  ? Not on file  ?Tobacco Use  ? Smoking status: Former  ?  Packs/day: 3.50  ?  Years: 50.00  ?  Pack years: 175.00  ?  Types: Cigarettes  ?  Start date: 51  ?  Quit date: 02/03/2016  ?  Years since quitting: 5.5  ? Smokeless tobacco: Never  ? Tobacco comments:  ?  cut down to 1 pack when quit in 2017  ?Vaping Use  ? Vaping Use: Former  ?Substance and Sexual Activity  ? Alcohol use: Yes  ?  Alcohol/week: 20.0 standard drinks  ?  Types: 20 Cans of beer per week  ? Drug use: No  ? Sexual activity: Yes  ?Other Topics Concern  ? Not on file  ?Social History Narrative  ? Not on file  ? ?Social Determinants of Health  ? ?Financial Resource Strain: Not on file  ?Food Insecurity: Not on file  ?Transportation Needs: Not on file  ?Physical Activity: Not on file  ?Stress: Not on file  ?Social Connections: Not on file  ?Intimate Partner Violence: Not on file  ? ? ?Review of Systems  ?Constitutional:  Negative for fatigue and unexpected weight change.  ?Eyes:  Negative for visual disturbance.  ?Respiratory:  Negative for cough, chest tightness and shortness of breath.   ?Cardiovascular:  Negative for chest pain, palpitations and leg swelling.  ?Gastrointestinal:  Negative for abdominal pain and blood in stool.  ?Neurological:  Negative for dizziness, light-headedness and headaches.  ? ? ?Objective:  ? ?Vitals:  ? 08/24/21 1115  ?BP: 134/72  ?Pulse: 89  ?Resp: 16  ?Temp: 98.1 ?F (36.7 ?C)  ?TempSrc: Temporal  ?SpO2: 97%  ?Weight: 140 lb (63.5 kg)  ?Height: 6' 2"  (1.88 m)  ? ? ? ?Physical  Exam ?Vitals reviewed.  ?Constitutional:   ?   Appearance: He is well-developed.  ?HENT:  ?   Head: Normocephalic and atraumatic.  ?Neck:  ?   Vascular: No carotid bruit or JVD.  ?Cardiovascular:  ?   Rate and Rhythm: Normal rate and regular rhythm.  ?   Heart sounds: Normal heart sounds. No murmur heard. ?Pulmonary:  ?   Effort: Pulmonary effort is normal.  ?   Breath sounds: Normal breath sounds. No rales.  ?Musculoskeletal:  ?   Right lower leg: No edema.  ?  Left lower leg: No edema.  ?Skin: ?   General: Skin is warm and dry.  ?Neurological:  ?   Mental Status: He is alert and oriented to person, place, and time.  ?Psychiatric:     ?   Mood and Affect: Mood normal.  ? ? ? ? ? ?Assessment & Plan:  ?Tyler Randall is a 76 y.o. male . ?Hypothyroidism, unspecified type - Plan: TSH, levothyroxine (SYNTHROID) 100 MCG tablet ? -Tolerating current dose Synthroid, check labs. ? ?Essential hypertension - Plan: Comprehensive metabolic panel, amLODipine (NORVASC) 5 MG tablet ? -Stable, continue amlodipine, labs pending ? ?Polycythemia, secondary - Plan: CBC ? -Updated CBC ordered, hematology follow-up if needed. ? ?Chronic anticoagulation ? -Denies any new bleeding, continue same dose Xarelto. ? ?Aneurysm of ascending aorta without rupture ([place]) ? -Stable on last testing, continue follow-up with vascular. ? ?Situational anxiety ? -Likely situational anxiety with fear of heights, with symptom resolution, no recurrence outside of that situation.  RTC precautions if recurrence of those symptoms.  Trigger avoidance. ? ?Alcohol use - Plan: Comprehensive metabolic panel ? -Decreased use recommended. ? ?Multiple subsegmental pulmonary emboli without acute cor pulmonale (HCC) - Plan: rivaroxaban (XARELTO) 20 MG TABS tablet ? -As above tolerating Xarelto, no signs or symptoms of new DVT/PE. ? ?No orders of the defined types were placed in this encounter. ? ?Patient Instructions  ?Episode on bridge likely panic/anxiety due to  heights. If any return of symptoms be seen right away.  ?No med changes today.  ?Try to cut back on alcohol, no more than 1-2 drinks per day.  Let me know if you have difficulty in cutting back.  If there are concerns

## 2021-08-24 NOTE — Patient Instructions (Addendum)
Episode on bridge likely panic/anxiety due to heights. If any return of symptoms be seen right away.  ?No med changes today.  ?Try to cut back on alcohol, no more than 1-2 drinks per day.  Let me know if you have difficulty in cutting back.  If there are concerns on your labs I will let you know.  Take care.  ? ? ? ? ?

## 2021-09-17 ENCOUNTER — Other Ambulatory Visit: Payer: Self-pay | Admitting: Family Medicine

## 2021-09-17 DIAGNOSIS — N529 Male erectile dysfunction, unspecified: Secondary | ICD-10-CM

## 2021-09-19 ENCOUNTER — Telehealth: Payer: Self-pay | Admitting: Family Medicine

## 2021-09-19 NOTE — Telephone Encounter (Signed)
PT stats that he needs a refill on his medication tadalafil 10 mg. Send request to [facility] on [address]. Pt stats that he leaving to go out of town tomorrow.

## 2021-10-10 ENCOUNTER — Other Ambulatory Visit: Payer: Self-pay | Admitting: *Deleted

## 2021-10-10 DIAGNOSIS — I7121 Aneurysm of the ascending aorta, without rupture: Secondary | ICD-10-CM

## 2021-11-08 ENCOUNTER — Telehealth: Payer: Self-pay | Admitting: Family Medicine

## 2021-11-08 NOTE — Telephone Encounter (Signed)
Should not be a problem to refer him to Dr. Dema Severin, but let's schedule an acute visit to see if there are some treatments I can help with initially and decide on timing of seeing the surgeon. First acute available is fine.

## 2021-11-08 NOTE — Telephone Encounter (Signed)
Caller name: Tyler Randall (pt)  On DPR? :yes/no: Yes  Call back number: [phone]  Provider they see: Dr. Carlota Raspberry  Reason for call: Pt states that he is having problems with anal bleeding and would like to see a proctologist, but needs a referral from Dr. Carlota Raspberry.  Pt would like to see Dr . Nadeen Landau @ [facility] [phone].

## 2021-11-08 NOTE — Telephone Encounter (Signed)
Spoke with Tyler Randall; advised of Dr. Vonna Kotyk note. Was able to schedule him for 1:20 11/10/21

## 2021-11-09 ENCOUNTER — Encounter: Payer: Self-pay | Admitting: Family Medicine

## 2021-11-09 ENCOUNTER — Ambulatory Visit (INDEPENDENT_AMBULATORY_CARE_PROVIDER_SITE_OTHER): Payer: Medicare HMO | Admitting: Family Medicine

## 2021-11-09 VITALS — BP 132/70 | HR 85 | Temp 98.3°F | Resp 19 | Ht 74.0 in | Wt 138.4 lb

## 2021-11-09 DIAGNOSIS — Z8719 Personal history of other diseases of the digestive system: Secondary | ICD-10-CM | POA: Diagnosis not present

## 2021-11-09 DIAGNOSIS — L290 Pruritus ani: Secondary | ICD-10-CM

## 2021-11-09 DIAGNOSIS — K625 Hemorrhage of anus and rectum: Secondary | ICD-10-CM

## 2021-11-09 LAB — CBC
HCT: 48.7 % (ref 39.0–52.0)
Hemoglobin: 16.3 g/dL (ref 13.0–17.0)
MCHC: 33.4 g/dL (ref 30.0–36.0)
MCV: 96.4 fl (ref 78.0–100.0)
Platelets: 187 10*3/uL (ref 150.0–400.0)
RBC: 5.05 Mil/uL (ref 4.22–5.81)
RDW: 13.1 % (ref 11.5–15.5)
WBC: 5.6 10*3/uL (ref 4.0–10.5)

## 2021-11-09 MED ORDER — HYDROCORTISONE (PERIANAL) 2.5 % EX CREA: 1.0000 | TOPICAL_CREAM | Freq: Two times a day (BID) | CUTANEOUS | 0 refills | Status: DC

## 2021-11-09 NOTE — Patient Instructions (Addendum)
I would like Dr. Loletha Carrow to initially see you as you had prior mucosal abnormality and polyps in the rectum and due for colonoscopy.  Anusol HC for now for itching, possible hemorrhoids.  Return to the clinic or go to the nearest emergency room if any of your symptoms worsen or new symptoms occur.  Rectal Bleeding  Rectal bleeding is when blood passes out of the opening between the buttocks (anus). People with rectal bleeding may notice bright red blood in their underwear or in the toilet after having a bowel movement. They may also have blood mixed with their stool (feces), or dark red or black stools. Rectal bleeding is usually a sign that something is wrong. Many things can cause rectal bleeding, including: Diverticulosis. This is a condition in which pockets or sacs project from the bowel. Hemorrhoids. These are blood vessels around the anus or inside the rectum that are larger than normal. Anal fissures. This is a tear in the anus. Proctitis and colitis. These are conditions in which the rectum, colon, or anus become inflamed. Polyps. These are growths that can be cancerous (malignant) or noncancerous (benign). Infections of the intestines. Fistulas. These are abnormal openings in the rectum and anus. Rectal prolapse. This is when a part of the rectum sticks out from the anus. Follow these instructions at home: Pay attention to any changes in your symptoms. Take these actions to help reduce bleeding and discomfort: Medicines Take over-the-counter and prescription medicines only as told by your health care provider. Ask your health care provider about changing or stopping your regular medicines or supplements. This is especially important if you are taking blood thinners. Medicines that thin the blood can make rectal bleeding worse. Managing constipation Your condition may cause constipation. To prevent or treat constipation, or to help make your stools soft, you may need to: Drink enough  fluid to keep your urine pale yellow. Take over-the-counter or prescription medicines. Eat foods that are high in fiber, such as beans, whole grains, and fresh fruits and vegetables. Ask your health care provider if you need a supplement to give you more fiber. Limit foods that are high in fat and processed sugars, such as fried or sweet foods.  General instructions Try not to strain when having a bowel movement. Try taking a warm bath. This may help to soothe any pain in your rectum. Keep all follow-up visits as told by your health care provider. This is important. Contact a health care provider if you: Have pain or tenderness in your abdomen. Have a fever. Have weakness. Have nausea. Cannot have a bowel movement. Get help right away if you have: New or increased rectal bleeding. Black or dark red stools. Vomit with blood or something that looks like coffee grounds. A fainting episode. Severe pain in your rectum. Summary Rectal bleeding is usually a sign that something is wrong. This condition should be evaluated by a health care provider. Eat a diet that is high in fiber. This will help keep your stools soft, making it easier to pass stools without straining. Medicines that thin the blood can make rectal bleeding worse. Get help right away if you have new or increased rectal bleeding, black or dark red stools, blood in your vomit, an episode of fainting, or severe pain in your rectum. This information is not intended to replace advice given to you by your health care provider. Make sure you discuss any questions you have with your health care provider. Document Revised: 02/26/2019 Document Reviewed:  02/26/2019 Elsevier Patient Education  [place].

## 2021-11-09 NOTE — Progress Notes (Signed)
Subjective:  Patient ID: Tyler Randall, male    DOB: January 06, 1946  Age: 76 y.o. MRN: [id number]  CC:  Chief Complaint  Patient presents with   Hemorrhoids    Pt states bleeding a little started in last 3 months and just got more irritated, hemorrhoids witch hazel wipes (preparation H)    HPI Tyler Randall presents for   Hemorrhoids/bright red blood per rectum: Noted past 3 months. Initially mild irritation at anus, then some blood with wiping  2 bumps in anal area noted with wiping 3-4 weeks ago. Occasional BRBPR.  No change in stools.  No new physical activity, straining. No long distance driving recently. Prior [place] in May. Driving to [place] in few weeks.  Denies constipation, hard stools, or straining for BM. BM 1-2x/day. No anal pain with BM, no known tear/fissure.  Tx: preparation H wipes daily. Improves itching and some of the soreness.  Feeling like needs to have a BM, no pain with BM.  No abd pain, fever, night sweats, weigh loss.   Colonoscopy 2019 with Dr. Loletha Carrow,  Patient plans on repeat colonoscopy in December.   Prior report: Diverticulosis in the left colon. - One 3 mm polyp in the sigmoid colon, removed with a cold snare. Resected and retrieved. adenomatous polyp  - One 12-15 mm mucosal abnormality in the rectum, removed piecemeal using a cold biopsy forceps. Resected and retrieved. - The examination was otherwise normal  April 2021 sigmoidoscopy: One 20 mm area of polyp tissue in the distal rectum, removed piecemeal using a cold biopsy forceps. Resected and retrieved.  Tubular adenoma.  Repeat colonoscopy in 1 year recommended.  History Patient Active Problem List   Diagnosis Date Noted   Polycythemia vera ([facility]) 03/31/2019   Deep vein thrombosis (DVT) of proximal vein of left lower extremity ([facility]) 03/02/2019   Pulmonary embolus ([facility]) 03/02/2019   Prostate cancer ([facility]) 03/27/2014   Melanoma of skin, site unspecified 10/06/2013   Unspecified  hypothyroidism 10/06/2013   Unspecified vitamin D deficiency 10/06/2013   Thyroid condition 11/28/2011   Past Medical History:  Diagnosis Date   Allergy    Depression    Hypothyroidism    Prostate cancer ([facility])    Pulmonary embolism ([facility])    Skin cancer    melanoma   Thyroid disease    hypothyroidism   Past Surgical History:  Procedure Laterality Date   COLONOSCOPY  2014   melanoma excision from back      POLYPECTOMY  2014   PROSTATE SURGERY     radiation seed implants     TONSILLECTOMY     Allergies  Allergen Reactions   Microplegia Msa-Msg [Cardioplegia Del Nido Formula] Hives and Swelling    M.S.G. in food.   Selenium Gluconate [Selenium] Anaphylaxis and Hives   Latex     With prolonged exposure   Sulfa Antibiotics     Not sure reaction, started in infancy   Prior to Admission medications   Medication Sig Start Date End Date Taking? Authorizing Provider  amLODipine (NORVASC) 5 MG tablet Take 1 tablet (5 mg total) by mouth daily. 08/24/21  Yes [name], MD  diphenhydrAMINE (BENADRYL) 50 MG tablet Take 50 mg by mouth at bedtime as needed for itching.   Yes [provider]  levothyroxine (SYNTHROID) 100 MCG tablet Take 1 tablet (100 mcg total) by mouth daily. 08/24/21  Yes [name], MD  Multiple Vitamins-Minerals (CENTRUM SILVER PO) Take 1 tablet by mouth.   Yes  [provider]  rivaroxaban (XARELTO) 20 MG TABS tablet TAKE 1 TABLET (20 MG TOTAL) BY MOUTH DAILY WITH SUPPER. 08/24/21  Yes [name], MD  tadalafil (CIALIS) 10 MG tablet TAKE 1/2 (ONE-HALF) TO 1 TABLET BY ONCE DAILY AS NEEDED 09/19/21  Yes [name], MD   Social History   Socioeconomic History   Marital status: Divorced    Spouse name: Not on file   Number of children: Not on file   Years of education: Not on file   Highest education level: Not on file  Occupational History   Not on file  Tobacco Use   Smoking status: Former    Packs/day: 3.50     Years: 50.00    Total pack years: 175.00    Types: Cigarettes    Start date: 47    Quit date: 02/03/2016    Years since quitting: 5.7   Smokeless tobacco: Never   Tobacco comments:    cut down to 1 pack when quit in 2017  Vaping Use   Vaping Use: Former  Substance and Sexual Activity   Alcohol use: Yes    Alcohol/week: 20.0 standard drinks of alcohol    Types: 20 Cans of beer per week   Drug use: No   Sexual activity: Yes  Other Topics Concern   Not on file  Social History Narrative   Not on file   Social Determinants of Health   Financial Resource Strain: Not on file  Food Insecurity: Not on file  Transportation Needs: Not on file  Physical Activity: Not on file  Stress: Not on file  Social Connections: Not on file  Intimate Partner Violence: Not on file    Review of Systems   Objective:   Vitals:   11/09/21 1307  BP: 132/70  Pulse: 85  Resp: 19  Temp: 98.3 F (36.8 C)  SpO2: 94%  Weight: 138 lb 6.4 oz (62.8 kg)  Height: '6\' 2"'$  (1.88 m)     Physical Exam Vitals reviewed.  Constitutional:      Appearance: He is well-developed.  HENT:     Head: Normocephalic and atraumatic.  Neck:     Vascular: No carotid bruit or JVD.  Cardiovascular:     Rate and Rhythm: Normal rate and regular rhythm.  Pulmonary:     Effort: Pulmonary effort is normal.     Breath sounds: No rales.  Abdominal:     General: Abdomen is flat. Bowel sounds are normal. There is no distension.     Tenderness: There is no abdominal tenderness.  Genitourinary:    Comments: Slight discomfort with palpation over the posterior aspect of the anus, but no fissures or apparent external hemorrhoids identified.  Few skin tags.  Normal tone, no bleeding noted. Possible soft mass/abnormality in rectal area at 6:00 on DRE.  Musculoskeletal:     Right lower leg: No edema.     Left lower leg: No edema.  Skin:    General: Skin is warm and dry.  Neurological:     Mental Status: He is alert and  oriented to person, place, and time.  Psychiatric:        Mood and Affect: Mood normal.      Assessment & Plan:  Tyler Randall is a 76 y.o. male . BRBPR (bright red blood per rectum) - Plan: Ambulatory referral to General Surgery, Ambulatory referral to Gastroenterology, CBC  Anal itching - Plan: Ambulatory referral to General Surgery, Ambulatory referral to Gastroenterology,  CBC, hydrocortisone (ANUSOL-HC) 2.5 % rectal cream  History of diverticulosis  History of diverticulosis, mucosal lesion on prior colonoscopy in 2019, adenomatous polyp.  Now with bright red blood per rectum, itching, symptoms likely hemorrhoidal but has not been constipated.  No external hemorrhoids identified, few skin tags noted.  Possible internal hemorrhoids, less likely fissure.  Given prior colonoscopy results recommend initial evaluation with his gastroenterologist and will refer to colorectal surgeon.  Anusol HC temporarily for itching.  Check CBC, RTC precautions given.  Meds ordered this encounter  Medications   hydrocortisone (ANUSOL-HC) 2.5 % rectal cream    Sig: Place 1 Application rectally 2 (two) times daily.    Dispense:  30 g    Refill:  0   Patient Instructions  I would like Dr. Loletha Carrow to see you as you had prior mucosal abnormality and polyps in the rectum on 2019 colonoscopy. I will also refer you to colorectal surgeon.  Anusol HC for now for itching, possible hemorrhoids.  Return to the clinic or go to the nearest emergency room if any of your symptoms worsen or new symptoms occur.  Rectal Bleeding  Rectal bleeding is when blood passes out of the opening between the buttocks (anus). People with rectal bleeding may notice bright red blood in their underwear or in the toilet after having a bowel movement. They may also have blood mixed with their stool (feces), or dark red or black stools. Rectal bleeding is usually a sign that something is wrong. Many things can cause rectal bleeding,  including: Diverticulosis. This is a condition in which pockets or sacs project from the bowel. Hemorrhoids. These are blood vessels around the anus or inside the rectum that are larger than normal. Anal fissures. This is a tear in the anus. Proctitis and colitis. These are conditions in which the rectum, colon, or anus become inflamed. Polyps. These are growths that can be cancerous (malignant) or noncancerous (benign). Infections of the intestines. Fistulas. These are abnormal openings in the rectum and anus. Rectal prolapse. This is when a part of the rectum sticks out from the anus. Follow these instructions at home: Pay attention to any changes in your symptoms. Take these actions to help reduce bleeding and discomfort: Medicines Take over-the-counter and prescription medicines only as told by your health care provider. Ask your health care provider about changing or stopping your regular medicines or supplements. This is especially important if you are taking blood thinners. Medicines that thin the blood can make rectal bleeding worse. Managing constipation Your condition may cause constipation. To prevent or treat constipation, or to help make your stools soft, you may need to: Drink enough fluid to keep your urine pale yellow. Take over-the-counter or prescription medicines. Eat foods that are high in fiber, such as beans, whole grains, and fresh fruits and vegetables. Ask your health care provider if you need a supplement to give you more fiber. Limit foods that are high in fat and processed sugars, such as fried or sweet foods.  General instructions Try not to strain when having a bowel movement. Try taking a warm bath. This may help to soothe any pain in your rectum. Keep all follow-up visits as told by your health care provider. This is important. Contact a health care provider if you: Have pain or tenderness in your abdomen. Have a fever. Have weakness. Have nausea. Cannot  have a bowel movement. Get help right away if you have: New or increased rectal bleeding. Black or dark red  stools. Vomit with blood or something that looks like coffee grounds. A fainting episode. Severe pain in your rectum. Summary Rectal bleeding is usually a sign that something is wrong. This condition should be evaluated by a health care provider. Eat a diet that is high in fiber. This will help keep your stools soft, making it easier to pass stools without straining. Medicines that thin the blood can make rectal bleeding worse. Get help right away if you have new or increased rectal bleeding, black or dark red stools, blood in your vomit, an episode of fainting, or severe pain in your rectum. This information is not intended to replace advice given to you by your health care provider. Make sure you discuss any questions you have with your health care provider. Document Revised: 02/26/2019 Document Reviewed: 02/26/2019 Elsevier Patient Education  2023 [place],   Merri Ray, MD [place] 11/09/21 2:04 PM

## 2021-11-10 ENCOUNTER — Encounter: Payer: Self-pay | Admitting: Family Medicine

## 2021-11-10 ENCOUNTER — Other Ambulatory Visit: Payer: Self-pay | Admitting: Family Medicine

## 2021-11-10 DIAGNOSIS — N529 Male erectile dysfunction, unspecified: Secondary | ICD-10-CM

## 2021-11-11 ENCOUNTER — Encounter: Payer: Self-pay | Admitting: Gastroenterology

## 2021-11-11 ENCOUNTER — Encounter: Payer: Self-pay | Admitting: Family Medicine

## 2021-11-11 NOTE — Telephone Encounter (Signed)
Pt wants to know if he should still pursue referral if having relief with cream?

## 2021-12-13 ENCOUNTER — Telehealth: Payer: Self-pay

## 2021-12-13 ENCOUNTER — Encounter: Payer: Self-pay | Admitting: Gastroenterology

## 2021-12-13 ENCOUNTER — Ambulatory Visit (INDEPENDENT_AMBULATORY_CARE_PROVIDER_SITE_OTHER): Payer: Medicare HMO | Admitting: Gastroenterology

## 2021-12-13 VITALS — BP 142/84 | HR 92 | Ht 72.25 in | Wt 140.0 lb

## 2021-12-13 DIAGNOSIS — Z86010 Personal history of colonic polyps: Secondary | ICD-10-CM | POA: Diagnosis not present

## 2021-12-13 DIAGNOSIS — K625 Hemorrhage of anus and rectum: Secondary | ICD-10-CM

## 2021-12-13 NOTE — Telephone Encounter (Signed)
[facility] Pre-operative Risk Assessment     Request for surgical clearance:     Endoscopy Procedure  What type of surgery is being performed?     Colonoscopy  When is this surgery scheduled?     01-24-2022   What type of clearance is required ?   Pharmacy  Are there any medications that need to be held prior to surgery and how long? Yes, Xarelto, 2 days  Practice name and name of physician performing surgery?      [facility]  What is your office phone and fax number?      Phone- [phone]  [phone]  Anesthesia type (None, local, MAC, general) ?       MAC

## 2021-12-13 NOTE — Progress Notes (Signed)
[facility] Consult Note:  History: Tyler Randall 12/13/2021  Referring provider: [name], MD  Reason for consult/chief complaint: Rectal Bleeding (BRB on the toilet paper x 2 months) and Anal Itching   Subjective  HPI: Epic is a 76 year old man known to me from previous colonoscopy discovering adenomatous polyps, he is referred back for rectal bleeding and perianal itching, last seen by primary care a few weeks ago. In October 2019 on routine colonoscopy, an area of flat adenomatous tissue adjacent to the dentate line was removed piecemeal with cold biopsy forceps. Similar finding in the same area on sigmoidoscopy April 2021, again removed piecemeal with cold biopsy forceps, pathology again showing tubular adenoma and subsequent recommendation was for a repeat colonoscopy in 1 year.  He was sent reminder letters in August 2022 and March 2023.  For about the last 2 months, Tyler Randall has noted some intermittent feelings of rectal pressure for BM, though bowel habits remain normal with formed stool twice daily on average.  He has noticed some occasional "seepage" smear of stool underpants and sometimes blood on the paper along with perianal itching.  Denies abdominal or rectal pain.  Appetite remains normal and weight stable.  Denies chest pain or dyspnea, and remains active.   ROS:  Review of Systems  Constitutional:  Negative for appetite change and unexpected weight change.  HENT:  Negative for mouth sores and voice change.   Eyes:  Negative for pain and redness.  Respiratory:  Negative for cough and shortness of breath.   Cardiovascular:  Negative for chest pain and palpitations.  Genitourinary:  Negative for dysuria and hematuria.  Musculoskeletal:  Negative for arthralgias and myalgias.  Skin:  Negative for pallor and rash.  Neurological:  Negative for weakness and headaches.  Hematological:  Negative for adenopathy.     Past Medical  History: Past Medical History:  Diagnosis Date   Allergy    Depression    Hypothyroidism    Melanoma ([facility])    Prostate cancer ([facility])    Pulmonary embolism ([facility])      Past Surgical History: Past Surgical History:  Procedure Laterality Date   COLONOSCOPY  2014   melanoma excision from back      POLYPECTOMY  2014   PROSTATE SURGERY     radiation seed implants     TONSILLECTOMY       Family History: Family History  Problem Relation Age of Onset   Pneumonia Mother    Liver cancer Father    Thyroid disease Sister    Thyroid disease Sister    Prostate cancer Brother    Thyroid disease Brother    Mental retardation Brother    Lung cancer Brother    Bone cancer Brother    Liver cancer Brother    Colon polyps Neg Hx    Colon cancer Neg Hx    Esophageal cancer Neg Hx    Rectal cancer Neg Hx    Stomach cancer Neg Hx     Social History: Social History   Socioeconomic History   Marital status: Divorced    Spouse name: Not on file   Number of children: 0   Years of education: Not on file   Highest education level: Not on file  Occupational History   Occupation: retired  Tobacco Use   Smoking status: Former    Packs/day: 3.50    Years: 50.00    Total pack years: 175.00    Types: Cigarettes  Start date: 30    Quit date: 02/03/2016    Years since quitting: 5.8   Smokeless tobacco: Never   Tobacco comments:    cut down to 1 pack when quit in 2017  Vaping Use   Vaping Use: Former  Substance and Sexual Activity   Alcohol use: Yes    Alcohol/week: 20.0 standard drinks of alcohol    Types: 20 Cans of beer per week    Comment: 4 per day   Drug use: No   Sexual activity: Yes  Other Topics Concern   Not on file  Social History Narrative   Not on file   Social Determinants of Health   Financial Resource Strain: Not on file  Food Insecurity: Not on file  Transportation Needs: Not on file  Physical Activity: Not on file  Stress: Not on file  Social  Connections: Not on file    Allergies: Allergies  Allergen Reactions   Microplegia Msa-Msg [Cardioplegia Del Nido Formula] Hives and Swelling    M.S.G. in food.   Selenium Gluconate [Selenium] Anaphylaxis and Hives   Latex     With prolonged exposure   Sulfa Antibiotics     Not sure reaction, started in infancy    Outpatient Meds: Current Outpatient Medications  Medication Sig Dispense Refill   amLODipine (NORVASC) 5 MG tablet Take 1 tablet (5 mg total) by mouth daily. 90 tablet 2   diphenhydrAMINE (BENADRYL) 50 MG tablet Take 50 mg by mouth at bedtime as needed for itching.     levothyroxine (SYNTHROID) 100 MCG tablet Take 1 tablet (100 mcg total) by mouth daily. 90 tablet 2   Multiple Vitamins-Minerals (CENTRUM SILVER PO) Take 1 tablet by mouth.     rivaroxaban (XARELTO) 20 MG TABS tablet TAKE 1 TABLET (20 MG TOTAL) BY MOUTH DAILY WITH SUPPER. 90 tablet 2   tadalafil (CIALIS) 10 MG tablet TAKE 1/2 TO 1 (ONE-HALF TO ONE) TABLET BY MOUTH ONCE DAILY AS NEEDED 15 tablet 0   hydrocortisone (ANUSOL-HC) 2.5 % rectal cream Place 1 Application rectally 2 (two) times daily. (Patient not taking: Reported on 12/13/2021) 30 g 0   No current facility-administered medications for this visit.      ___________________________________________________________________ Objective   Exam:  BP (!) 142/84 (BP Location: Left Arm, Patient Position: Sitting, Cuff Size: Normal)   Pulse 92   Ht 6' 0.25" (1.835 m) Comment: height measured without shoes  Wt 140 lb (63.5 kg)   BMI 18.86 kg/m  Wt Readings from Last 3 Encounters:  12/13/21 140 lb (63.5 kg)  11/09/21 138 lb 6.4 oz (62.8 kg)  08/24/21 140 lb (63.5 kg)    General: Well-appearing, thin as before Eyes: sclera anicteric, no redness ENT: oral mucosa moist without lesions, no cervical or supraclavicular lymphadenopathy CV: Regular without murmur, no JVD, no peripheral edema Resp: clear to auscultation bilaterally, normal RR and effort  noted GI: soft, no tenderness, with active bowel sounds. No guarding or palpable organomegaly noted. Skin; warm and dry, no rash or jaundice noted Neuro: awake, alert and oriented x 3. Normal gross motor function and fluent speech Rectal: Normal perianal exam, mildly reduced resting and voluntary sphincter tone, no fissure pain, tenderness or palpable internal lesion.  Specifically, no palpable lesion around the circumference of the anal verge. Labs:     Latest Ref Rng & Units 11/09/2021    2:22 PM 08/24/2021   11:54 AM 02/04/2021   10:08 AM  CBC  WBC 4.0 - 10.5 K/uL 5.6  5.3  4.9   Hemoglobin 13.0 - 17.0 g/dL 16.3  16.1  16.3   Hematocrit 39.0 - 52.0 % 48.7  48.3  49.5   Platelets 150.0 - 400.0 K/uL 187.0  194.0  211.0      Radiologic Studies:  LVEF normal on last echocardiogram August 2020  Assessment: Encounter Diagnoses  Name Primary?   Rectal bleeding Yes   History of colonic polyps     Recent anorectal symptoms that may be internal hemorrhoids.  Although he is overdue for surveillance of the flat distal rectal adenomatous polyp, there is no palpable abnormality or to suggest there has been interval development of malignancy.  Plan:  Colonoscopy.  He was agreeable after discussion of procedure and risks.  The benefits and risks of the planned procedure were described in detail with the patient or (when appropriate) their health care proxy.  Risks were outlined as including, but not limited to, bleeding, infection, perforation, adverse medication reaction leading to cardiac or pulmonary decompensation, pancreatitis (if ERCP).  The limitation of incomplete mucosal visualization was also discussed.  No guarantees or warranties were given.  As before, he is aware of the small but real risk of blood clot during the brief hold of his Xarelto (2 days prior).  We will clear this with his primary care provider.  Thank you for the courtesy of this consult.  Please call me with any  questions or concerns.  Nelida Meuse III  CC: Referring provider noted above

## 2021-12-13 NOTE — Patient Instructions (Signed)
_______________________________________________________  If you are age 74 or older, your body mass index should be between 23-30. Your Body mass index is 18.86 kg/m. If this is out of the aforementioned range listed, please consider follow up with your Primary Care Provider.  If you are age 26 or younger, your body mass index should be between 19-25. Your Body mass index is 18.86 kg/m. If this is out of the aformentioned range listed, please consider follow up with your Primary Care Provider.   ________________________________________________________  The [facility] providers would like to encourage you to use [facility] to communicate with providers for non-urgent requests or questions.  Due to long hold times on the telephone, sending your provider a message by [facility] may be a faster and more efficient way to get a response.  Please allow 48 business hours for a response.  Please remember that this is for non-urgent requests.  _______________________________________________________  Dennis Bast have been scheduled for a colonoscopy. Please follow written instructions given to you at your visit today.  Please pick up your prep supplies at the pharmacy within the next 1-3 days. If you use inhalers (even only as needed), please bring them with you on the day of your procedure.  Due to recent changes in healthcare laws, you may see the results of your imaging and laboratory studies on MyChart before your provider has had a chance to review them.  We understand that in some cases there may be results that are confusing or concerning to you. Not all laboratory results come back in the same time frame and the provider may be waiting for multiple results in order to interpret others.  Please give [place] 48 hours in order for your provider to thoroughly review all the results before contacting the office for clarification of your results.    It was a pleasure to see you today!  Thank you for trusting me with your  gastrointestinal care!

## 2021-12-13 NOTE — Telephone Encounter (Signed)
Will forward to Dr. Irene Limbo for recommendations on holding anticoag temporarily with hx of PE.

## 2021-12-14 ENCOUNTER — Encounter: Payer: Self-pay | Admitting: Surgery

## 2021-12-14 ENCOUNTER — Ambulatory Visit (INDEPENDENT_AMBULATORY_CARE_PROVIDER_SITE_OTHER): Payer: Medicare HMO | Admitting: Surgery

## 2021-12-14 ENCOUNTER — Ambulatory Visit
Admission: RE | Admit: 2021-12-14 | Discharge: 2021-12-14 | Disposition: A | Discharge status: Home / Self Care | Payer: Medicare HMO | Source: Ambulatory Visit | Attending: Surgery | Admitting: Surgery

## 2021-12-14 VITALS — BP 149/87 | HR 73 | Resp 18 | Ht 72.0 in | Wt 140.0 lb

## 2021-12-14 DIAGNOSIS — I7121 Aneurysm of the ascending aorta, without rupture: Secondary | ICD-10-CM | POA: Diagnosis not present

## 2021-12-14 MED ORDER — IOPAMIDOL (ISOVUE-370) INJECTION 76%
75.0000 mL | Freq: Once | INTRAVENOUS | Status: AC | PRN
Start: 2021-12-14 — End: 2021-12-14
  Administered 2021-12-14: 75 mL via INTRAVENOUS

## 2021-12-14 NOTE — Progress Notes (Signed)
HPI:  The patient is a 76 year old gentleman with a history of hypertension who has been followed in our office since 11/2018 for a 4.6 cm fusiform ascending aortic aneurysm.  He saw Dr. Orvan Seen initially and saw one of our PAs 1 year ago.  He continues to feel well and remains physically active.  He denies any chest pain or pressure.  He does not check his blood pressure at home.  Current Outpatient Medications  Medication Sig Dispense Refill   amLODipine (NORVASC) 5 MG tablet Take 1 tablet (5 mg total) by mouth daily. 90 tablet 2   diphenhydrAMINE (BENADRYL) 50 MG tablet Take 50 mg by mouth at bedtime as needed for itching.     hydrocortisone (ANUSOL-HC) 2.5 % rectal cream Place 1 Application rectally 2 (two) times daily. 30 g 0   levothyroxine (SYNTHROID) 100 MCG tablet Take 1 tablet (100 mcg total) by mouth daily. 90 tablet 2   Multiple Vitamins-Minerals (CENTRUM SILVER PO) Take 1 tablet by mouth.     rivaroxaban (XARELTO) 20 MG TABS tablet TAKE 1 TABLET (20 MG TOTAL) BY MOUTH DAILY WITH SUPPER. 90 tablet 2   tadalafil (CIALIS) 10 MG tablet TAKE 1/2 TO 1 (ONE-HALF TO ONE) TABLET BY MOUTH ONCE DAILY AS NEEDED 15 tablet 0   No current facility-administered medications for this visit.     Physical Exam: BP (!) 149/87 (BP Location: Right Arm, Patient Position: Sitting)   Pulse 73   Resp 18   Ht 6' (1.829 m)   Wt 140 lb (63.5 kg)   SpO2 97% Comment: RA  BMI 18.99 kg/m  He looks well. Cardiac exam shows regular rate and rhythm with normal heart sounds.  There is no murmur. Lungs are clear.  Diagnostic Tests:  Narrative & Impression  CLINICAL DATA:  A 76 year old male presents for evaluation of aortic aneurysm, follow-up.   EXAM: CT ANGIOGRAPHY CHEST WITH CONTRAST   TECHNIQUE: Multidetector CT imaging of the chest was performed using the standard protocol during bolus administration of intravenous contrast. Multiplanar CT image reconstructions and MIPs were obtained to  evaluate the vascular anatomy.   RADIATION DOSE REDUCTION: This exam was performed according to the departmental dose-optimization program which includes automated exposure control, adjustment of the mA and/or kV according to patient size and/or use of iterative reconstruction technique.   CONTRAST:  52m ISOVUE-370 IOPAMIDOL (ISOVUE-370) INJECTION 76%   COMPARISON:  December 02, 2020   FINDINGS: Cardiovascular: Ascending thoracic aorta at 4.4 x 4.6 cm axial dimension stable since August of 2022. Descending thoracic aorta also stable at 25 mm. Aortic atherosclerotic plaque both calcified and noncalcified. Three-vessel branching pattern in the chest with patent branch vessels to the extent evaluated. This is also unchanged.   Heart size is normal without pericardial effusion or sign of pericardial nodularity. Central pulmonary vasculature not well assessed due to phase of contrast enhancement.   Mediastinum/Nodes: No adenopathy in the chest.   Lungs/Pleura: Pleural and parenchymal scarring in the chest. No signs of consolidation or pleural effusion. Airways are patent.   Upper Abdomen: Smooth hepatic contours. Liver incompletely imaged. No acute findings relative to imaged portions of pancreas, spleen, adrenal glands and kidneys. No acute gastrointestinal process on limited assessment.   Musculoskeletal: No acute musculoskeletal findings. Spinal degenerative changes.   Review of the MIP images confirms the above findings.   IMPRESSION: 1. Stable ascending thoracic aorta at 4.4 x 4.6 cm axial dimension stable since August of 2022. Ascending thoracic aortic aneurysm. Recommend  semi-annual imaging followup by CTA or MRA and referral to cardiothoracic surgery if not already obtained. This recommendation follows 2010 ACCF/AHA/AATS/ACR/ASA/SCA/SCAI/SIR/STS/SVM Guidelines for the Diagnosis and Management of Patients With Thoracic Aortic Disease. Circulation. 2010; 121: C947-S962.  Aortic aneurysm NOS (ICD10-I71.9) 2. Aortic atherosclerosis both calcified and noncalcified.   Aortic Atherosclerosis (ICD10-I70.0).     Electronically Signed   By: Zetta Bills M.D.   On: 12/14/2021 08:48      Impression:  This 76 year old gentleman has a stable 4.6 cm fusiform ascending aortic aneurysm which has not changed dating back to 11/2018.  This is well below the surgical threshold of 5.5 cm.  An echocardiogram in 2020 showed a normal trileaflet aortic valve with trivial insufficiency.  I reviewed the CT images with him and answered his questions.  I stressed the importance of continued good blood pressure control in preventing further enlargement and acute aortic dissection.  His blood pressure is mildly elevated today.  He does not check his blood pressure at home.  It was checked in his gastroenterology office yesterday and was 142/84.  He is on Norvasc 5 mg daily.  He may benefit from increased blood pressure medication but I will leave that decision up to Dr. Carlota Raspberry.  I told him that ideally we would like to keep his blood pressure 130/80 or less.  I also advised him to get an automated blood pressure cuff so that he can keep an eye on his blood pressure at home.  While his risk of aortic dissection is much less than 1%, it is still not zero and certainly increased by significant hypertension.  Plan:  He will continue to follow-up with his PCP, Dr. Carlota Raspberry, and I will plan to see him back in 1 year with a CTA of the chest.  I spent 20 minutes performing this established patient evaluation and > 50% of this time was spent face to face counseling and coordinating the care of this patient's aortic aneurysm.    Gaye Pollack, MD [facility] [phone]

## 2022-01-05 ENCOUNTER — Telehealth: Payer: Self-pay | Admitting: Family Medicine

## 2022-01-05 NOTE — Telephone Encounter (Signed)
Beth states that released this patient back to PCP Dr Carlota Raspberry.  Dr Irene Limbo will not be giving clearance as they haven't seen him since 2021

## 2022-01-05 NOTE — Telephone Encounter (Signed)
Beth called wanting to speak with you in regards to patient. Requesting a call back at her direct line:  [phone]   Please advise.

## 2022-01-05 NOTE — Telephone Encounter (Signed)
I have spoken to Levada Dy at Dr Vonna Kotyk office to request clearance due to patient being released from Dr Irene Limbo.

## 2022-01-05 NOTE — Telephone Encounter (Signed)
Caller name: Vivien Rota @  [facility]   On DPR? :yes/no: No  Call back number: [phone]  Provider they see: Carlota Raspberry  Reason for call: calling to get surgical clearance to hold xarelto prior to procedure; needs to come from Dr. Carlota Raspberry not Dr. Irene Limbo since has not been seen since 2021.

## 2022-01-05 NOTE — Telephone Encounter (Signed)
Spoke to [employer] at Dr. Grier Mitts office. She will communicate with him about this current request to hold Xarelto.

## 2022-01-05 NOTE — Telephone Encounter (Signed)
error 

## 2022-01-05 NOTE — Telephone Encounter (Signed)
Meaasge came back pt has been released from Dr Irene Limbo not seen since 2021 so they are again asking for your comment as PCP do not see a presurgical visit in the last 3 months should I call and schedule an appointment to complete this?

## 2022-01-06 NOTE — Telephone Encounter (Signed)
Last visit with hematology in March 2021, follow-up as needed noted.    Due to history of extensive blood clot without provoking event, lifelong anticoagulation was recommended.  I am not sure if he would be able to be bridged with Lovenox around the time of his colonoscopy, but would like assistance from hematology with these recommendations.  Can we get him an appointment with hematology soon to discuss plan and recommendations for colonoscopy and follow-up?

## 2022-01-10 NOTE — Telephone Encounter (Signed)
Pt had returned call at 10:55 was unable to speak with him at that time attempted another call back but got VM again.

## 2022-01-10 NOTE — Telephone Encounter (Signed)
Called pt to explain that we need him to go back and have a follow up with Dr Irene Limbo (Hematology) for recommendations on the anticoagulation treatment, per last visit with Dr Irene Limbo was released but told to follow up as needed for concerns, pt needs to follow up with them before they will advise on pause of xarelto   Pt needs an appointment with Dr Irene Limbo then we can finish sign off for surgery.

## 2022-01-10 NOTE — Telephone Encounter (Signed)
Pt was advised by Dr Loletha Carrow to hold xarelto 2 days prior to procedure and pt was going to proceed with those orders.  Please advise pt does not wish to delay surgery any longer by having to have appt with hematology

## 2022-01-11 NOTE — Telephone Encounter (Signed)
After looking through the multiple messages regarding this issue, I reached out to [facility] at Dr Grier Mitts office to see if I could get a clear answer. She stated that Dr. Irene Limbo released him back to Dr. Carlota Raspberry and he was not a patient that had anything irregular going on that he needed to make an appt with them just to give the ok to hold xarelto. It looks as though the patient has already been instructed to do so by Dr. Loletha Carrow. Beth also let me know that they would not be able to get him in for a while, which is also part of the issue.   I have called to speak with patient and let him know that I wanted to get this cleared up for him because the back and forth isn't necessary. I let him know that per Mackenzie's message, Dr Loletha Carrow instructed him to hold the xarelto for 2 days prior, patient states that is what he had planned to do and at this time, he just wanted to be sure that Dr Carlota Raspberry was ok with that plan.

## 2022-01-11 NOTE — Telephone Encounter (Signed)
Drs. Carlota Raspberry and Irene Limbo,  This patient is overdue for a surveillance colonoscopy and is on Xarelto for history of PE.  We have given him conditional instructions to take his last dose of Xarelto 2 evenings prior to the colonoscopy.  However, our practice is to request final approval of such recommendations from the prescribing physician.  This patient's procedure is scheduled on 01/24/2022, and I would greatly appreciate one of you telling us whether or not you feel it is acceptable for [name] to have that brief periprocedural hold of Xarelto.  Thank you,  Tyler Randall, [facility]

## 2022-01-11 NOTE — Telephone Encounter (Signed)
Pt called back stating that he can't get in touch with Dr.Kales. Pt stated that he have left several messages. Pt want to know what he should do next.   Pt is upset that he has to see Dr.Kales for clearance for surgery. Pt states that he don't understand why Dr.Greene won't do his surgical clearance.

## 2022-01-11 NOTE — Telephone Encounter (Signed)
Dr. Loletha Carrow please see all the documentation below. I have not been able to receive a clear answer from Dr Irene Limbo or Dr Carlota Raspberry. It looks like it has come down to the patient needing an appointment with Dr. Irene Limbo for true clearance which will be long after his scheduled colonoscopy date. Please advise.

## 2022-01-12 NOTE — Telephone Encounter (Signed)
I had some concerns with stopping his Xarelto given his history of extensive blood clots without provoking event in the past. I understand it will be some time until he can be seen by hematology.  I will plan to call patient at lunch today to discuss risks and plan for holding anticoagulation. Thanks.

## 2022-01-12 NOTE — Telephone Encounter (Signed)
We greatly appreciate your assistance, Dr. Carlota Raspberry.  That sounds like a good plan.  Tyler Randall  _____________________________  Tyler Randall,  Please have this patient take his last dose of Xarelto 2 evenings prior to the procedure as we discussed initially, and I will give him guidance on when to resume it after the procedure is complete.  Most often, we will be resuming anticoagulation the day after the procedure.  - HD

## 2022-01-12 NOTE — Telephone Encounter (Signed)
With history of thromboembolism, and on my review of up-to-date and recommendations based on the PAUSE trial, if he is at low or moderate bleed risk with his colonoscopy procedure, then can stop his Xarelto day before surgery and resume 1 day after the procedure provided hemostasis is secured.  Total duration of interruption of anticoagulation would be 2 days.  If thought to be at high bleed risk from planned procedure then hold Xarelto 2 days prior to procedure and resume 2 days after procedure provided hemostasis is secure.  I will advise patient of these recommendations.  Please let me know if there are further questions.  Thanks.

## 2022-01-13 NOTE — Telephone Encounter (Signed)
Left message to return call 

## 2022-01-16 ENCOUNTER — Encounter: Payer: Self-pay | Admitting: Gastroenterology

## 2022-01-16 NOTE — Telephone Encounter (Signed)
Patient is aware of instructions as Dr Carlota Raspberry has notified him.

## 2022-01-24 ENCOUNTER — Encounter: Payer: Self-pay | Admitting: Gastroenterology

## 2022-01-24 ENCOUNTER — Ambulatory Visit (AMBULATORY_SURGERY_CENTER): Payer: Medicare HMO | Admitting: Gastroenterology

## 2022-01-24 VITALS — BP 116/83 | HR 67 | Temp 96.0°F | Resp 17 | Ht 72.0 in | Wt 140.0 lb

## 2022-01-24 DIAGNOSIS — K625 Hemorrhage of anus and rectum: Secondary | ICD-10-CM

## 2022-01-24 DIAGNOSIS — D120 Benign neoplasm of cecum: Secondary | ICD-10-CM

## 2022-01-24 DIAGNOSIS — D128 Benign neoplasm of rectum: Secondary | ICD-10-CM | POA: Diagnosis not present

## 2022-01-24 HISTORY — PX: COLONOSCOPY WITH PROPOFOL: SHX5780

## 2022-01-24 MED ORDER — SODIUM CHLORIDE 0.9 % IV SOLN: 500.0000 mL | Freq: Once | INTRAVENOUS | Status: DC

## 2022-01-24 NOTE — Progress Notes (Signed)
To pacu, VSS. Report to Rn.tb 

## 2022-01-24 NOTE — Patient Instructions (Addendum)
Handout on polyps given to patient.  Await pathology results. Resume previous diet and continue present medications. Resume Xarelto (rivaroxaban) at prior dose in 2 days (01/26/22) If biopsies confirm adenomatous tissue as before, refer to colorectal surgery. This polyp keeps recurring. Its location and prior interventions preclude EMR.   YOU HAD AN ENDOSCOPIC PROCEDURE TODAY AT [place]:   Refer to the procedure report that was given to you for any specific questions about what was found during the examination.  If the procedure report does not answer your questions, please call your gastroenterologist to clarify.  If you requested that your care partner not be given the details of your procedure findings, then the procedure report has been included in a sealed envelope for you to review at your convenience later.  YOU SHOULD EXPECT: Some feelings of bloating in the abdomen. Passage of more gas than usual.  Walking can help get rid of the air that was put into your GI tract during the procedure and reduce the bloating. If you had a lower endoscopy (such as a colonoscopy or flexible sigmoidoscopy) you may notice spotting of blood in your stool or on the toilet paper. If you underwent a bowel prep for your procedure, you may not have a normal bowel movement for a few days.  Please Note:  You might notice some irritation and congestion in your nose or some drainage.  This is from the oxygen used during your procedure.  There is no need for concern and it should clear up in a day or so.  SYMPTOMS TO REPORT IMMEDIATELY:  Following lower endoscopy (colonoscopy or flexible sigmoidoscopy):  Excessive amounts of blood in the stool  Significant tenderness or worsening of abdominal pains  Swelling of the abdomen that is new, acute  Fever of 100F or higher  For urgent or emergent issues, a gastroenterologist can be reached at any hour by calling [phone]. Do not use MyChart  messaging for urgent concerns.    DIET:  We do recommend a small meal at first, but then you may proceed to your regular diet.  Drink plenty of fluids but you should avoid alcoholic beverages for 24 hours.  ACTIVITY:  You should plan to take it easy for the rest of today and you should NOT DRIVE or use heavy machinery until tomorrow (because of the sedation medicines used during the test).    FOLLOW UP: Our staff will call the number listed on your records the next business day following your procedure.  We will call around 7:15- 8:00 am to check on you and address any questions or concerns that you may have regarding the information given to you following your procedure. If we do not reach you, we will leave a message.     If any biopsies were taken you will be contacted by phone or by letter within the next 1-3 weeks.  Please call us at [phone] if you have not heard about the biopsies in 3 weeks.    SIGNATURES/CONFIDENTIALITY: You and/or your care partner have signed paperwork which will be entered into your electronic medical record.  These signatures attest to the fact that that the information above on your After Visit Summary has been reviewed and is understood.  Full responsibility of the confidentiality of this discharge information lies with you and/or your care-partner.

## 2022-01-24 NOTE — Progress Notes (Signed)
History and Physical:  This patient presents for endoscopic testing for: Encounter Diagnosis  Name Primary?   Rectal bleeding Yes  Also history of rectal polyp. Flat anal verge tubular adenoma removed piecemeal cold biopsy forceps October 2019. Sigmoidoscopy April 2021 removed approximately 20 mm in greatest dimension irregularly-shaped flat tubular adenoma piecemeal with cold biopsies. Clinical details in my office note of 12/13/2021, where he was describing about 2 months of intermittent rectal pressure, "seepage" and small-volume bleeding.  Digital rectal exam was normal. His Xarelto has been briefly held for today's procedure.  Patient is otherwise without complaints or active issues today.   Past Medical History: Past Medical History:  Diagnosis Date   Allergy    Depression    Hypothyroidism    Melanoma ([place])    Prostate cancer ([place])    Pulmonary embolism ([place])      Past Surgical History: Past Surgical History:  Procedure Laterality Date   COLONOSCOPY  2014   melanoma excision from back      POLYPECTOMY  2014   PROSTATE SURGERY     radiation seed implants     TONSILLECTOMY      Allergies: Allergies  Allergen Reactions   Microplegia Msa-Msg [Cardioplegia [place] Formula] Hives and Swelling    M.S.G. in food.   Selenium Gluconate [Selenium] Anaphylaxis and Hives   Latex     With prolonged exposure   Sulfa Antibiotics     Not sure reaction, started in infancy    Outpatient Meds: Current Outpatient Medications  Medication Sig Dispense Refill   amLODipine (NORVASC) 5 MG tablet Take 1 tablet (5 mg total) by mouth daily. 90 tablet 2   diphenhydrAMINE (BENADRYL) 50 MG tablet Take 50 mg by mouth at bedtime as needed for itching.     levothyroxine (SYNTHROID) 100 MCG tablet Take 1 tablet (100 mcg total) by mouth daily. 90 tablet 2   Multiple Vitamins-Minerals (CENTRUM SILVER PO) Take 1 tablet by mouth.     hydrocortisone (ANUSOL-HC) 2.5 % rectal cream Place 1  Application rectally 2 (two) times daily. 30 g 0   rivaroxaban (XARELTO) 20 MG TABS tablet TAKE 1 TABLET (20 MG TOTAL) BY MOUTH DAILY WITH SUPPER. 90 tablet 2   tadalafil (CIALIS) 10 MG tablet TAKE 1/2 TO 1 (ONE-HALF TO ONE) TABLET BY MOUTH ONCE DAILY AS NEEDED 15 tablet 0   Current Facility-Administered Medications  Medication Dose Route Frequency Provider Last Rate Last Admin   0.9 %  sodium chloride infusion  500 mL Intravenous Once Danis, Kirke Corin, MD          ___________________________________________________________________ Objective   Exam:  BP (!) 150/78 (BP Location: Right Arm, Patient Position: Sitting, Cuff Size: Normal)   Pulse 82   Temp (!) 96 F (35.6 C) (Temporal)   Ht 6' (1.829 m)   Wt 140 lb (63.5 kg)   SpO2 97%   BMI 18.99 kg/m   CV: regular , S1/S2 Resp: clear to auscultation bilaterally, normal RR and effort noted GI: soft, no tenderness, with active bowel sounds.   Assessment: Encounter Diagnosis  Name Primary?   Rectal bleeding Yes     Plan: Colonoscopy  The benefits and risks of the planned procedure were described in detail with the patient or (when appropriate) their health care proxy.  Risks were outlined as including, but not limited to, bleeding, infection, perforation, adverse medication reaction leading to cardiac or pulmonary decompensation, pancreatitis (if ERCP).  The limitation of incomplete mucosal visualization was also  discussed.  No guarantees or warranties were given.    The patient is appropriate for an endoscopic procedure in the ambulatory setting.   - Wilfrid Lund, MD

## 2022-01-24 NOTE — Progress Notes (Signed)
Vitals-DT  Pt's states no medical or surgical changes since previsit or office visit.  

## 2022-01-24 NOTE — Op Note (Signed)
[facility] Patient Name: Tyler Randall Procedure Date: 01/24/2022 10:45 AM MRN: [id number] Endoscopist: Mallie Mussel L. Loletha Carrow , MD Age: 76 Referring MD:  Date of Birth: 1945/09/05 Gender: Male Account #: [id number] Procedure:                Colonoscopy Indications:              Rectal bleeding (intermittnet with fecal "seepage"                            for 3 months)                           Oct 2019 - flat TA anal verge removed with                            piecemeal cold Bx                           Apr 2021 - larger flat TA in same location removed                            piecemeal cold Bx Medicines:                Monitored Anesthesia Care Procedure:                Pre-Anesthesia Assessment:                           - Prior to the procedure, a History and Physical                            was performed, and patient medications and                            allergies were reviewed. The patient's tolerance of                            previous anesthesia was also reviewed. The risks                            and benefits of the procedure and the sedation                            options and risks were discussed with the patient.                            All questions were answered, and informed consent                            was obtained. Prior Anticoagulants: The patient has                            taken Xarelto (rivaroxaban), last dose was 2 days  prior to procedure. ASA Grade Assessment: III - A                            patient with severe systemic disease. After                            reviewing the risks and benefits, the patient was                            deemed in satisfactory condition to undergo the                            procedure.                           After obtaining informed consent, the colonoscope                            was passed under direct vision. Throughout the                             procedure, the patient's blood pressure, pulse, and                            oxygen saturations were monitored continuously. The                            Olympus CF-HQ190L [id number]) Colonoscope was                            introduced through the anus and advanced to the the                            cecum, identified by appendiceal orifice and                            ileocecal valve. The colonoscopy was performed                            without difficulty. The patient tolerated the                            procedure well. The quality of the bowel                            preparation was good. The ileocecal valve,                            appendiceal orifice, and rectum were photographed. Scope In: 11:06:41 AM Scope Out: 11:28:32 AM Scope Withdrawal Time: 0 hours 19 minutes 5 seconds  Total Procedure Duration: 0 hours 21 minutes 51 seconds  Findings:                 The perianal and digital rectal examinations were  normal.                           A diminutive polyp was found in the cecum. The                            polyp was semi-sessile. The polyp was removed with                            a cold snare. Resection and retrieval were                            complete. (Jar 1)                           A diminutive polyp was found in the mid rectum. The                            polyp was sessile. The polyp was removed with a                            cold snare. Resection and retrieval were complete.                            (Jar 2)                           A post polypectomy scar was found in the distal                            rectum. There was new, flat, probable polyp tissue                            adjacent to the scar. The scar was at about the                            3oclock position and the new polyp tissue extending                            to about the 12 o clock position. (see photos -                             fluid is in dependent area - patient in the left                            lateral position)                           Biopsies were taken with a cold forceps for                            histology.  The exam was otherwise without abnormality. Complications:            No immediate complications. Estimated Blood Loss:     Estimated blood loss was minimal. Impression:               - One diminutive polyp in the cecum, removed with a                            cold snare. Resected and retrieved.                           - One diminutive polyp in the mid rectum, removed                            with a cold snare. Resected and retrieved.                           - Post-polypectomy scar in the distal rectum.                            Biopsied.                           - The examination was otherwise normal.                           The reported symptoms appear to be from mucosal                            atrophy at the anal verge from prior polypectomies. Recommendation:           - Patient has a contact number available for                            emergencies. The signs and symptoms of potential                            delayed complications were discussed with the                            patient. Return to normal activities tomorrow.                            Written discharge instructions were provided to the                            patient.                           - Resume previous diet.                           - Continue present medications.                           - Resume Xarelto (rivaroxaban) at prior dose in 2  days. (01/26/22)                           - Await pathology results.                           - If Bx confirm adenomatous tissue as before, refer                            to colorectal surgery.                           This polyp keeps recurring. Its location and prior                             interventions preclude EMR. [name] L. Loletha Carrow, MD 01/24/2022 11:44:51 AM This report has been signed electronically.

## 2022-01-24 NOTE — Progress Notes (Signed)
Called to room to assist during endoscopic procedure.  Patient ID and intended procedure confirmed with present staff. Received instructions for my participation in the procedure from the performing physician.  

## 2022-01-25 ENCOUNTER — Telehealth: Payer: Self-pay

## 2022-01-25 NOTE — Telephone Encounter (Signed)
  Follow up Call-     01/24/2022   10:23 AM 01/24/2022   10:14 AM 07/22/2019   10:49 AM  Call back number  Post procedure Call Back phone  # [phone]  [phone]  Permission to leave phone message  Yes Yes     Patient questions:  Do you have a fever, pain , or abdominal swelling? No. Pain Score  0 *  Have you tolerated food without any problems? Yes.    Have you been able to return to your normal activities? Yes.    Do you have any questions about your discharge instructions: Diet   No. Medications  No. Follow up visit  No.  Do you have questions or concerns about your Care? No.  Actions: * If pain score is 4 or above: No action needed, pain <4.

## 2022-01-27 ENCOUNTER — Telehealth: Payer: Self-pay | Admitting: Family Medicine

## 2022-01-27 NOTE — Telephone Encounter (Signed)
Left message for patient to call back and schedule Medicare Annual Wellness Visit (AWV).   Please offer to do virtually or by telephone.  Left office number and my jabber [phone].  Last AWV:02/04/2021  Please schedule at anytime with Nurse Health Advisor.

## 2022-02-14 ENCOUNTER — Other Ambulatory Visit: Payer: Self-pay | Admitting: Family Medicine

## 2022-02-14 DIAGNOSIS — N529 Male erectile dysfunction, unspecified: Secondary | ICD-10-CM

## 2022-03-06 ENCOUNTER — Ambulatory Visit: Payer: Self-pay | Admitting: General Surgery

## 2022-03-06 NOTE — H&P (Signed)
   REFERRING PHYSICIAN:  Lorella Nimrod,*  PROVIDER:  Monico Blitz, MD  MRN: [id number] DOB: [birth year] DATE OF ENCOUNTER: 03/06/2022  Subjective  Chief Complaint: New Consultation (adenomatous polyp)     History of Present Illness: Tyler Randall is a 76 y.o. male who is seen today as an office consultation at the request of Dr. Loletha Carrow for evaluation of New Consultation (adenomatous polyp) .  76 year old male with a history of rectal bleeding and rectal polyp.  This was noted in 2019 and removed piecemeal during colonoscopy.  He underwent a sigmoidoscopy in April 2021 and this was once again removed.  He continues to have episodes of blood on the toilet paper and underwent a colonoscopy in October which showed polypoid tissue adjacent to a polypectomy scar.  Biopsies were taken.  This showed a tubular adenoma negative for high-grade dysplasia or carcinoma.   Review of Systems: A complete review of systems was obtained from the patient.  I have reviewed this information and discussed as appropriate with the patient.  See HPI as well for other ROS.   Medical History: Past Medical History: Diagnosis Date  Aneurysm (CMS-HCC)   DVT (deep venous thrombosis) (CMS-HCC)   History of cancer   Pulmonary hypertension (CMS-HCC)   Thyroid disease    There is no problem list on file for this patient.   Past Surgical History: Procedure Laterality Date  TONSILLECTOMY      Allergies Allergen Reactions  Sulfa (Sulfonamide Antibiotics) Hives   Not sure reaction, started in infancy   Current Outpatient Medications on File Prior to Visit Medication Sig Dispense Refill  hydrocortisone (ANUSOL-HC) 2.5 % rectal cream Place rectally    tadalafiL (CIALIS) 10 MG tablet TAKE 1/2 (ONE-HALF) TO 1 TABLET BY MOUTH ONCE DAILY AS NEEDED    amLODIPine (NORVASC) 5 MG tablet Take 5 mg by mouth once daily    diphenhydrAMINE (BENADRYL) 50 MG tablet Take by mouth    levothyroxine  (SYNTHROID) 100 MCG tablet Take 100 mcg by mouth once daily    XARELTO 20 mg tablet TAKE 1 TABLET BY MOUTH DAILY WITH SUPPER.    No current facility-administered medications on file prior to visit.   Family History Problem Relation Age of Onset  High blood pressure (Hypertension) Father     Social History  Tobacco Use Smoking Status Former  Types: Cigarettes Smokeless Tobacco Never    Social History  Socioeconomic History  Marital status: Single Tobacco Use  Smoking status: Former   Types: Cigarettes  Smokeless tobacco: Never Substance and Sexual Activity  Drug use: Never   Objective:   Vitals:  03/06/22 1545 BP: 116/70 Pulse: 70 Temp: 37.1 C (98.7 F) SpO2: 93% Weight: 64.5 kg (142 lb 3.2 oz) Height: 188 cm ('6\' 2"'$ )    Exam Gen: NAD Abd: soft Rectal: Anterior rectal mass approximately 4 cm from the anal verge   Labs, Imaging and Diagnostic Testing: Most recent colonoscopy report reviewed in detail.  Assessment and Plan: Diagnoses and all orders for this visit:  Adenomatous rectal polyp  Patient with a recurrent adenomatous rectal polyp despite at least 2 endoscopic resections.  I have recommended proceeding with transanal excision of the polyp with margin.  We discussed this in detail including risk of bleeding pain and infection as well as recurrence.  All questions were answered.    No follow-ups on file.    Rosario Adie, MD Colon and Rectal Surgery [facility]

## 2022-03-08 ENCOUNTER — Ambulatory Visit (INDEPENDENT_AMBULATORY_CARE_PROVIDER_SITE_OTHER): Payer: Medicare HMO | Admitting: Family Medicine

## 2022-03-08 ENCOUNTER — Encounter: Payer: Self-pay | Admitting: Family Medicine

## 2022-03-08 VITALS — BP 128/70 | HR 77 | Temp 97.9°F | Ht 72.0 in | Wt 144.6 lb

## 2022-03-08 DIAGNOSIS — I2694 Multiple subsegmental pulmonary emboli without acute cor pulmonale: Secondary | ICD-10-CM

## 2022-03-08 DIAGNOSIS — E7800 Pure hypercholesterolemia, unspecified: Secondary | ICD-10-CM

## 2022-03-08 DIAGNOSIS — Z0000 Encounter for general adult medical examination without abnormal findings: Secondary | ICD-10-CM

## 2022-03-08 DIAGNOSIS — L989 Disorder of the skin and subcutaneous tissue, unspecified: Secondary | ICD-10-CM

## 2022-03-08 DIAGNOSIS — N529 Male erectile dysfunction, unspecified: Secondary | ICD-10-CM

## 2022-03-08 DIAGNOSIS — C61 Malignant neoplasm of prostate: Secondary | ICD-10-CM | POA: Diagnosis not present

## 2022-03-08 DIAGNOSIS — E039 Hypothyroidism, unspecified: Secondary | ICD-10-CM | POA: Diagnosis not present

## 2022-03-08 DIAGNOSIS — I10 Essential (primary) hypertension: Secondary | ICD-10-CM | POA: Diagnosis not present

## 2022-03-08 DIAGNOSIS — D751 Secondary polycythemia: Secondary | ICD-10-CM

## 2022-03-08 LAB — CBC
HCT: 50.6 % (ref 39.0–52.0)
Hemoglobin: 17.2 g/dL — ABNORMAL HIGH (ref 13.0–17.0)
MCHC: 34 g/dL (ref 30.0–36.0)
MCV: 97.2 fl (ref 78.0–100.0)
Platelets: 223 10*3/uL (ref 150.0–400.0)
RBC: 5.21 Mil/uL (ref 4.22–5.81)
RDW: 13.9 % (ref 11.5–15.5)
WBC: 5.5 10*3/uL (ref 4.0–10.5)

## 2022-03-08 LAB — COMPREHENSIVE METABOLIC PANEL
ALT: 14 U/L (ref 0–53)
AST: 22 U/L (ref 0–37)
Albumin: 4.5 g/dL (ref 3.5–5.2)
Alkaline Phosphatase: 57 U/L (ref 39–117)
BUN: 13 mg/dL (ref 6–23)
CO2: 31 mEq/L (ref 19–32)
Calcium: 9.7 mg/dL (ref 8.4–10.5)
Chloride: 100 mEq/L (ref 96–112)
Creatinine, Ser: 0.84 mg/dL (ref 0.40–1.50)
GFR: 86.16 mL/min (ref >60.00)
Glucose, Bld: 106 mg/dL — ABNORMAL HIGH (ref 70–99)
Potassium: 4.5 mEq/L (ref 3.5–5.1)
Sodium: 140 mEq/L (ref 135–145)
Total Bilirubin: 1.5 mg/dL — ABNORMAL HIGH (ref 0.2–1.2)
Total Protein: 7.4 g/dL (ref 6.0–8.3)

## 2022-03-08 LAB — LIPID PANEL
Cholesterol: 211 mg/dL — ABNORMAL HIGH (ref 0–200)
HDL: 107.6 mg/dL (ref >39.00)
LDL Cholesterol: 91 mg/dL (ref 0–99)
NonHDL: 103.62
Total CHOL/HDL Ratio: 2
Triglycerides: 61 mg/dL (ref 0.0–149.0)
VLDL: 12.2 mg/dL (ref 0.0–40.0)

## 2022-03-08 LAB — TSH: TSH: 3.22 u[IU]/mL (ref 0.35–5.50)

## 2022-03-08 LAB — PSA, MEDICARE: PSA: 2.04 ng/ml (ref 0.10–4.00)

## 2022-03-08 MED ORDER — TADALAFIL 10 MG PO TABS: 10.0000 mg | ORAL_TABLET | ORAL | 11 refills | Status: AC | PRN

## 2022-03-08 MED ORDER — RIVAROXABAN 20 MG PO TABS: ORAL_TABLET | ORAL | 2 refills | Status: DC

## 2022-03-08 MED ORDER — LEVOTHYROXINE SODIUM 100 MCG PO TABS: 100.0000 ug | ORAL_TABLET | Freq: Every day | ORAL | 2 refills | Status: DC

## 2022-03-08 MED ORDER — AMLODIPINE BESYLATE 5 MG PO TABS: 5.0000 mg | ORAL_TABLET | Freq: Every day | ORAL | 2 refills | Status: DC

## 2022-03-08 NOTE — Patient Instructions (Signed)
I do see the small abrasion/irritation in the right mustache area.  Make sure that is followed up with dermatology in the next month.  If any increasing size, discharge, or worsening return for further evaluation.  No change in medications at this time.  If any concerns on labs I will let you know.  Take care!  Preventive Care 39 Years and Older, Male Preventive care refers to lifestyle choices and visits with your health care provider that can promote health and wellness. Preventive care visits are also called wellness exams. What can I expect for my preventive care visit? Counseling During your preventive care visit, your health care provider may ask about your: Medical history, including: Past medical problems. Family medical history. History of falls. Current health, including: Emotional well-being. Home life and relationship well-being. Sexual activity. Memory and ability to understand (cognition). Lifestyle, including: Alcohol, nicotine or tobacco, and drug use. Access to firearms. Diet, exercise, and sleep habits. Work and work Statistician. Sunscreen use. Safety issues such as seatbelt and bike helmet use. Physical exam Your health care provider will check your: Height and weight. These may be used to calculate your BMI (body mass index). BMI is a measurement that tells if you are at a healthy weight. Waist circumference. This measures the distance around your waistline. This measurement also tells if you are at a healthy weight and may help predict your risk of certain diseases, such as type 2 diabetes and high blood pressure. Heart rate and blood pressure. Body temperature. Skin for abnormal spots. What immunizations do I need?  Vaccines are usually given at various ages, according to a schedule. Your health care provider will recommend vaccines for you based on your age, medical history, and lifestyle or other factors, such as travel or where you work. What tests do I  need? Screening Your health care provider may recommend screening tests for certain conditions. This may include: Lipid and cholesterol levels. Diabetes screening. This is done by checking your blood sugar (glucose) after you have not eaten for a while (fasting). Hepatitis C test. Hepatitis B test. HIV (human immunodeficiency virus) test. STI (sexually transmitted infection) testing, if you are at risk. Lung cancer screening. Colorectal cancer screening. Prostate cancer screening. Abdominal aortic aneurysm (AAA) screening. You may need this if you are a current or former smoker. Talk with your health care provider about your test results, treatment options, and if necessary, the need for more tests. Follow these instructions at home: Eating and drinking  Eat a diet that includes fresh fruits and vegetables, whole grains, lean protein, and low-fat dairy products. Limit your intake of foods with high amounts of sugar, saturated fats, and salt. Take vitamin and mineral supplements as recommended by your health care provider. Do not drink alcohol if your health care provider tells you not to drink. If you drink alcohol: Limit how much you have to 0-2 drinks a day. Know how much alcohol is in your drink. In the [place] one drink equals one 12 oz bottle of beer (355 mL), one 5 oz glass of wine (148 mL), or one 1 oz glass of hard liquor (44 mL). Lifestyle Brush your teeth every morning and night with fluoride toothpaste. Floss one time each day. Exercise for at least 30 minutes 5 or more days each week. Do not use any products that contain nicotine or tobacco. These products include cigarettes, chewing tobacco, and vaping devices, such as e-cigarettes. If you need help quitting, ask your health care provider. Do  not use drugs. If you are sexually active, practice safe sex. Use a condom or other form of protection to prevent STIs. Take aspirin only as told by your health care provider. Make sure  that you understand how much to take and what form to take. Work with your health care provider to find out whether it is safe and beneficial for you to take aspirin daily. Ask your health care provider if you need to take a cholesterol-lowering medicine (statin). Find healthy ways to manage stress, such as: Meditation, yoga, or listening to music. Journaling. Talking to a trusted person. Spending time with friends and family. Safety Always wear your seat belt while driving or riding in a vehicle. Do not drive: If you have been drinking alcohol. Do not ride with someone who has been drinking. When you are tired or distracted. While texting. If you have been using any mind-altering substances or drugs. Wear a helmet and other protective equipment during sports activities. If you have firearms in your house, make sure you follow all gun safety procedures. Minimize exposure to UV radiation to reduce your risk of skin cancer. What's next? Visit your health care provider once a year for an annual wellness visit. Ask your health care provider how often you should have your eyes and teeth checked. Stay up to date on all vaccines. This information is not intended to replace advice given to you by your health care provider. Make sure you discuss any questions you have with your health care provider. Document Revised: 09/22/2020 Document Reviewed: 09/22/2020 Elsevier Patient Education  [place].

## 2022-03-08 NOTE — Progress Notes (Signed)
Subjective:  Patient ID: Tyler Randall, male    DOB: 11-Jun-1945  Age: 76 y.o. MRN: [id number]  CC:  Chief Complaint  Patient presents with   Annual Exam    Pt states he has a spot near his mustache that wont go away     HPI Tyler Randall presents for Annual Exam And concerns above  Care team: Pcp, me Gastroenterology, Dr. Loletha Carrow.  Recurrent adenomatous rectal polyp, recently met with surgeon for excision.  Dr. Marcello Moores. Planning on surgery.  Vascular, Dr. Cyndia Bent, ascending aortic aneurysm.  Last visit September 6.  4.6 cm ascending aortic aneurysm, not changed compared to 2020.  Surgical threshold 5.5 cm.  Option of increasing antihypertensive with ideal goal of BP 130/80 or lower.  Plan for 1 year repeat for repeat CTA of chest. Derm Wilhemina Bonito. Appt in December.   Skin lesion near mustache Small area of blood on R face 2 days ago scab, bled. Scabs over. No lesion noted prior. Hx of skin cancer - appt with Derm in December.   Hypothyroidism: Lab Results  Component Value Date   TSH 2.39 08/24/2021  Taking medication daily.  Synthroid 100 mcg daily.  No new hot or cold intolerance. No new hair or skin changes, heart palpitations or new fatigue. No new weight changes.   Hypercoagulable state with history of polycythemia, history of DVT/PE. 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  Option of bone marrow biopsy if significant increase in hematocrit.  Denies any bleeding on Xarelto 20 mg daily.  No chest pain or dyspnea. Lab Results  Component Value Date   WBC 5.6 11/09/2021   HGB 16.3 11/09/2021   HCT 48.7 11/09/2021   MCV 96.4 11/09/2021   PLT 187.0 11/09/2021   Hypertension: Amlodipine 5 mg daily.  Elevated at vascular appointment, stable today.  Stable October 17.  We have discussed impact of alcohol/beer on blood pressure and recommended cutting back.  5-6/day when discussed previously. Now 3-4 per day. Home  readings: 120/70 range.  BP Readings from Last 3 Encounters:  03/08/22 128/70  01/24/22 116/83  12/14/21 (!) 149/87   Lab Results  Component Value Date   CREATININE 0.83 08/24/2021   Erectile dysfunction: Takes 2 of the 44m cilais - working well. No HA/flushing, no CP with  exertion. No hearing/vision changes.   Screening lipids - tc 204 in 10/22. Normal A1c.      03/08/2022   11:01 AM 11/09/2021    1:06 PM 02/04/2021    9:16 AM 06/03/2020    2:40 PM 06/03/2020    2:27 PM  Depression screen PHQ 2/9  Decreased Interest 0 0 0 0 0  Down, Depressed, Hopeless 0 0 0 0 0  PHQ - 2 Score 0 0 0 0 0  Altered sleeping 0 0     Tired, decreased energy 0 0     Change in appetite 0 0     Feeling bad or failure about yourself  0 0     Trouble concentrating 0 0     Moving slowly or fidgety/restless 0 0     Suicidal thoughts 0 0     PHQ-9 Score 0 0       Health Maintenance  Topic Date Due   Medicare Annual Wellness (AWV)  11/27/2019   COVID-19 Vaccine (3 - Pfizer risk series) 03/24/2022 (Originally 07/29/2019)   Zoster Vaccines- Shingrix (1 of 2) 06/08/2022 (Originally 02/03/1965)   Lung  Cancer Screening  12/15/2022   COLONOSCOPY (Pts 45-6yr Insurance coverage will need to be confirmed)  01/24/2025   Pneumonia Vaccine 76 Years old  Completed   INFLUENZA VACCINE  Completed   Hepatitis C Screening  Completed   HPV VACCINES  Aged Out  Colon - followed by GI as above with planned surgery for polyp.  Prostate: has history of prostate cancer. S/p seed implant in 2003. Dr. GRisa Grill Routine follow up until few years ago - PSA yearly here.  The natural history of prostate cancer and ongoing controversy regarding screening and potential treatment outcomes of prostate cancer has been discussed with the patient. The meaning of a false positive PSA and a false negative PSA has been discussed. He indicates understanding of the limitations of this screening test and wishes to proceed with screening PSA  testing. Lab Results  Component Value Date   PSA1 0.9 05/16/2019   PSA1 1.4 11/09/2016   PSA 1.68 02/04/2021      Immunization History  Administered Date(s) Administered   Fluad Quad(high Dose 65+) 01/10/2019, 02/05/2020, 02/04/2021, 12/20/2021   Influenza Split 02/07/2012   Influenza, High Dose Seasonal PF 01/25/2017, 01/02/2018, 01/13/2022   Influenza,inj,Quad PF,6+ Mos 02/20/2013, 03/27/2014   Influenza-Unspecified 01/09/2016, 01/02/2018, 01/27/2021   PFIZER(Purple Top)SARS-COV-2 Vaccination 06/06/2019, 07/01/2019   Pneumococcal Conjugate-13 06/04/2014   Pneumococcal Polysaccharide-23 11/09/2016   Tdap 11/09/2016  Utd on vaccines. Had covid booster few months ago.  Shingrix years ago at pharmacy.    No results found.new optho - appt in March.   Dental: every 4 months.   Alcohol: 3-4 beers per day.  Tobacco: none.   Exercise:1 mile walk 3 days per week.    History Patient Active Problem List   Diagnosis Date Noted   [place]cythemia vera ([facility] 03/31/2019   Deep vein [place] (DVT) of proximal [place] of left lower [place] 03/02/2019   [place] 03/02/2019   Prostate cancer ([facility] 03/27/2014   Unspecified hypothyroidism 10/06/2013   Unspecified vitamin D deficiency 10/06/2013   Thyroid condition 11/28/2011   Past Medical History:  Diagnosis Date   Allergy    Depression    Hypothyroidism    [facility]    Prostate cancer ([facility]    Pulmonary embolism ([facility]    Past Surgical History:  Procedure Laterality Date   COLONOSCOPY  2014   melanoma excision from back      POLYPECTOMY  2014   PROSTATE SURGERY     radiation seed implants     TONSILLECTOMY     Allergies  Allergen Reactions   Microplegia Msa-Msg [Cardioplegia [place] Formula] Hives and Swelling    M.S.G. in food.   Selenium Gluconate [Selenium] Anaphylaxis and Hives   Latex     With prolonged exposure   Sulfa Antibiotics Hives    Not sure reaction, started in infancy    Prior to Admission medications   Medication Sig Start Date End Date Taking? Authorizing Provider  amLODipine (NORVASC) 5 MG tablet Take 1 tablet (5 mg total) by mouth daily. 08/24/21  Yes GWendie Agreste MD  diphenhydrAMINE (BENADRYL) 50 MG tablet Take 50 mg by mouth at bedtime as needed for itching.   Yes [provider]  hydrocortisone (ANUSOL-HC) 2.5 % rectal cream Place 1 Application rectally 2 (two) times daily. 11/09/21  Yes GWendie Agreste MD  levothyroxine (SYNTHROID) 100 MCG tablet Take 1 tablet (100 mcg total) by mouth daily. 08/24/21  Yes GWendie Agreste MD  Multiple Vitamins-Minerals (CENTRUM SILVER  PO) Take 1 tablet by mouth.   Yes [provider]  rivaroxaban (XARELTO) 20 MG TABS tablet TAKE 1 TABLET (20 MG TOTAL) BY MOUTH DAILY WITH SUPPER. 08/24/21  Yes [name], MD  tadalafil (CIALIS) 10 MG tablet TAKE 1/2 (ONE-HALF) TO 1 TABLET BY MOUTH ONCE DAILY AS NEEDED 02/14/22  Yes [name], MD  rivaroxaban (XARELTO) 20 MG TABS tablet Take 1 tablet by mouth daily with supper.    [provider]   Social History   Socioeconomic History   Marital status: Divorced    Spouse name: Not on file   Number of children: 0   Years of education: Not on file   Highest education level: Not on file  Occupational History   Occupation: retired  Tobacco Use   Smoking status: Former    Packs/day: 3.50    Years: 50.00    Total pack years: 175.00    Types: Cigarettes    Start date: 53    Quit date: 02/03/2016    Years since quitting: 6.0   Smokeless tobacco: Never   Tobacco comments:    cut down to 1 pack when quit in 2017  Vaping Use   Vaping Use: Former  Substance and Sexual Activity   Alcohol use: Yes    Alcohol/week: 20.0 standard drinks of alcohol    Types: 20 Cans of beer per week    Comment: 4 per day   Drug use: No   Sexual activity: Yes  Other Topics Concern   Not on file  Social History Narrative   Not on file   Social  Determinants of Health   Financial Resource Strain: Not on file  Food Insecurity: Not on file  Transportation Needs: Not on file  Physical Activity: Not on file  Stress: Not on file  Social Connections: Not on file  Intimate Partner Violence: Not on file    Review of Systems 13 point review of systems per patient health survey noted.  Negative other than as indicated above or in HPI.  And per hpi.   Objective:   Vitals:   03/08/22 1104  BP: 128/70  Pulse: 77  Temp: 97.9 F (36.6 C)  SpO2: 100%  Weight: 144 lb 9.6 oz (65.6 kg)  Height: 6' (1.829 m)     Physical Exam Vitals reviewed.  Constitutional:      Appearance: He is well-developed.  HENT:     Head: Normocephalic and atraumatic.     Right Ear: External ear normal.     Left Ear: External ear normal.     Ears:     Comments: Cerumen in bilateral canals, not obstructed. Eyes:     Conjunctiva/sclera: Conjunctivae normal.     Pupils: Pupils are equal, round, and reactive to light.  Neck:     Thyroid: No thyromegaly.  Cardiovascular:     Rate and Rhythm: Normal rate and regular rhythm.     Heart sounds: Normal heart sounds.  Pulmonary:     Effort: Pulmonary effort is normal. No respiratory distress.     Breath sounds: Normal breath sounds. No wheezing.  Abdominal:     General: There is no distension.     Palpations: Abdomen is soft.     Tenderness: There is no abdominal tenderness.  Musculoskeletal:        General: No tenderness. Normal range of motion.     Cervical back: Normal range of motion and neck supple.  Lymphadenopathy:     Cervical:  No cervical adenopathy.  Skin:    General: Skin is warm and dry.          Comments: Right mustache area, small abrasion approximately 3 mm, no active bleeding or discharge, no surrounding erythema.  Neurological:     Mental Status: He is alert and oriented to person, place, and time.     Deep Tendon Reflexes: Reflexes are normal and symmetric.  Psychiatric:         Behavior: Behavior normal.        Assessment & Plan:  Tyler Randall is a 76 y.o. male . Annual physical exam  - -anticipatory guidance as below in AVS, screening labs above. Health maintenance items as above in HPI discussed/recommended as applicable.   Multiple subsegmental pulmonary emboli without acute cor pulmonale (HCC) - Plan: rivaroxaban (XARELTO) 20 MG TABS tablet  -Tolerating current regimen, no new bleeding.  Check CBC.  Hypothyroidism, unspecified type - Plan: levothyroxine (SYNTHROID) 100 MCG tablet, TSH  -Check TSH, continue same dose Synthroid.  Stable.  Essential hypertension - Plan: amLODipine (NORVASC) 5 MG tablet, Comprehensive metabolic panel  -Tolerating current regimen, check labs.  Continue amlodipine same dose.  Polycythemia, secondary - Plan: CBC  -Check CBC, previously stable.  Plan as above if elevated hemoglobin.    Erectile dysfunction, unspecified erectile dysfunction type  -Stable with Cialis 10 mg, new prescription given.  Potential side effects and risks have been discussed.  Lowest effective dose.  Pure hypercholesterolemia - Plan: Comprehensive metabolic panel, Lipid panel  -Check labs, borderline prior.  Prostate cancer ([place]) - Plan: PSA, Medicare ( [place] only)  -Check PSA, status post treatment as above.  Monitor for changes.  Lesion of face  -Possible abrasion, but close follow-up with dermatology recommended.  RTC precautions of signs or symptoms of infection, surrounding erythema, or discharge.  Meds ordered this encounter  Medications   tadalafil (CIALIS) 10 MG tablet    Sig: Take 1 tablet (10 mg total) by mouth every other day as needed for erectile dysfunction.    Dispense:  10 tablet    Refill:  11   rivaroxaban (XARELTO) 20 MG TABS tablet    Sig: TAKE 1 TABLET (20 MG TOTAL) BY MOUTH DAILY WITH SUPPER.    Dispense:  90 tablet    Refill:  2   levothyroxine (SYNTHROID) 100 MCG tablet    Sig: Take 1 tablet (100 mcg  total) by mouth daily.    Dispense:  90 tablet    Refill:  2   amLODipine (NORVASC) 5 MG tablet    Sig: Take 1 tablet (5 mg total) by mouth daily.    Dispense:  90 tablet    Refill:  2   Patient Instructions  I do see the small abrasion/irritation in the right mustache area.  Make sure that is followed up with dermatology in the next month.  If any increasing size, discharge, or worsening return for further evaluation.  No change in medications at this time.  If any concerns on labs I will let you know.  Take care!  Preventive Care 105 Years and Older, Male Preventive care refers to lifestyle choices and visits with your health care provider that can promote health and wellness. Preventive care visits are also called wellness exams. What can I expect for my preventive care visit? Counseling During your preventive care visit, your health care provider may ask about your: Medical history, including: Past medical problems. Family medical history. History of falls. Current health,  including: Emotional well-being. Home life and relationship well-being. Sexual activity. Memory and ability to understand (cognition). Lifestyle, including: Alcohol, nicotine or tobacco, and drug use. Access to firearms. Diet, exercise, and sleep habits. Work and work Statistician. Sunscreen use. Safety issues such as seatbelt and bike helmet use. Physical exam Your health care provider will check your: Height and weight. These may be used to calculate your BMI (body mass index). BMI is a measurement that tells if you are at a healthy weight. Waist circumference. This measures the distance around your waistline. This measurement also tells if you are at a healthy weight and may help predict your risk of certain diseases, such as type 2 diabetes and high blood pressure. Heart rate and blood pressure. Body temperature. Skin for abnormal spots. What immunizations do I need?  Vaccines are usually given at  various ages, according to a schedule. Your health care provider will recommend vaccines for you based on your age, medical history, and lifestyle or other factors, such as travel or where you work. What tests do I need? Screening Your health care provider may recommend screening tests for certain conditions. This may include: Lipid and cholesterol levels. Diabetes screening. This is done by checking your blood sugar (glucose) after you have not eaten for a while (fasting). Hepatitis C test. Hepatitis B test. HIV (human immunodeficiency virus) test. STI (sexually transmitted infection) testing, if you are at risk. Lung cancer screening. Colorectal cancer screening. Prostate cancer screening. Abdominal aortic aneurysm (AAA) screening. You may need this if you are a current or former smoker. Talk with your health care provider about your test results, treatment options, and if necessary, the need for more tests. Follow these instructions at home: Eating and drinking  Eat a diet that includes fresh fruits and vegetables, whole grains, lean protein, and low-fat dairy products. Limit your intake of foods with high amounts of sugar, saturated fats, and salt. Take vitamin and mineral supplements as recommended by your health care provider. Do not drink alcohol if your health care provider tells you not to drink. If you drink alcohol: Limit how much you have to 0-2 drinks a day. Know how much alcohol is in your drink. In the [place] one drink equals one 12 oz bottle of beer (355 mL), one 5 oz glass of wine (148 mL), or one 1 oz glass of hard liquor (44 mL). Lifestyle Brush your teeth every morning and night with fluoride toothpaste. Floss one time each day. Exercise for at least 30 minutes 5 or more days each week. Do not use any products that contain nicotine or tobacco. These products include cigarettes, chewing tobacco, and vaping devices, such as e-cigarettes. If you need help quitting, ask  your health care provider. Do not use drugs. If you are sexually active, practice safe sex. Use a condom or other form of protection to prevent STIs. Take aspirin only as told by your health care provider. Make sure that you understand how much to take and what form to take. Work with your health care provider to find out whether it is safe and beneficial for you to take aspirin daily. Ask your health care provider if you need to take a cholesterol-lowering medicine (statin). Find healthy ways to manage stress, such as: Meditation, yoga, or listening to music. Journaling. Talking to a trusted person. Spending time with friends and family. Safety Always wear your seat belt while driving or riding in a vehicle. Do not drive: If you have been drinking alcohol.  Do not ride with someone who has been drinking. When you are tired or distracted. While texting. If you have been using any mind-altering substances or drugs. Wear a helmet and other protective equipment during sports activities. If you have firearms in your house, make sure you follow all gun safety procedures. Minimize exposure to UV radiation to reduce your risk of skin cancer. What's next? Visit your health care provider once a year for an annual wellness visit. Ask your health care provider how often you should have your eyes and teeth checked. Stay up to date on all vaccines. This information is not intended to replace advice given to you by your health care provider. Make sure you discuss any questions you have with your health care provider. Document Revised: 09/22/2020 Document Reviewed: 09/22/2020 Elsevier Patient Education  [place],   Merri Ray, MD [place] 03/08/22 11:59 AM

## 2022-04-10 HISTORY — PX: RETINAL TEAR REPAIR CRYOTHERAPY: SHX5304

## 2022-04-25 ENCOUNTER — Encounter (HOSPITAL_BASED_OUTPATIENT_CLINIC_OR_DEPARTMENT_OTHER): Payer: Self-pay | Admitting: General Surgery

## 2022-04-25 NOTE — Progress Notes (Signed)
Spoke w/ via phone for pre-op interview--- pt Lab needs dos----  [employer], ekg             Lab results------ no COVID test -----patient states asymptomatic no test needed Arrive at ------- 0730 on 04-27-2022 NPO after MN NO Solid Food.  Clear liquids from MN until--- 0630 Med rec completed Medications to take morning of surgery ----- synthroid Diabetic medication ----- n/a Patient instructed no nail polish to be worn day of surgery Patient instructed to bring photo id and insurance card day of surgery Patient aware to have Driver (ride ) / caregiver    for 24 hours after surgery -friend, bob Patient Special Instructions ----- n/a Pre-Op special Istructions -----  called and spoke w/ triage nurse, Abigail Butts, for Dr Marcello Moores office.  Inquired about pt's xarelto if need to stop or continue.  Abigail Butts spoke w/ Dr Francetta Found in the office , per Dr Marcello Moores pt is to continue xarelto.  Pt verbalized understanding to not stop xarelto prior to surgery. Patient verbalized understanding of instructions that were given at this phone interview. Patient denies shortness of breath, chest pain, fever, cough at this phone interview.

## 2022-04-27 ENCOUNTER — Other Ambulatory Visit: Payer: Self-pay

## 2022-04-27 ENCOUNTER — Ambulatory Visit (HOSPITAL_BASED_OUTPATIENT_CLINIC_OR_DEPARTMENT_OTHER): Payer: Medicare HMO | Admitting: Anesthesiology

## 2022-04-27 ENCOUNTER — Encounter (HOSPITAL_BASED_OUTPATIENT_CLINIC_OR_DEPARTMENT_OTHER)
Admission: RE | Disposition: A | Discharge status: Home / Self Care | Payer: Self-pay | Source: Home / Self Care | Attending: General Surgery

## 2022-04-27 ENCOUNTER — Encounter (HOSPITAL_BASED_OUTPATIENT_CLINIC_OR_DEPARTMENT_OTHER): Payer: Self-pay | Admitting: General Surgery

## 2022-04-27 ENCOUNTER — Ambulatory Visit (HOSPITAL_BASED_OUTPATIENT_CLINIC_OR_DEPARTMENT_OTHER)
Admission: RE | Admit: 2022-04-27 | Discharge: 2022-04-27 | Disposition: A | Discharge status: Home / Self Care | Payer: Medicare HMO | Attending: General Surgery | Admitting: General Surgery

## 2022-04-27 DIAGNOSIS — Z79899 Other long term (current) drug therapy: Secondary | ICD-10-CM | POA: Insufficient documentation

## 2022-04-27 DIAGNOSIS — F32A Depression, unspecified: Secondary | ICD-10-CM | POA: Diagnosis not present

## 2022-04-27 DIAGNOSIS — Z01818 Encounter for other preprocedural examination: Secondary | ICD-10-CM

## 2022-04-27 DIAGNOSIS — K621 Rectal polyp: Secondary | ICD-10-CM | POA: Diagnosis present

## 2022-04-27 DIAGNOSIS — J449 Chronic obstructive pulmonary disease, unspecified: Secondary | ICD-10-CM

## 2022-04-27 DIAGNOSIS — Z87891 Personal history of nicotine dependence: Secondary | ICD-10-CM | POA: Insufficient documentation

## 2022-04-27 DIAGNOSIS — I10 Essential (primary) hypertension: Secondary | ICD-10-CM | POA: Diagnosis not present

## 2022-04-27 DIAGNOSIS — E039 Hypothyroidism, unspecified: Secondary | ICD-10-CM | POA: Diagnosis not present

## 2022-04-27 HISTORY — DX: Abdominal aortic aneurysm, without rupture, unspecified: I71.40

## 2022-04-27 HISTORY — PX: TRANSANAL RECTAL RESECTION: SHX2562

## 2022-04-27 HISTORY — DX: Presence of spectacles and contact lenses: Z97.3

## 2022-04-27 HISTORY — DX: Secondary polycythemia: D75.1

## 2022-04-27 HISTORY — DX: Personal history of adenomatous and serrated colon polyps: Z86.0101

## 2022-04-27 HISTORY — DX: Personal history of other malignant neoplasm of skin: Z85.828

## 2022-04-27 HISTORY — DX: Rectal polyp: K62.1

## 2022-04-27 HISTORY — DX: Personal history of colonic polyps: Z86.010

## 2022-04-27 HISTORY — DX: Benign prostatic hyperplasia with lower urinary tract symptoms: N40.1

## 2022-04-27 HISTORY — DX: Diseases of lips: K13.0

## 2022-04-27 HISTORY — DX: Chronic obstructive pulmonary disease, unspecified: J44.9

## 2022-04-27 HISTORY — DX: Essential (primary) hypertension: I10

## 2022-04-27 HISTORY — DX: Long term (current) use of anticoagulants: Z79.01

## 2022-04-27 LAB — POCT I-STAT, CHEM 8
BUN: 12 mg/dL (ref 8–23)
Calcium, Ion: 1.2 mmol/L (ref 1.15–1.40)
Chloride: 102 mmol/L (ref 98–111)
Creatinine, Ser: 0.8 mg/dL (ref 0.61–1.24)
Glucose, Bld: 93 mg/dL (ref 70–99)
HCT: 48 % (ref 39.0–52.0)
Hemoglobin: 16.3 g/dL (ref 13.0–17.0)
Potassium: 4.5 mmol/L (ref 3.5–5.1)
Sodium: 140 mmol/L (ref 135–145)
TCO2: 26 mmol/L (ref 22–32)

## 2022-04-27 SURGERY — TRANSANAL RESECTION OF RECTAL TUMOR
Anesthesia: Monitor Anesthesia Care | Site: Rectum

## 2022-04-27 MED ORDER — PROPOFOL 500 MG/50ML IV EMUL
INTRAVENOUS | Status: DC | PRN
  Administered 2022-04-27: 200 ug/kg/min via INTRAVENOUS

## 2022-04-27 MED ORDER — SODIUM CHLORIDE 0.9% FLUSH: 3.0000 mL | Freq: Two times a day (BID) | INTRAVENOUS | Status: DC

## 2022-04-27 MED ORDER — PROPOFOL 500 MG/50ML IV EMUL
INTRAVENOUS | Status: AC
  Filled 2022-04-27: qty 50

## 2022-04-27 MED ORDER — SODIUM CHLORIDE 0.9 % IV SOLN
1.0000 g | INTRAVENOUS | Status: AC
  Administered 2022-04-27: 1 g via INTRAVENOUS
  Filled 2022-04-27: qty 1

## 2022-04-27 MED ORDER — FENTANYL CITRATE (PF) 100 MCG/2ML IJ SOLN
INTRAMUSCULAR | Status: DC | PRN
  Administered 2022-04-27 (×3): 25 ug via INTRAVENOUS
  Administered 2022-04-27: 50 ug via INTRAVENOUS

## 2022-04-27 MED ORDER — FENTANYL CITRATE (PF) 100 MCG/2ML IJ SOLN
INTRAMUSCULAR | Status: AC
  Filled 2022-04-27: qty 2

## 2022-04-27 MED ORDER — MIDAZOLAM HCL 2 MG/2ML IJ SOLN
INTRAMUSCULAR | Status: AC
  Filled 2022-04-27: qty 2

## 2022-04-27 MED ORDER — BUPIVACAINE-EPINEPHRINE 0.5% -1:200000 IJ SOLN
INTRAMUSCULAR | Status: DC | PRN
  Administered 2022-04-27: 30 mL

## 2022-04-27 MED ORDER — MIDAZOLAM HCL 5 MG/5ML IJ SOLN
INTRAMUSCULAR | Status: DC | PRN
  Administered 2022-04-27: 2 mg via INTRAVENOUS

## 2022-04-27 MED ORDER — PROPOFOL 10 MG/ML IV BOLUS
INTRAVENOUS | Status: DC | PRN
  Administered 2022-04-27 (×4): 20 mg via INTRAVENOUS

## 2022-04-27 MED ORDER — TRAMADOL HCL 50 MG PO TABS: 50.0000 mg | ORAL_TABLET | Freq: Four times a day (QID) | ORAL | 0 refills | Status: DC | PRN

## 2022-04-27 MED ORDER — ACETAMINOPHEN 500 MG PO TABS
1000.0000 mg | ORAL_TABLET | ORAL | Status: AC
  Administered 2022-04-27: 1000 mg via ORAL

## 2022-04-27 MED ORDER — 0.9 % SODIUM CHLORIDE (POUR BTL) OPTIME
TOPICAL | Status: DC | PRN
  Administered 2022-04-27: 500 mL

## 2022-04-27 MED ORDER — ACETAMINOPHEN 500 MG PO TABS
ORAL_TABLET | ORAL | Status: AC
  Filled 2022-04-27: qty 2

## 2022-04-27 MED ORDER — SODIUM CHLORIDE 0.9 % IV SOLN: INTRAVENOUS | Status: DC

## 2022-04-27 MED ORDER — ONDANSETRON HCL 4 MG/2ML IJ SOLN
INTRAMUSCULAR | Status: DC | PRN
  Administered 2022-04-27: 4 mg via INTRAVENOUS

## 2022-04-27 SURGICAL SUPPLY — 36 items
BLADE EXTENDED COATED 6.5IN (ELECTRODE) IMPLANT
BLADE SURG 10 STRL SS (BLADE) IMPLANT
BRIEF MESH DISP LRG (UNDERPADS AND DIAPERS) IMPLANT
COVER BACK TABLE 60X90IN (DRAPES) ×2 IMPLANT
COVER MAYO STAND STRL (DRAPES) ×2 IMPLANT
DISSECTOR SURG LIGASURE 21 (MISCELLANEOUS) IMPLANT
DRAPE LAPAROTOMY 100X72 PEDS (DRAPES) ×2 IMPLANT
DRAPE UTILITY XL STRL (DRAPES) ×2 IMPLANT
ELECT REM PT RETURN 9FT ADLT (ELECTROSURGICAL) ×1
ELECTRODE REM PT RTRN 9FT ADLT (ELECTROSURGICAL) ×2 IMPLANT
GAUZE PAD ABD 8X10 STRL (GAUZE/BANDAGES/DRESSINGS) ×2 IMPLANT
GAUZE SPONGE 4X4 12PLY STRL (GAUZE/BANDAGES/DRESSINGS) IMPLANT
GLOVE BIO SURGEON STRL SZ 6.5 (GLOVE) ×2 IMPLANT
GLOVE BIOGEL PI IND STRL 7.0 (GLOVE) ×2 IMPLANT
GLOVE INDICATOR 6.5 STRL GRN (GLOVE) ×2 IMPLANT
KIT SIGMOIDOSCOPE (SET/KITS/TRAYS/PACK) IMPLANT
KIT TURNOVER CYSTO (KITS) ×2 IMPLANT
Ligasure Exact Dissector IMPLANT
NEEDLE HYPO 22GX1.5 SAFETY (NEEDLE) ×2 IMPLANT
NS IRRIG 500ML POUR BTL (IV SOLUTION) ×2 IMPLANT
PACK BASIN DAY SURGERY FS (CUSTOM PROCEDURE TRAY) ×2 IMPLANT
PAD ARMBOARD 7.5X6 YLW CONV (MISCELLANEOUS) IMPLANT
PENCIL SMOKE EVACUATOR (MISCELLANEOUS) ×2 IMPLANT
SPONGE HEMORRHOID 8X3CM (HEMOSTASIS) IMPLANT
SPONGE SURGIFOAM ABS GEL 100 (HEMOSTASIS) IMPLANT
SPONGE SURGIFOAM ABS GEL 12-7 (HEMOSTASIS) IMPLANT
SUT CHROMIC 2 0 SH (SUTURE) ×4 IMPLANT
SUT CHROMIC 3 0 SH 27 (SUTURE) ×4 IMPLANT
SUT VIC AB 2-0 SH 27 (SUTURE) ×2
SUT VIC AB 2-0 SH 27XBRD (SUTURE) IMPLANT
SUT VIC AB 4-0 SH 18 (SUTURE) IMPLANT
SYR CONTROL 10ML LL (SYRINGE) ×2 IMPLANT
TOWEL OR 17X26 10 PK STRL BLUE (TOWEL DISPOSABLE) ×2 IMPLANT
TRAY DSU PREP LF (CUSTOM PROCEDURE TRAY) ×2 IMPLANT
TUBE CONNECTING 12X1/4 (SUCTIONS) ×2 IMPLANT
YANKAUER SUCT BULB TIP NO VENT (SUCTIONS) ×2 IMPLANT

## 2022-04-27 NOTE — Discharge Instructions (Addendum)
Beginning the day after surgery:  You may sit in a tub of warm water 2-3 times a day to relieve discomfort.  Eat a regular diet high in fiber.  Avoid foods that give you constipation or diarrhea.  Avoid foods that are difficult to digest, such as seeds, nuts, corn or popcorn.  Do not go any longer than 2 days without a bowel movement.  You may take a dose of Milk of Magnesia if you become constipated.    Drink 6-8 glasses of water daily.  Walking is encouraged.  Avoid strenuous activity and heavy lifting for one month after surgery.    Call the office if you have any questions or concerns.  Call immediately if you develop:  Excessive rectal bleeding (more than a cup or passing large clots) Increased discomfort Fever greater than 100 F Difficulty urinating   Do not take any Tylenol until after 2:00 pm today, if needed.    Post Anesthesia Home Care Instructions  Activity: Get plenty of rest for the remainder of the day. A responsible individual must stay with you for 24 hours following the procedure.  For the next 24 hours, DO NOT: -Drive a car -Paediatric nurse -Drink alcoholic beverages -Take any medication unless instructed by your physician -Make any legal decisions or sign important papers.  Meals: Start with liquid foods such as gelatin or soup. Progress to regular foods as tolerated. Avoid greasy, spicy, heavy foods. If nausea and/or vomiting occur, drink only clear liquids until the nausea and/or vomiting subsides. Call your physician if vomiting continues.  Special Instructions/Symptoms: Your throat may feel dry or sore from the anesthesia or the breathing tube placed in your throat during surgery. If this causes discomfort, gargle with warm salt water. The discomfort should disappear within 24 hours.

## 2022-04-27 NOTE — Anesthesia Postprocedure Evaluation (Signed)
Anesthesia Post Note  Patient: Tyler Randall  Procedure(s) Performed: TRANSANAL RESECTION rectal polyp (Rectum)     Patient location during evaluation: PACU Anesthesia Type: MAC Level of consciousness: awake and alert Pain management: pain level controlled Vital Signs Assessment: post-procedure vital signs reviewed and stable Respiratory status: spontaneous breathing, nonlabored ventilation, respiratory function stable and patient connected to nasal cannula oxygen Cardiovascular status: stable and blood pressure returned to baseline Postop Assessment: no apparent nausea or vomiting Anesthetic complications: no  No notable events documented.  Last Vitals:  Vitals:   04/27/22 1202 04/27/22 1203  BP:  130/80  Pulse:  82  Resp:  16  Temp: (!) 36.4 C   SpO2:  100%    Last Pain:  Vitals:   04/27/22 1203  TempSrc:   PainSc: 0-No pain                 Effie Berkshire

## 2022-04-27 NOTE — Anesthesia Preprocedure Evaluation (Signed)
Anesthesia Evaluation  Patient identified by MRN, date of birth, ID band Patient awake    Reviewed: Allergy & Precautions, NPO status , Patient's Chart, lab work & pertinent test results  Airway Mallampati: I  TM Distance: >3 FB Neck ROM: Full    Dental  (+) Teeth Intact, Dental Advisory Given   Pulmonary COPD, former smoker    + decreased breath sounds      Cardiovascular hypertension, Pt. on medications  Rhythm:Regular Rate:Normal     Neuro/Psych  PSYCHIATRIC DISORDERS  Depression       GI/Hepatic negative GI ROS, Neg liver ROS,,,  Endo/Other  Hypothyroidism    Renal/GU negative Renal ROS     Musculoskeletal negative musculoskeletal ROS (+)    Abdominal   Peds  Hematology   Anesthesia Other Findings   Reproductive/Obstetrics                             Anesthesia Physical Anesthesia Plan  ASA: 3  Anesthesia Plan: MAC   Post-op Pain Management: Tylenol PO (pre-op)*   Induction: Intravenous  PONV Risk Score and Plan: 2 and Ondansetron, Midazolam and Propofol infusion  Airway Management Planned: Natural Airway and Nasal Cannula  Additional Equipment: None  Intra-op Plan:   Post-operative Plan:   Informed Consent: I have reviewed the patients History and Physical, chart, labs and discussed the procedure including the risks, benefits and alternatives for the proposed anesthesia with the patient or authorized representative who has indicated his/her understanding and acceptance.       Plan Discussed with: CRNA  Anesthesia Plan Comments:        Anesthesia Quick Evaluation

## 2022-04-27 NOTE — Transfer of Care (Signed)
Immediate Anesthesia Transfer of Care Note  Patient: TREDARIUS COBERN  Procedure(s) Performed: TRANSANAL RESECTION rectal polyp (Rectum)  Patient Location: PACU  Anesthesia Type:MAC  Level of Consciousness: awake, alert , oriented, and patient cooperative  Airway & Oxygen Therapy: Patient Spontanous Breathing  Post-op Assessment: Report given to RN and Post -op Vital signs reviewed and stable  Post vital signs: Reviewed and stable  Last Vitals:  Vitals Value Taken Time  BP    Temp    Pulse 89 04/27/22 1131  Resp 15 04/27/22 1131  SpO2 100 % 04/27/22 1131  Vitals shown include unvalidated device data.  Last Pain:  Vitals:   04/27/22 0802  TempSrc: Oral  PainSc: 0-No pain      Patients Stated Pain Goal: 5 (15/72/62 0355)  Complications: No notable events documented.

## 2022-04-27 NOTE — H&P (Signed)
REFERRING PHYSICIAN:  Lorella Nimrod,*   PROVIDER:  Monico Blitz, MD   MRN: [id number] DOB: May 17, 1945 DATE OF ENCOUNTER: 03/06/2022   Subjective   Chief Complaint: New Consultation (adenomatous polyp)       History of Present Illness: Tyler Randall is a 77 y.o. male who is seen today as an office consultation at the request of Dr. Loletha Carrow for evaluation of New Consultation (adenomatous polyp) .  77 year old male with a history of rectal bleeding and rectal polyp.  This was noted in 2019 and removed piecemeal during colonoscopy.  He underwent a sigmoidoscopy in April 2021 and this was once again removed.  He continues to have episodes of blood on the toilet paper and underwent a colonoscopy in October which showed polypoid tissue adjacent to a polypectomy scar.  Biopsies were taken.  This showed a tubular adenoma negative for high-grade dysplasia or carcinoma.     Review of Systems: A complete review of systems was obtained from the patient.  I have reviewed this information and discussed as appropriate with the patient.  See HPI as well for other ROS.     Medical History: Past Medical History: Diagnosis        Date            Aneurysm (CMS-HCC)                       DVT (deep venous thrombosis) (CMS-HCC)             History of cancer                     Pulmonary hypertension (CMS-HCC)                        Thyroid disease               There is no problem list on file for this patient.     Past Surgical History: Procedure       Laterality         Date            TONSILLECTOMY                      Allergies Allergen           Reactions            Sulfa (Sulfonamide Antibiotics)          Hives                         Not sure reaction, started in infancy     Current Outpatient Medications on File Prior to Visit Medication       Sig       Dispense         Refill            hydrocortisone (ANUSOL-HC) 2.5 % rectal cream    Place rectally                            tadalafiL (CIALIS) 10 MG tablet         TAKE 1/2 (ONE-HALF) TO 1 TABLET BY MOUTH ONCE DAILY AS NEEDED                         amLODIPine (NORVASC) 5 MG  tablet          Take 5 mg by mouth once daily                                 diphenhydrAMINE (BENADRYL) 50 MG tablet         Take by mouth                                    levothyroxine (SYNTHROID) 100 MCG tablet           Take 100 mcg by mouth once daily                               XARELTO 20 mg tablet          TAKE 1 TABLET BY MOUTH DAILY WITH SUPPER.                      No current facility-administered medications on file prior to visit.     Family History Problem           Relation           Age of Onset            High blood pressure (Hypertension)   Father       Social History   Tobacco Use Smoking Status           Former            Types: Cigarettes Smokeless Tobacco   Never     Social History   Socioeconomic History            Marital status:  Single Tobacco Use            Smoking status:          Former                         Types: Cigarettes            Smokeless tobacco:    Never Substance and Sexual Activity            Drug use:        Never     Objective:   Ht '6\' 2"'$  (1.88 m)   Wt 63.5 kg   BMI 17.97 kg/m     Exam Gen: NAD Abd: soft Rectal: Anterior rectal mass approximately 4 cm from the anal verge     Labs, Imaging and Diagnostic Testing: Most recent colonoscopy report reviewed in detail.   Assessment and Plan: Diagnoses and all orders for this visit:   Adenomatous rectal polyp   Patient with a recurrent adenomatous rectal polyp despite at least 2 endoscopic resections.  I have recommended proceeding with transanal excision of the polyp with margin.  We discussed this in detail including risk of bleeding pain and infection as well as recurrence.  All questions were answered.       No follow-ups on file.     Rosario Adie, MD Colon and Rectal Surgery [facility]  Surgery

## 2022-04-27 NOTE — Op Note (Signed)
04/27/2022  11:28 AM  PATIENT:  Tyler Randall  77 y.o. male  Patient Care Team: [name], MD as PCP - General (Family Medicine) Loletha Carrow, Kirke Corin, MD as Consulting Physician (Gastroenterology) Leighton Ruff, MD as Consulting Physician (General Surgery) Danella Sensing, MD as Consulting Physician (Dermatology) Gaye Pollack, MD as Consulting Physician (Cardiothoracic Surgery)  PRE-OPERATIVE DIAGNOSIS:  Distal rectal polyp  POST-OPERATIVE DIAGNOSIS:  Distal rectal polyp  PROCEDURE:   TRANSANAL RESECTION DISTAL RECTAL POLYPECTOMY SCAR   Surgeon(s): Leighton Ruff, MD  ASSISTANT: none   ANESTHESIA:   local and MAC  SPECIMEN:  Source of Specimen:  polypectomy scar (anterior distal rectum)  DISPOSITION OF SPECIMEN:  PATHOLOGY  COUNTS:  YES  PLAN OF CARE: Discharge to home after PACU  PATIENT DISPOSITION:  PACU - hemodynamically stable.  INDICATION: 77 y.o. M with recurrent distal rectal polyp   OR FINDINGS: No polypoid tissue visualized in the distal 10 cm of rectum.  Scar noted anteriorly, ~2cm proximal to dentate line  DESCRIPTION: the patient was identified in the preoperative holding area and taken to the OR where they were laid on the operating room table.  MAC anesthesia was induced without difficulty. The patient was then positioned in prone jackknife position with buttocks gently taped apart.  The patient was then prepped and draped in usual sterile fashion.  SCDs were noted to be in place prior to the initiation of anesthesia. A surgical timeout was performed indicating the correct patient, procedure, positioning and need for preoperative antibiotics.  A rectal block was performed using Marcaine with epinephrine.    I began with a digital rectal exam.  There was a smooth scar noted in the anterior distal rectum.  No polypoid masses could be palpated.  I then placed a Hill-Ferguson anoscope into the anal canal and evaluated this completely.  Scar was noted as  previously stated, but no other masses were noted.  I confirmed that there were no further polypoid masses proximally to approximately 10 cm using the rigid sigmoidoscope.  I decided to resect the polypectomy scar.  I injected Marcaine submucosally to allow for the polyp to lift.  I marked 5 mm borders with electrocautery.  I then used a hand-held LigaSure device to divide the mucosa down to the muscular layer.  The entire specimen was removed and sent to pathology as polypectomy scar.  I then took an additional deep margin at the scar site.  Once this was complete the edges of the polypectomy site were reapproximated using 2, running 2-0 Vicryl sutures.  Hemostasis was good.   Additional Marcaine was placed distally.  A Gelfoam sponge was placed into the anal canal for added hemostasis.  A dressing was applied.  The patient was then awakened from anesthesia and sent to the postanesthesia care unit in stable condition.  All counts were correct per operating room staff.  Rosario Adie, MD  Col[facility] [place] [facility]

## 2022-04-28 ENCOUNTER — Encounter (HOSPITAL_BASED_OUTPATIENT_CLINIC_OR_DEPARTMENT_OTHER): Payer: Self-pay | Admitting: General Surgery

## 2022-05-01 LAB — SURGICAL PATHOLOGY

## 2022-06-08 ENCOUNTER — Telehealth: Payer: Self-pay | Admitting: Family Medicine

## 2022-06-08 NOTE — Telephone Encounter (Signed)
Called patient to schedule a 6 month follow up with Dr Carlota Raspberry, no answer,LM for patient to call us back at the office to schedule his follow up.

## 2022-07-13 ENCOUNTER — Ambulatory Visit (INDEPENDENT_AMBULATORY_CARE_PROVIDER_SITE_OTHER): Payer: Medicare HMO | Admitting: Family Medicine

## 2022-07-13 ENCOUNTER — Encounter: Payer: Self-pay | Admitting: Family Medicine

## 2022-07-13 VITALS — BP 122/68 | HR 79 | Temp 98.2°F | Ht 74.0 in | Wt 142.4 lb

## 2022-07-13 DIAGNOSIS — I10 Essential (primary) hypertension: Secondary | ICD-10-CM

## 2022-07-13 DIAGNOSIS — E039 Hypothyroidism, unspecified: Secondary | ICD-10-CM | POA: Diagnosis not present

## 2022-07-13 DIAGNOSIS — E7800 Pure hypercholesterolemia, unspecified: Secondary | ICD-10-CM | POA: Diagnosis not present

## 2022-07-13 DIAGNOSIS — D45 Polycythemia vera: Secondary | ICD-10-CM

## 2022-07-13 DIAGNOSIS — E079 Disorder of thyroid, unspecified: Secondary | ICD-10-CM

## 2022-07-13 DIAGNOSIS — I2694 Multiple subsegmental pulmonary emboli without acute cor pulmonale: Secondary | ICD-10-CM

## 2022-07-13 LAB — LIPID PANEL
Cholesterol: 210 mg/dL — ABNORMAL HIGH (ref 0–200)
HDL: 99.5 mg/dL (ref >39.00)
LDL Cholesterol: 99 mg/dL (ref 0–99)
NonHDL: 110.54
Total CHOL/HDL Ratio: 2
Triglycerides: 58 mg/dL (ref 0.0–149.0)
VLDL: 11.6 mg/dL (ref 0.0–40.0)

## 2022-07-13 LAB — CBC WITH DIFFERENTIAL/PLATELET
Basophils Absolute: 0 10*3/uL (ref 0.0–0.1)
Basophils Relative: 0.7 % (ref 0.0–3.0)
Eosinophils Absolute: 0.1 10*3/uL (ref 0.0–0.7)
Eosinophils Relative: 1.4 % (ref 0.0–5.0)
HCT: 46.4 % (ref 39.0–52.0)
Hemoglobin: 15.6 g/dL (ref 13.0–17.0)
Lymphocytes Relative: 24.6 % (ref 12.0–46.0)
Lymphs Abs: 1.4 10*3/uL (ref 0.7–4.0)
MCHC: 33.6 g/dL (ref 30.0–36.0)
MCV: 97.5 fl (ref 78.0–100.0)
Monocytes Absolute: 0.9 10*3/uL (ref 0.1–1.0)
Monocytes Relative: 15.7 % — ABNORMAL HIGH (ref 3.0–12.0)
Neutro Abs: 3.3 10*3/uL (ref 1.4–7.7)
Neutrophils Relative %: 57.6 % (ref 43.0–77.0)
Platelets: 218 10*3/uL (ref 150.0–400.0)
RBC: 4.75 Mil/uL (ref 4.22–5.81)
RDW: 13.3 % (ref 11.5–15.5)
WBC: 5.7 10*3/uL (ref 4.0–10.5)

## 2022-07-13 LAB — COMPREHENSIVE METABOLIC PANEL
ALT: 13 U/L (ref 0–53)
AST: 22 U/L (ref 0–37)
Albumin: 4.4 g/dL (ref 3.5–5.2)
Alkaline Phosphatase: 59 U/L (ref 39–117)
BUN: 9 mg/dL (ref 6–23)
CO2: 31 mEq/L (ref 19–32)
Calcium: 9.5 mg/dL (ref 8.4–10.5)
Chloride: 101 mEq/L (ref 96–112)
Creatinine, Ser: 0.72 mg/dL (ref 0.40–1.50)
GFR: 90.04 mL/min (ref >60.00)
Glucose, Bld: 95 mg/dL (ref 70–99)
Potassium: 4.8 mEq/L (ref 3.5–5.1)
Sodium: 140 mEq/L (ref 135–145)
Total Bilirubin: 2 mg/dL — ABNORMAL HIGH (ref 0.2–1.2)
Total Protein: 7.2 g/dL (ref 6.0–8.3)

## 2022-07-13 LAB — TSH: TSH: 4.22 u[IU]/mL (ref 0.35–5.50)

## 2022-07-13 MED ORDER — LEVOTHYROXINE SODIUM 100 MCG PO TABS: 100.0000 ug | ORAL_TABLET | Freq: Every day | ORAL | 2 refills | Status: DC

## 2022-07-13 MED ORDER — RIVAROXABAN 20 MG PO TABS: ORAL_TABLET | ORAL | 2 refills | Status: DC

## 2022-07-13 MED ORDER — AMLODIPINE BESYLATE 5 MG PO TABS: 5.0000 mg | ORAL_TABLET | Freq: Every day | ORAL | 2 refills | Status: DC

## 2022-07-13 NOTE — Progress Notes (Signed)
Subjective:  Patient ID: Tyler Randall, male    DOB: 07-11-1945  Age: 77 y.o. MRN: [id number]  CC:  Chief Complaint  Patient presents with   Hypothyroidism    HPI Tyler Randall presents for   Med review.   Hypothyroidism: Lab Results  Component Value Date   TSH 3.22 03/08/2022  Taking medication daily.  Synthroid 100 mcg daily No new hot or cold intolerance. No new hair or skin changes, heart palpitations or new fatigue.No new weight changes.   History of DVT/PE With history of hypercoagulable state, history of polycythemia secondary to COPD.  Continue on Xarelto indefinitely with previous hematology eval.  Option of bone marrow biopsy if significant increase in hematocrit, continues on Xarelto without complaints of new bleeding. No CP, calf pain or shortness of breath.   Hypertension: Amlodipine 5 mg daily.  Alcohol intake decreased at his last visit from 5 to 6/day to 3 to 4/day.  Continued cutting back of alcohol intake recommended.  Currently 4-5 per day. Denies difficulty cutting back or need for assistance. No n/v/abd pain/jaundice.  Home readings: rare BP Readings from Last 3 Encounters:  07/13/22 122/68  04/27/22 130/80  03/08/22 128/70   Lab Results  Component Value Date   CREATININE 0.80 04/27/2022    Hyperlipidemia: No meds. Elevated total last visit.    Lab Results  Component Value Date   CHOL 211 (H) 03/08/2022   HDL 107.60 03/08/2022   LDLCALC 91 03/08/2022   TRIG 61.0 03/08/2022   CHOLHDL 2 03/08/2022   Lab Results  Component Value Date   ALT 14 03/08/2022   AST 22 03/08/2022   ALKPHOS 57 03/08/2022   BILITOT 1.5 (H) 03/08/2022    Rectal polyp with rectal bleeding - surgery in January for tubular adenoma, no carcinoma or high grade dysplasia on path.  Dr. Maisie Fushomas. Last visit 4/1 noted. Doing well - follow up as needed.   Had skin CA removed in January on upper lip - Dr. Irene LimboGoodrich, recovering well.  Retinal tear last week - procedure with  retina specialist - follow up in 2 weeks.       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   Deep vein thrombosis (DVT) of proximal vein of left lower extremity 03/02/2019   Pulmonary embolus 03/02/2019   Prostate cancer 03/27/2014   Unspecified hypothyroidism 10/06/2013   Unspecified vitamin D deficiency 10/06/2013   Thyroid condition 11/28/2011   Past Medical History:  Diagnosis Date   AAA (abdominal aortic aneurysm)    followed by dr Laneta Simmersbartle--  per last CT epic 4.6cm   Anticoagulant long-term use    xarelto--- managed by pcp   BPH associated with nocturia    COPD (chronic obstructive pulmonary disease)    Depression    History of adenomatous polyp of colon    History of DVT of lower extremity 11/2018   LLE   History of low-risk melanoma 2003   s/p  WLE back superficial   History of nonmelanoma skin cancer    left arm   History of prostate cancer 2003   s/p  radioactive seed implants   History of pulmonary embolism 11/2018   previously followed by dr Candise Chekale (hematology)  per pt negative work-up   Hypertension    Hypothyroidism    followed by pcp   Lesion of lip    04-25-2022  per pt is scheduled for moh's surgery in feb.,  stated not melanoma  Polycythemia    secondary ;  followed by pcp   Rectal polyp    Wears glasses    Past Surgical History:  Procedure Laterality Date   COLONOSCOPY WITH PROPOFOL  01/24/2022   dr Myrtie Neither   MELANOMA EXCISION  2003   WLE back   RADIOACTIVE SEED IMPLANT  2003   prostate   TONSILLECTOMY     child   TRANSANAL RECTAL RESECTION N/A 04/27/2022   Procedure: TRANSANAL RESECTION rectal polyp;  Surgeon: Romie Levee, MD;  Location: [facility];  Service: General;  Laterality: N/A;   Allergies  Allergen Reactions   Microplegia Msa-Msg [Cardioplegia Del Nido Formula] Hives and Swelling    M.S.G. in food.   Selenium Gluconate [Selenium] Anaphylaxis and Hives   Latex Other  (See Comments)    With prolonged exposure   Sulfa Antibiotics Hives    Not sure reaction, started in infancy   Prior to Admission medications   Medication Sig Start Date End Date Taking? Authorizing Provider  amLODipine (NORVASC) 5 MG tablet Take 1 tablet (5 mg total) by mouth daily. Patient taking differently: Take 5 mg by mouth daily with lunch. 03/08/22  Yes Shade Flood, MD  levothyroxine (SYNTHROID) 100 MCG tablet Take 1 tablet (100 mcg total) by mouth daily. Patient taking differently: Take 100 mcg by mouth daily. 03/08/22  Yes Shade Flood, MD  Multiple Vitamins-Minerals (CENTRUM SILVER PO) Take 1 tablet by mouth daily after lunch.   Yes [provider]  rivaroxaban (XARELTO) 20 MG TABS tablet TAKE 1 TABLET (20 MG TOTAL) BY MOUTH DAILY WITH SUPPER. Patient taking differently: Take 20 mg by mouth daily with lunch. TAKE 1 TABLET (20 MG TOTAL) BY MOUTH DAILY WITH SUPPER. 03/08/22  Yes Shade Flood, MD  tadalafil (CIALIS) 10 MG tablet Take 1 tablet (10 mg total) by mouth every other day as needed for erectile dysfunction. Patient taking differently: Take 10 mg by mouth every other day as needed for erectile dysfunction. 03/08/22  Yes Shade Flood, MD  diphenhydrAMINE (BENADRYL) 50 MG tablet Take 50 mg by mouth at bedtime as needed for sleep. Patient not taking: Reported on 07/13/2022    [provider]  traMADol (ULTRAM) 50 MG tablet Take 1 tablet (50 mg total) by mouth every 6 (six) hours as needed. Patient not taking: Reported on 07/13/2022 04/27/22   Romie Levee, MD   Social History   Socioeconomic History   Marital status: Divorced    Spouse name: Not on file   Number of children: 0   Years of education: Not on file   Highest education level: Not on file  Occupational History   Occupation: retired  Tobacco Use   Smoking status: Former    Packs/day: 3.50    Years: 50.00    Additional pack years: 0.00    Total pack years: 175.00    Types:  Cigarettes    Start date: 62    Quit date: 02/03/2016    Years since quitting: 6.4   Smokeless tobacco: Never   Tobacco comments:    cut down to 1 pack when quit in 2017  Vaping Use   Vaping Use: Never used  Substance and Sexual Activity   Alcohol use: Yes    Alcohol/week: 20.0 standard drinks of alcohol    Types: 20 Cans of beer per week   Drug use: Never   Sexual activity: Yes  Other Topics Concern   Not on file  Social History Narrative  Not on file   Social Determinants of Health   Financial Resource Strain: Not on file  Food Insecurity: Not on file  Transportation Needs: Not on file  Physical Activity: Not on file  Stress: Not on file  Social Connections: Not on file  Intimate Partner Violence: Not on file    Review of Systems Per HPI  Objective:   Vitals:   07/13/22 1104  BP: 122/68  Pulse: 79  Temp: 98.2 F (36.8 C)  TempSrc: Temporal  SpO2: 98%  Weight: 142 lb 6.4 oz (64.6 kg)  Height:  (1.88 m)     Physical Exam Vitals reviewed.  Constitutional:      Appearance: He is well-developed.  HENT:     Head: Normocephalic and atraumatic.  Neck:     Vascular: No carotid bruit or JVD.  Cardiovascular:     Rate and Rhythm: Normal rate and regular rhythm.     Heart sounds: Normal heart sounds. No murmur heard. Pulmonary:     Effort: Pulmonary effort is normal.     Breath sounds: Normal breath sounds. No rales.  Abdominal:     General: Abdomen is flat. There is no distension.     Tenderness: There is no abdominal tenderness.  Musculoskeletal:     Right lower leg: No edema.     Left lower leg: No edema.  Skin:    General: Skin is warm and dry.  Neurological:     Mental Status: He is alert and oriented to person, place, and time.  Psychiatric:        Mood and Affect: Mood normal.        Assessment & Plan:  ARGEL PABLO is a 77 y.o. male . Thyroid condition - Plan: TSH Hypothyroidism, unspecified type - Plan: TSH, levothyroxine  (SYNTHROID) 100 MCG tablet  -Tolerating current regimen, check updated TSH, continue same dose Synthroid for now  Polycythemia vera - Plan: CBC w/Diff  -Check CBC with adjustment of plan accordingly, possible need for bone marrow biopsy if significant increase in hematocrit.  Essential hypertension - Plan: Comp Met (CMET), amLODipine (NORVASC) 5 MG tablet  -Stable, continue same med regimen with labs pending. Recommended further decrease in alcohol intake.  Elevated cholesterol - Plan: Lipid panel  -Check lipids, ASCVD risk scoring to determine need for statin/medication recommendations.  Multiple subsegmental pulmonary emboli without acute cor pulmonale - Plan: rivaroxaban (XARELTO) 20 MG TABS tablet  -Denies any bleeding, tolerating Xarelto without signs or symptoms of new DVT/PE.  Meds ordered this encounter  Medications   amLODipine (NORVASC) 5 MG tablet    Sig: Take 1 tablet (5 mg total) by mouth daily.    Dispense:  90 tablet    Refill:  2   levothyroxine (SYNTHROID) 100 MCG tablet    Sig: Take 1 tablet (100 mcg total) by mouth daily.    Dispense:  90 tablet    Refill:  2   rivaroxaban (XARELTO) 20 MG TABS tablet    Sig: TAKE 1 TABLET (20 MG TOTAL) BY MOUTH DAILY WITH SUPPER.    Dispense:  90 tablet    Refill:  2   Patient Instructions  Thanks for coming in today.  I will let you know if there is any concerns on labs.  Try to cut back by a few drinks per day if possible.  No change in meds for now.  Glad to hear that you are recovering from the previous surgeries/procedures.  Hang in there.  Follow-up  me in 7 months for physical but I am happy to see you sooner if needed.    Signed,   Meredith Staggers, MD [facility], [facility] 07/13/22 12:02 PM

## 2022-07-13 NOTE — Patient Instructions (Signed)
Thanks for coming in today.  I will let you know if there is any concerns on labs.  Try to cut back by a few drinks per day if possible.  No change in meds for now.  Glad to hear that you are recovering from the previous surgeries/procedures.  Hang in there.  Follow-up me in 7 months for physical but I am happy to see you sooner if needed.

## 2022-07-14 ENCOUNTER — Encounter: Payer: Self-pay | Admitting: Family Medicine

## 2022-07-24 ENCOUNTER — Other Ambulatory Visit: Payer: Self-pay | Admitting: Family Medicine

## 2022-07-24 DIAGNOSIS — R17 Unspecified jaundice: Secondary | ICD-10-CM

## 2022-07-24 NOTE — Progress Notes (Signed)
See labs 

## 2022-11-21 ENCOUNTER — Other Ambulatory Visit: Payer: Self-pay | Admitting: Surgery

## 2022-11-21 DIAGNOSIS — I7121 Aneurysm of the ascending aorta, without rupture: Secondary | ICD-10-CM

## 2022-12-14 ENCOUNTER — Encounter: Payer: Self-pay | Admitting: Surgery

## 2022-12-27 ENCOUNTER — Ambulatory Visit
Admission: RE | Admit: 2022-12-27 | Discharge: 2022-12-27 | Disposition: A | Discharge status: Home / Self Care | Payer: Medicare HMO | Source: Ambulatory Visit | Attending: Surgery | Admitting: Surgery

## 2022-12-27 ENCOUNTER — Encounter: Payer: Self-pay | Admitting: Surgery

## 2022-12-27 ENCOUNTER — Ambulatory Visit (INDEPENDENT_AMBULATORY_CARE_PROVIDER_SITE_OTHER): Payer: Medicare HMO | Admitting: Surgery

## 2022-12-27 DIAGNOSIS — I7121 Aneurysm of the ascending aorta, without rupture: Secondary | ICD-10-CM

## 2022-12-27 MED ORDER — IOPAMIDOL (ISOVUE-370) INJECTION 76%
75.0000 mL | Freq: Once | INTRAVENOUS | Status: AC | PRN
  Administered 2022-12-27: 75 mL via INTRAVENOUS

## 2022-12-27 NOTE — Progress Notes (Signed)
HPI:  The patient is a 77 year old gentleman with a history of hypertension who returns for follow-up of a 4.6 cm fusiform ascending aortic aneurysm that has been stable dating back to 2020.  He continues to feel well without chest pain or shortness of breath.  Current Outpatient Medications  Medication Sig Dispense Refill   amLODipine (NORVASC) 5 MG tablet Take 1 tablet (5 mg total) by mouth daily. 90 tablet 2   levothyroxine (SYNTHROID) 100 MCG tablet Take 1 tablet (100 mcg total) by mouth daily. 90 tablet 2   Multiple Vitamins-Minerals (CENTRUM SILVER PO) Take 1 tablet by mouth daily after lunch.     rivaroxaban (XARELTO) 20 MG TABS tablet TAKE 1 TABLET (20 MG TOTAL) BY MOUTH DAILY WITH SUPPER. 90 tablet 2   tadalafil (CIALIS) 10 MG tablet Take 1 tablet (10 mg total) by mouth every other day as needed for erectile dysfunction. (Patient taking differently: Take 10 mg by mouth every other day as needed for erectile dysfunction.) 10 tablet 11   No current facility-administered medications for this visit.     Physical Exam: BP 120/80, Pulse 70 He looks well. Cardiac exam shows a regular rate and rhythm with normal heart sounds.  There is no murmur. Lungs are clear.   Diagnostic Tests:  Narrative & Impression  CLINICAL DATA:  anuerysm of ascending aortic   EXAM: CT ANGIOGRAPHY CHEST WITH CONTRAST   TECHNIQUE: Multidetector CT imaging of the chest was performed using the standard protocol during bolus administration of intravenous contrast. Multiplanar CT image reconstructions and MIPs were obtained to evaluate the vascular anatomy.   RADIATION DOSE REDUCTION: This exam was performed according to the departmental dose-optimization program which includes automated exposure control, adjustment of the mA and/or kV according to patient size and/or use of iterative reconstruction technique.   CONTRAST:  75mL ISOVUE-370 IOPAMIDOL (ISOVUE-370) INJECTION 76%   COMPARISON:  CTA  Chest 12/14/21   FINDINGS: Cardiovascular: Satisfactory opacification of the pulmonary arteries to the segmental level. No evidence of pulmonary embolism. Normal heart size. No pericardial effusion. No significant interval change in size of the ascending thoracic aortic aneurysm measuring up to 4.5 x 4.5 cm.   Mediastinum/Nodes: No enlarged mediastinal, hilar, or axillary lymph nodes. Thyroid gland, trachea, and esophagus demonstrate no significant findings.   Lungs/Pleura: No pleural effusion. No pneumothorax. Unchanged linear perifissural opacity in the right lower lobe (series 4, image 146), possibly representing a site of rounded atelectasis. Unchanged 6 mm pleural nodule in the right lower lobe (series 4, image 74). Unchanged 3 mm pulmonary nodule in the left upper lobe (series 4, image 28).   Upper Abdomen: No acute abnormality.   Musculoskeletal: No chest wall abnormality. No acute or significant osseous findings.   Review of the MIP images confirms the above findings.   IMPRESSION: No significant interval change in size of the ascending thoracic aortic aneurysm measuring up to 4.5 cm.   Recommend semi-annual imaging followup by CTA or MRA and referral to cardiothoracic surgery if not already obtained. This recommendation follows 2010 ACCF/AHA/AATS/ACR/ASA/SCA/SCAI/SIR/STS/SVM Guidelines for the Diagnosis and Management of Patients With Thoracic Aortic disease. Circulation. 2010; 121: Q657-Q469. Aortic aneurysm NOS (ICD10-I71.9)     Electronically Signed   By: Lorenza Cambridge M.D.   On: 12/27/2022 09:18      Impression:  He has a stable 4.5 cm fusiform ascending aortic aneurysm that has not changed since 2020.  This is well below the surgical threshold of 5.5 cm.  I reviewed  the CTA images with him and answered his questions.  I stressed the importance of continued good blood pressure control in preventing further enlargement and acute aortic dissection.  I advised  him to check his blood pressure periodically at home with a goal of 130/80 or less.  I advised him against doing any heavy lifting that may require a Valsalva maneuver and could suddenly raise his blood pressure to high levels.  Plan:  He will return to see me in 1 year with a CTA of the chest for aortic surveillance.  I spent 15 minutes performing this established patient evaluation and > 50% of this time was spent face to face counseling and coordinating the care of this patient's aortic aneurysm.    Alleen Borne, MD [facility] [phone]

## 2023-02-15 ENCOUNTER — Encounter: Payer: Medicare HMO | Admitting: Family Medicine

## 2023-02-22 ENCOUNTER — Encounter: Payer: Self-pay | Admitting: Family Medicine

## 2023-02-22 ENCOUNTER — Telehealth: Payer: Self-pay

## 2023-02-22 ENCOUNTER — Ambulatory Visit: Payer: Medicare HMO | Admitting: Family Medicine

## 2023-02-22 VITALS — BP 132/76 | HR 78 | Temp 98.2°F | Ht 73.0 in | Wt 134.6 lb

## 2023-02-22 DIAGNOSIS — E039 Hypothyroidism, unspecified: Secondary | ICD-10-CM

## 2023-02-22 DIAGNOSIS — R319 Hematuria, unspecified: Secondary | ICD-10-CM | POA: Diagnosis not present

## 2023-02-22 DIAGNOSIS — Z0000 Encounter for general adult medical examination without abnormal findings: Secondary | ICD-10-CM | POA: Diagnosis not present

## 2023-02-22 DIAGNOSIS — I10 Essential (primary) hypertension: Secondary | ICD-10-CM | POA: Diagnosis not present

## 2023-02-22 DIAGNOSIS — Z131 Encounter for screening for diabetes mellitus: Secondary | ICD-10-CM

## 2023-02-22 DIAGNOSIS — I2694 Multiple subsegmental pulmonary emboli without acute cor pulmonale: Secondary | ICD-10-CM

## 2023-02-22 DIAGNOSIS — D751 Secondary polycythemia: Secondary | ICD-10-CM

## 2023-02-22 DIAGNOSIS — Z13220 Encounter for screening for lipoid disorders: Secondary | ICD-10-CM | POA: Diagnosis not present

## 2023-02-22 DIAGNOSIS — C61 Malignant neoplasm of prostate: Secondary | ICD-10-CM | POA: Diagnosis not present

## 2023-02-22 LAB — CBC WITH DIFFERENTIAL/PLATELET
Basophils Absolute: 0 10*3/uL (ref 0.0–0.1)
Basophils Relative: 0.6 % (ref 0.0–3.0)
Eosinophils Absolute: 0 10*3/uL (ref 0.0–0.7)
Eosinophils Relative: 0.9 % (ref 0.0–5.0)
HCT: 56.5 % — ABNORMAL HIGH (ref 39.0–52.0)
Hemoglobin: 18.7 g/dL (ref 13.0–17.0)
Lymphocytes Relative: 20.7 % (ref 12.0–46.0)
Lymphs Abs: 1.1 10*3/uL (ref 0.7–4.0)
MCHC: 33.1 g/dL (ref 30.0–36.0)
MCV: 99.1 fL (ref 78.0–100.0)
Monocytes Absolute: 0.8 10*3/uL (ref 0.1–1.0)
Monocytes Relative: 15.6 % — ABNORMAL HIGH (ref 3.0–12.0)
Neutro Abs: 3.2 10*3/uL (ref 1.4–7.7)
Neutrophils Relative %: 62.2 % (ref 43.0–77.0)
Platelets: 227 10*3/uL (ref 150.0–400.0)
RBC: 5.7 Mil/uL (ref 4.22–5.81)
RDW: 13.9 % (ref 11.5–15.5)
WBC: 5.1 10*3/uL (ref 4.0–10.5)

## 2023-02-22 LAB — POCT URINALYSIS DIPSTICK
Bilirubin, UA: NEGATIVE
Blood, UA: NEGATIVE
Glucose, UA: NEGATIVE
Ketones, UA: NEGATIVE
Nitrite, UA: NEGATIVE
Protein, UA: POSITIVE — AB
Spec Grav, UA: 1.015 (ref 1.010–1.025)
Urobilinogen, UA: 0.2 U/dL
pH, UA: 6.5 (ref 5.0–8.0)

## 2023-02-22 LAB — HEMOGLOBIN A1C: Hgb A1c MFr Bld: 5.2 % (ref 4.6–6.5)

## 2023-02-22 LAB — LIPID PANEL
Cholesterol: 239 mg/dL — ABNORMAL HIGH (ref 0–200)
HDL: 106.4 mg/dL (ref >39.00)
LDL Cholesterol: 119 mg/dL — ABNORMAL HIGH (ref 0–99)
NonHDL: 132.31
Total CHOL/HDL Ratio: 2
Triglycerides: 69 mg/dL (ref 0.0–149.0)
VLDL: 13.8 mg/dL (ref 0.0–40.0)

## 2023-02-22 LAB — COMPREHENSIVE METABOLIC PANEL
ALT: 18 U/L (ref 0–53)
AST: 27 U/L (ref 0–37)
Albumin: 4.7 g/dL (ref 3.5–5.2)
Alkaline Phosphatase: 65 U/L (ref 39–117)
BUN: 10 mg/dL (ref 6–23)
CO2: 32 meq/L (ref 19–32)
Calcium: 10 mg/dL (ref 8.4–10.5)
Chloride: 98 meq/L (ref 96–112)
Creatinine, Ser: 0.78 mg/dL (ref 0.40–1.50)
GFR: 87.52 mL/min (ref >60.00)
Glucose, Bld: 87 mg/dL (ref 70–99)
Potassium: 4.9 meq/L (ref 3.5–5.1)
Sodium: 139 meq/L (ref 135–145)
Total Bilirubin: 2 mg/dL — ABNORMAL HIGH (ref 0.2–1.2)
Total Protein: 7.6 g/dL (ref 6.0–8.3)

## 2023-02-22 LAB — PSA, MEDICARE: PSA: 3.01 ng/mL (ref 0.10–4.00)

## 2023-02-22 LAB — TSH: TSH: 5.54 u[IU]/mL — ABNORMAL HIGH (ref 0.35–5.50)

## 2023-02-22 MED ORDER — LEVOTHYROXINE SODIUM 100 MCG PO TABS: 100.0000 ug | ORAL_TABLET | Freq: Every day | ORAL | 2 refills | Status: DC

## 2023-02-22 MED ORDER — RIVAROXABAN 20 MG PO TABS: ORAL_TABLET | ORAL | 2 refills | Status: DC

## 2023-02-22 MED ORDER — AMLODIPINE BESYLATE 5 MG PO TABS: 5.0000 mg | ORAL_TABLET | Freq: Every day | ORAL | 2 refills | Status: DC

## 2023-02-22 NOTE — Progress Notes (Signed)
Subjective:  Patient ID: Tyler Randall, male    DOB: Mar 21, 1946  Age: 76 y.o. MRN: [id number]  CC:  Chief Complaint  Patient presents with   Annual Exam    Pt doing okay, last "Sunday had a bit of blood in his urine denies pain discomfort would like to be checked, pt is fasting, discuss RSV     HPI Tyler Randall presents for Annual Exam And concern of hematuria episode as above. Doing well otherwise.   PCP, me Vascular surgeon, Dr. Bartle, ascending aorta aneurysm, appointment September 18.  Stable 4.5 cm aneurysm, below surgical threshold of 5.5 cm.  1 year follow-up CTA planned. General surgery, Dr. Abayomi, history of rectal bleeding, adenomatous rectal polyp, transanal resection of rectal polyp on January 18. Some discomfort, but 99% better at this time.  Gastroenterology, Dr. Danis Dermatology, Dr. Dan Jones Previous urologist Dr. Grapey, then Dr. Wrenn. seed implant for prostate cancer in 2003, yearly PSAs through our office. 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  On Xarelto indefinitely with previous evaluation by hematology.  Option of bone marrow biopsy if significant increase in hematocrit.  Takes Xarelto 20 mg daily.  Denies chest pain or dyspnea.  Did have a single episode of hematuria last Sunday at end of stream. Red drop of blood at tip of penis. No recurrence. No other new urinary symptoms. No new bruising or bleeding otherwise.   Hypertension: Treated with amlodipine 5 mg daily.  Cut back on alcohol use previously, impact on blood pressure has been discussed. 4-5 beers per day - no recent changes and does not plan on cutting back. Declines need for assistance if needed.  Home readings: 125-130/60's.  BP Readings from Last 3 Encounters:  02/22/23 132/76   07/13/22 122/68  04/27/22 130/80   Lab Results  Component Value Date   CREATININE 0.78 02/22/2023   Hypothyroidism: Lab Results  Component Value Date   TSH 5.54 (H) 02/22/2023  Synthroid 100 mcg daily. Taking medication daily.  No new hot or cold intolerance. No new hair or skin changes, heart palpitations or new fatigue. No new weight changes.   Erectile dysfunction Treated with Cialis, effective with 10 mg dose previously.  No headache, flushing, chest pain or dyspnea with exertion or hearing/vision changes. Still doing well without new side effects.       11" /14/2024   10:20 AM 07/13/2022   11:08 AM 03/08/2022   11:01 AM 11/09/2021    1:06 PM 02/04/2021    9:16 AM  Depression screen PHQ 2/9  Decreased Interest 0 0 0 0 0  Down, Depressed, Hopeless 0 0 0 0 0  PHQ - 2 Score 0 0 0 0 0  Altered sleeping 0 0 0 0   Tired, decreased energy 0 0 0 0   Change in appetite 0 0 0 0   Feeling bad or failure about yourself  0 0 0 0   Trouble concentrating 0 0 0 0   Moving slowly or fidgety/restless 0 0 0 0   Suicidal thoughts 0 0 0 0   PHQ-9 Score 0 0 0 0     Health Maintenance  Topic Date Due   Medicare Annual Wellness (AWV)  02/04/2022   INFLUENZA VACCINE  11/09/2022  COVID-19 Vaccine (6 - 2023-24 season) 12/10/2022   Lung Cancer Screening  12/27/2023   Colonoscopy  01/24/2025   DTaP/Tdap/Td (2 - Td or Tdap) 11/10/2026   Pneumonia Vaccine 89+ Years old  Completed   Hepatitis C Screening  Completed   Zoster Vaccines- Shingrix  Completed   HPV VACCINES  Aged Out  Colon - under care of GI with rectal polyp removal with surgery above. Colonoscopy in 01/2022.  Prostate:as above - history of seed implant for prostate CA in 2003. Single episode hematuria recently as above.  Derm appt next month.  Lab Results  Component Value Date   PSA1 0.9 05/16/2019   PSA1 1.4 11/09/2016   PSA 3.01 02/22/2023   PSA 2.04 03/08/2022   PSA 1.68 02/04/2021    Immunization History   Administered Date(s) Administered   Fluad Quad(high Dose 65+) 01/10/2019, 02/05/2020, 02/04/2021, 12/20/2021   Influenza Split 02/07/2012   Influenza, High Dose Seasonal PF 01/25/2017, 01/02/2018, 01/13/2022, 02/10/2022   Influenza,inj,Quad PF,6+ Mos 02/20/2013, 03/27/2014   Influenza-Unspecified 01/09/2016, 01/02/2018, 01/27/2021   PFIZER(Purple Top)SARS-COV-2 Vaccination 06/06/2019, 07/01/2019, 01/17/2020, 07/08/2020, 01/13/2022   Pneumococcal Conjugate-13 06/04/2014   Pneumococcal Polysaccharide-23 11/09/2016   Tdap 11/09/2016   Zoster Recombinant(Shingrix) 07/16/2020, 09/22/2020    No results found. Optho - visit recently, new Rx. Retinal tear noted in January - saw retina specialist with laser surgery. Vision stable now.   Dental: every 4 months.   Alcohol: 4-5 drinks per day - beer only. [employer].   Tobacco: none. Quit in 2017. 50 years prior initially 2-5 ppd, then ppd for last 30 years. Unchanged nodules in RLL, LUL, perifissural opacity in RLL on CT in 12/2022.   Exercise: daily stretching and walking few days per week. 1 mile.    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   Deep vein thrombosis (DVT) of proximal vein of left lower extremity (HCC) 03/02/2019   Pulmonary embolus (HCC) 03/02/2019   Prostate cancer (HCC) 03/27/2014   Hypothyroidism 10/06/2013   Vitamin D deficiency 10/06/2013   Thyroid condition 11/28/2011   Past Medical History:  Diagnosis Date   AAA (abdominal aortic aneurysm) (HCC)    followed by dr Laneta Simmers--  per last CT epic 4.6cm   Anticoagulant long-term use    xarelto--- managed by pcp   BPH associated with nocturia    COPD (chronic obstructive pulmonary disease) (HCC)    Depression    History of adenomatous polyp of colon    History of DVT of lower extremity 11/2018   LLE   History of low-risk melanoma 2003   s/p  WLE back superficial   History of nonmelanoma skin cancer    left  arm   History of prostate cancer 2003   s/p  radioactive seed implants   History of pulmonary embolism 11/2018   previously followed by dr Candise Che (hematology)  per pt negative work-up   Hypertension    Hypothyroidism    followed by pcp   Lesion of lip    04-25-2022  per pt is scheduled for moh's surgery in feb.,  stated not melanoma   Polycythemia    secondary ;  followed by pcp   Rectal polyp    Wears glasses    Past Surgical History:  Procedure Laterality Date   COLONOSCOPY WITH PROPOFOL  01/24/2022   dr Myrtie Neither   MELANOMA EXCISION  2003   WLE back   RADIOACTIVE SEED IMPLANT  2003   prostate  RETINAL TEAR REPAIR CRYOTHERAPY Right 04/2022   TONSILLECTOMY     child   TRANSANAL RECTAL RESECTION N/A 04/27/2022   Procedure: TRANSANAL RESECTION rectal polyp;  Surgeon: Romie Levee, MD;  Location: [facility];  Service: General;  Laterality: N/A;   Allergies  Allergen Reactions   Microplegia Msa-Msg [Cardioplegia Del Nido Formula] Hives and Swelling    M.S.G. in food.   Selenium Gluconate [Selenium] Anaphylaxis and Hives   Latex Other (See Comments)    With prolonged exposure   Sulfa Antibiotics Hives    Not sure reaction, started in infancy   Prior to Admission medications   Medication Sig Start Date End Date Taking? Authorizing Provider  amLODipine (NORVASC) 5 MG tablet Take 1 tablet (5 mg total) by mouth daily. 07/13/22  Yes Shade Flood, MD  levothyroxine (SYNTHROID) 100 MCG tablet Take 1 tablet (100 mcg total) by mouth daily. 07/13/22  Yes Shade Flood, MD  Multiple Vitamins-Minerals (CENTRUM SILVER PO) Take 1 tablet by mouth daily after lunch.   Yes [provider]  rivaroxaban (XARELTO) 20 MG TABS tablet TAKE 1 TABLET (20 MG TOTAL) BY MOUTH DAILY WITH SUPPER. 07/13/22  Yes Shade Flood, MD  tadalafil (CIALIS) 10 MG tablet Take 1 tablet (10 mg total) by mouth every other day as needed for erectile dysfunction. Patient taking differently:  Take 10 mg by mouth every other day as needed for erectile dysfunction. 03/08/22  Yes Shade Flood, MD   Social History   Socioeconomic History   Marital status: Divorced    Spouse name: Not on file   Number of children: 0   Years of education: Not on file   Highest education level: Not on file  Occupational History   Occupation: retired  Tobacco Use   Smoking status: Former    Current packs/day: 0.00    Average packs/day: 3.5 packs/day for 50.8 years (177.9 ttl pk-yrs)    Types: Cigarettes    Start date: 32    Quit date: 02/03/2016    Years since quitting: 7.0   Smokeless tobacco: Never   Tobacco comments:    cut down to 1 pack when quit in 2017  Vaping Use   Vaping status: Never Used  Substance and Sexual Activity   Alcohol use: Yes    Alcohol/week: 20.0 standard drinks of alcohol    Types: 20 Cans of beer per week   Drug use: Never   Sexual activity: Yes  Other Topics Concern   Not on file  Social History Narrative   Not on file   Social Determinants of Health   Financial Resource Strain: Not on file  Food Insecurity: Not on file  Transportation Needs: Not on file  Physical Activity: Not on file  Stress: Not on file  Social Connections: Not on file  Intimate Partner Violence: Not on file    Review of Systems 13 point review of systems per patient health survey noted.  Negative other than as indicated above or in HPI.    Objective:   Vitals:   02/22/23 1019  BP: 132/76  Pulse: 78  Temp: 98.2 F (36.8 C)  TempSrc: Temporal  SpO2: 99%  Weight: 134 lb 9.6 oz (61.1 kg)  Height: 6\' 1"  (1.854 m)     Physical Exam Vitals reviewed.  Constitutional:      Appearance: He is well-developed.  HENT:     Head: Normocephalic and atraumatic.     Right Ear: External ear normal.  Left Ear: External ear normal.  Eyes:     Conjunctiva/sclera: Conjunctivae normal.     Pupils: Pupils are equal, round, and reactive to light.  Neck:     Thyroid: No  thyromegaly.  Cardiovascular:     Rate and Rhythm: Normal rate and regular rhythm.     Heart sounds: Normal heart sounds.  Pulmonary:     Effort: Pulmonary effort is normal. No respiratory distress.     Breath sounds: Normal breath sounds. No wheezing.  Abdominal:     General: There is no distension.     Palpations: Abdomen is soft. There is no mass.     Tenderness: There is no abdominal tenderness. There is no guarding.  Musculoskeletal:        General: No tenderness. Normal range of motion.     Cervical back: Normal range of motion and neck supple.  Lymphadenopathy:     Cervical: No cervical adenopathy.  Skin:    General: Skin is warm and dry.  Neurological:     Mental Status: He is alert and oriented to person, place, and time.     Deep Tendon Reflexes: Reflexes are normal and symmetric.  Psychiatric:        Behavior: Behavior normal.     Results for orders placed or performed in visit on 02/22/23  Comprehensive metabolic panel  Result Value Ref Range   Sodium 139 135 - 145 mEq/L   Potassium 4.9 3.5 - 5.1 mEq/L   Chloride 98 96 - 112 mEq/L   CO2 32 19 - 32 mEq/L   Glucose, Bld 87 70 - 99 mg/dL   BUN 10 6 - 23 mg/dL   Creatinine, Ser 9.56 0.40 - 1.50 mg/dL   Total Bilirubin 2.0 (H) 0.2 - 1.2 mg/dL   Alkaline Phosphatase 65 39 - 117 U/L   AST 27 0 - 37 U/L   ALT 18 0 - 53 U/L   Total Protein 7.6 6.0 - 8.3 g/dL   Albumin 4.7 3.5 - 5.2 g/dL   GFR 21.30 >86.57 mL/min   Calcium 10.0 8.4 - 10.5 mg/dL  Hemoglobin Q4O  Result Value Ref Range   Hgb A1c MFr Bld 5.2 4.6 - 6.5 %  Lipid panel  Result Value Ref Range   Cholesterol 239 (H) 0 - 200 mg/dL   Triglycerides 96.2 0.0 - 149.0 mg/dL   HDL 952.84 >13.24 mg/dL   VLDL 40.1 0.0 - 02.7 mg/dL   LDL Cholesterol 253 (H) 0 - 99 mg/dL   Total CHOL/HDL Ratio 2    NonHDL 132.31   PSA, Medicare  Result Value Ref Range   PSA 3.01 0.10 - 4.00 ng/ml  CBC with Differential/Platelet  Result Value Ref Range   WBC 5.1 4.0 - 10.5  K/uL   RBC 5.70 4.22 - 5.81 Mil/uL   Hemoglobin 18.7 Repeated and verified X2. (HH) 13.0 - 17.0 g/dL   HCT 66.4 Repeated and verified X2. (H) 39.0 - 52.0 %   MCV 99.1 78.0 - 100.0 fl   MCHC 33.1 30.0 - 36.0 g/dL   RDW 40.3 47.4 - 25.9 %   Platelets 227.0 150.0 - 400.0 K/uL   Neutrophils Relative % 62.2 43.0 - 77.0 %   Lymphocytes Relative 20.7 12.0 - 46.0 %   Monocytes Relative 15.6 (H) 3.0 - 12.0 %   Eosinophils Relative 0.9 0.0 - 5.0 %   Basophils Relative 0.6 0.0 - 3.0 %   Neutro Abs 3.2 1.4 - 7.7 K/uL   Lymphs  Abs 1.1 0.7 - 4.0 K/uL   Monocytes Absolute 0.8 0.1 - 1.0 K/uL   Eosinophils Absolute 0.0 0.0 - 0.7 K/uL   Basophils Absolute 0.0 0.0 - 0.1 K/uL  TSH  Result Value Ref Range   TSH 5.54 (H) 0.35 - 5.50 uIU/mL  POCT Urinalysis Dipstick  Result Value Ref Range   Color, UA yellow    Clarity, UA clear    Glucose, UA Negative Negative   Bilirubin, UA Neg    Ketones, UA Neg    Spec Grav, UA 1.015 1.010 - 1.025   Blood, UA Neg    pH, UA 6.5 5.0 - 8.0   Protein, UA Positive (A) Negative   Urobilinogen, UA 0.2 0.2 or 1.0 E.U./dL   Nitrite, UA Neg    Leukocytes, UA Trace (A) Negative   Appearance     Odor        Assessment & Plan:  Tyler Randall is a 77 y.o. male . Annual physical exam  - -anticipatory guidance as below in AVS, screening labs above. Health maintenance items as above in HPI discussed/recommended as applicable.   Hypothyroidism, unspecified type - Plan: TSH, levothyroxine (SYNTHROID) 100 MCG tablet  -Tolerating Synthroid, TSH obtained today was borderline, plan on lab only visit in 6 weeks.  Essential hypertension - Plan: CBC with Differential/Platelet, amLODipine (NORVASC) 5 MG tablet  -Stable with current regimen, labs obtained, stable electrolytes.  Continue same dose amlodipine.  Prostate cancer [facility]) - Plan: PSA, Medicare, Ambulatory referral to Urology  -With history of seed implant.  PSAs appear to be uptrending, with repeat PSA today up from  last year.  Single episode of hematuria.  Will refer back to urology.  Screening for hyperlipidemia - Plan: Comprehensive metabolic panel, Lipid panel  -Slight increase cholesterol on labs today.  Reasonable to try diet approach initially with recheck levels in the next 6 months.  Screening for diabetes mellitus - Plan: Comprehensive metabolic panel, Hemoglobin A1c  Multiple subsegmental pulmonary emboli without acute cor pulmonale (HCC) - Plan: rivaroxaban (XARELTO) 20 MG TABS tablet  -History of DVT/PE, tolerating Xarelto with only single episode of brief hematuria as above.  No changes for now.  Hematuria, unspecified type - Plan: PSA, Medicare, POCT Urinalysis Dipstick, Ambulatory referral to Urology, POCT urinalysis dipstick, Urine Culture  -Brief symptoms few days ago.  Plan for urology follow-up as above with increasing PSA, history of prostate cancer, although still within normal range.  RTC precautions if new urinary symptoms.  Check urine culture with leukocytes noted in point-of-care urine.  Polycythemia Check lab result reviewed after visit.  Hemoglobin 18.7.  Has been high in the past with his polycythemia, plan for follow-up with hematology if increasing for possible therapeutic phlebotomy or bone marrow biopsy.  Will repeat CBC tomorrow to verify results.  Patient advised.  Meds ordered this encounter  Medications   amLODipine (NORVASC) 5 MG tablet    Sig: Take 1 tablet (5 mg total) by mouth daily.    Dispense:  90 tablet    Refill:  2   levothyroxine (SYNTHROID) 100 MCG tablet    Sig: Take 1 tablet (100 mcg total) by mouth daily.    Dispense:  90 tablet    Refill:  2   rivaroxaban (XARELTO) 20 MG TABS tablet    Sig: TAKE 1 TABLET (20 MG TOTAL) BY MOUTH DAILY WITH SUPPER.    Dispense:  90 tablet    Refill:  2   Patient Instructions  I will  check labs today including the prostate test but I have referred you back to urology given the history of prostate cancer and recent  blood in the urine.  PSAs although normal may be trending up slightly, and can be discussed with urology as well.  Again I am checking that lab today.  If any other concerns on labs I will let you know.  No medication changes at this time.  Recheck in 6 months but let me know if there are any questions or concerns sooner.  Take care!  Preventive Care 6 Years and Older, Male Preventive care refers to lifestyle choices and visits with your health care provider that can promote health and wellness. Preventive care visits are also called wellness exams. What can I expect for my preventive care visit? Counseling During your preventive care visit, your health care provider may ask about your: Medical history, including: Past medical problems. Family medical history. History of falls. Current health, including: Emotional well-being. Home life and relationship well-being. Sexual activity. Memory and ability to understand (cognition). Lifestyle, including: Alcohol, nicotine or tobacco, and drug use. Access to firearms. Diet, exercise, and sleep habits. Work and work Astronomer. Sunscreen use. Safety issues such as seatbelt and bike helmet use. Physical exam Your health care provider will check your: Height and weight. These may be used to calculate your BMI (body mass index). BMI is a measurement that tells if you are at a healthy weight. Waist circumference. This measures the distance around your waistline. This measurement also tells if you are at a healthy weight and may help predict your risk of certain diseases, such as type 2 diabetes and high blood pressure. Heart rate and blood pressure. Body temperature. Skin for abnormal spots. What immunizations do I need?  Vaccines are usually given at various ages, according to a schedule. Your health care provider will recommend vaccines for you based on your age, medical history, and lifestyle or other factors, such as travel or where you  work. What tests do I need? Screening Your health care provider may recommend screening tests for certain conditions. This may include: Lipid and cholesterol levels. Diabetes screening. This is done by checking your blood sugar (glucose) after you have not eaten for a while (fasting). Hepatitis C test. Hepatitis B test. HIV (human immunodeficiency virus) test. STI (sexually transmitted infection) testing, if you are at risk. Lung cancer screening. Colorectal cancer screening. Prostate cancer screening. Abdominal aortic aneurysm (AAA) screening. You may need this if you are a current or former smoker. Talk with your health care provider about your test results, treatment options, and if necessary, the need for more tests. Follow these instructions at home: Eating and drinking  Eat a diet that includes fresh fruits and vegetables, whole grains, lean protein, and low-fat dairy products. Limit your intake of foods with high amounts of sugar, saturated fats, and salt. Take vitamin and mineral supplements as recommended by your health care provider. Do not drink alcohol if your health care provider tells you not to drink. If you drink alcohol: Limit how much you have to 0-2 drinks a day. Know how much alcohol is in your drink. In the [place] one drink equals one 12 oz bottle of beer (355 mL), one 5 oz glass of wine (148 mL), or one 1 oz glass of hard liquor (44 mL). Lifestyle Brush your teeth every morning and night with fluoride toothpaste. Floss one time each day. Exercise for at least 30 minutes 5 or more days each  week. Do not use any products that contain nicotine or tobacco. These products include cigarettes, chewing tobacco, and vaping devices, such as e-cigarettes. If you need help quitting, ask your health care provider. Do not use drugs. If you are sexually active, practice safe sex. Use a condom or other form of protection to prevent STIs. Take aspirin only as told by your health  care provider. Make sure that you understand how much to take and what form to take. Work with your health care provider to find out whether it is safe and beneficial for you to take aspirin daily. Ask your health care provider if you need to take a cholesterol-lowering medicine (statin). Find healthy ways to manage stress, such as: Meditation, yoga, or listening to music. Journaling. Talking to a trusted person. Spending time with friends and family. Safety Always wear your seat belt while driving or riding in a vehicle. Do not drive: If you have been drinking alcohol. Do not ride with someone who has been drinking. When you are tired or distracted. While texting. If you have been using any mind-altering substances or drugs. Wear a helmet and other protective equipment during sports activities. If you have firearms in your house, make sure you follow all gun safety procedures. Minimize exposure to UV radiation to reduce your risk of skin cancer. What's next? Visit your health care provider once a year for an annual wellness visit. Ask your health care provider how often you should have your eyes and teeth checked. Stay up to date on all vaccines. This information is not intended to replace advice given to you by your health care provider. Make sure you discuss any questions you have with your health care provider. Document Revised: 09/22/2020 Document Reviewed: 09/22/2020 Elsevier Patient Education  2024 Elsevier Inc.     Signed,   Meredith Staggers, MD [facility], [facility] 02/22/23 5:38 PM

## 2023-02-22 NOTE — Telephone Encounter (Addendum)
Noted, history of polycythemia previously, with improvement on repeat testing.  He has been evaluated a few years ago by hematology with option of possible therapeutic phlebotomy.  He does report that 1 L was taken in the past and he did not tolerate that well. Other labs discussed as well, urine culture pending.  Borderline TSH, can recheck in 6 weeks.  (Please schedule that lab visit as well, TSH for hypothyroidism).  Overall electrolytes were stable.  Slight elevated cholesterol.  Will continue to watch diet at this time and recheck with next lab work.  Please schedule lab only visit tomorrow for repeat CBC to verify this result.  He would prefer to come in around 11 if possible.  Let me know if there are questions.

## 2023-02-22 NOTE — Telephone Encounter (Signed)
[place] lab calling in a critical lab result.  Caller name: Irven Shelling #: [phone]  Critical Result: Hemoglobin 18.7  Provider has been made aware via phone note and direct message (teams/verbal)

## 2023-02-22 NOTE — Addendum Note (Signed)
Addended by: Meredith Staggers R on: 02/22/2023 05:37 PM   Modules accepted: Orders

## 2023-02-22 NOTE — Patient Instructions (Signed)
I will check labs today including the prostate test but I have referred you back to urology given the history of prostate cancer and recent blood in the urine.  PSAs although normal may be trending up slightly, and can be discussed with urology as well.  Again I am checking that lab today.  If any other concerns on labs I will let you know.  No medication changes at this time.  Recheck in 6 months but let me know if there are any questions or concerns sooner.  Take care!  Preventive Care 52 Years and Older, Male Preventive care refers to lifestyle choices and visits with your health care provider that can promote health and wellness. Preventive care visits are also called wellness exams. What can I expect for my preventive care visit? Counseling During your preventive care visit, your health care provider may ask about your: Medical history, including: Past medical problems. Family medical history. History of falls. Current health, including: Emotional well-being. Home life and relationship well-being. Sexual activity. Memory and ability to understand (cognition). Lifestyle, including: Alcohol, nicotine or tobacco, and drug use. Access to firearms. Diet, exercise, and sleep habits. Work and work Astronomer. Sunscreen use. Safety issues such as seatbelt and bike helmet use. Physical exam Your health care provider will check your: Height and weight. These may be used to calculate your BMI (body mass index). BMI is a measurement that tells if you are at a healthy weight. Waist circumference. This measures the distance around your waistline. This measurement also tells if you are at a healthy weight and may help predict your risk of certain diseases, such as type 2 diabetes and high blood pressure. Heart rate and blood pressure. Body temperature. Skin for abnormal spots. What immunizations do I need?  Vaccines are usually given at various ages, according to a schedule. Your health care  provider will recommend vaccines for you based on your age, medical history, and lifestyle or other factors, such as travel or where you work. What tests do I need? Screening Your health care provider may recommend screening tests for certain conditions. This may include: Lipid and cholesterol levels. Diabetes screening. This is done by checking your blood sugar (glucose) after you have not eaten for a while (fasting). Hepatitis C test. Hepatitis B test. HIV (human immunodeficiency virus) test. STI (sexually transmitted infection) testing, if you are at risk. Lung cancer screening. Colorectal cancer screening. Prostate cancer screening. Abdominal aortic aneurysm (AAA) screening. You may need this if you are a current or former smoker. Talk with your health care provider about your test results, treatment options, and if necessary, the need for more tests. Follow these instructions at home: Eating and drinking  Eat a diet that includes fresh fruits and vegetables, whole grains, lean protein, and low-fat dairy products. Limit your intake of foods with high amounts of sugar, saturated fats, and salt. Take vitamin and mineral supplements as recommended by your health care provider. Do not drink alcohol if your health care provider tells you not to drink. If you drink alcohol: Limit how much you have to 0-2 drinks a day. Know how much alcohol is in your drink. In the [place] one drink equals one 12 oz bottle of beer (355 mL), one 5 oz glass of wine (148 mL), or one 1 oz glass of hard liquor (44 mL). Lifestyle Brush your teeth every morning and night with fluoride toothpaste. Floss one time each day. Exercise for at least 30 minutes 5 or more days  each week. Do not use any products that contain nicotine or tobacco. These products include cigarettes, chewing tobacco, and vaping devices, such as e-cigarettes. If you need help quitting, ask your health care provider. Do not use drugs. If you are  sexually active, practice safe sex. Use a condom or other form of protection to prevent STIs. Take aspirin only as told by your health care provider. Make sure that you understand how much to take and what form to take. Work with your health care provider to find out whether it is safe and beneficial for you to take aspirin daily. Ask your health care provider if you need to take a cholesterol-lowering medicine (statin). Find healthy ways to manage stress, such as: Meditation, yoga, or listening to music. Journaling. Talking to a trusted person. Spending time with friends and family. Safety Always wear your seat belt while driving or riding in a vehicle. Do not drive: If you have been drinking alcohol. Do not ride with someone who has been drinking. When you are tired or distracted. While texting. If you have been using any mind-altering substances or drugs. Wear a helmet and other protective equipment during sports activities. If you have firearms in your house, make sure you follow all gun safety procedures. Minimize exposure to UV radiation to reduce your risk of skin cancer. What's next? Visit your health care provider once a year for an annual wellness visit. Ask your health care provider how often you should have your eyes and teeth checked. Stay up to date on all vaccines. This information is not intended to replace advice given to you by your health care provider. Make sure you discuss any questions you have with your health care provider. Document Revised: 09/22/2020 Document Reviewed: 09/22/2020 Elsevier Patient Education  2024 [employer].

## 2023-02-23 ENCOUNTER — Other Ambulatory Visit: Payer: Self-pay | Admitting: Family Medicine

## 2023-02-23 ENCOUNTER — Other Ambulatory Visit: Payer: Self-pay

## 2023-02-23 ENCOUNTER — Other Ambulatory Visit (INDEPENDENT_AMBULATORY_CARE_PROVIDER_SITE_OTHER): Payer: Medicare HMO

## 2023-02-23 ENCOUNTER — Telehealth: Payer: Self-pay

## 2023-02-23 DIAGNOSIS — E039 Hypothyroidism, unspecified: Secondary | ICD-10-CM

## 2023-02-23 DIAGNOSIS — D751 Secondary polycythemia: Secondary | ICD-10-CM | POA: Diagnosis not present

## 2023-02-23 LAB — CBC
HCT: 55.2 % — ABNORMAL HIGH (ref 39.0–52.0)
Hemoglobin: 18.4 g/dL (ref 13.0–17.0)
MCHC: 33.3 g/dL (ref 30.0–36.0)
MCV: 98.1 fL (ref 78.0–100.0)
Platelets: 222 10*3/uL (ref 150.0–400.0)
RBC: 5.62 Mil/uL (ref 4.22–5.81)
RDW: 13.8 % (ref 11.5–15.5)
WBC: 5.8 10*3/uL (ref 4.0–10.5)

## 2023-02-23 LAB — TSH: TSH: 5.55 u[IU]/mL — ABNORMAL HIGH (ref 0.35–5.50)

## 2023-02-23 LAB — URINE CULTURE
MICRO NUMBER:: 15731573
Result:: NO GROWTH
SPECIMEN QUALITY:: ADEQUATE

## 2023-02-23 NOTE — Telephone Encounter (Signed)
See lab results.  Advise patient that I will refer him to hematology.  Let me know if there are questions.

## 2023-02-23 NOTE — Telephone Encounter (Signed)
Pt is aware.  

## 2023-02-23 NOTE — Progress Notes (Signed)
See labs, referral placed back to hematology to decide on therapeutic phlebotomy or further testing.  Previously treated by Dr. Candise Che in March 2021

## 2023-02-23 NOTE — Telephone Encounter (Signed)
6wk lab appt made for recheck TSH

## 2023-02-23 NOTE — Telephone Encounter (Signed)
[facility] calling in a critical lab result.  Caller name: Cindi Carbon #:[phone]  Critical Result: Hemoglobin 18.4   Provider has been made aware via phone note and direct message (teams/verbal)

## 2023-02-23 NOTE — Telephone Encounter (Signed)
Pt has been schedule for 11:00 today for lab. CBC has been ordered .

## 2023-03-19 ENCOUNTER — Telehealth: Payer: Self-pay | Admitting: Hematology

## 2023-03-26 ENCOUNTER — Other Ambulatory Visit: Payer: Medicare HMO

## 2023-03-26 ENCOUNTER — Encounter: Payer: Medicare HMO | Admitting: Hematology

## 2023-04-06 ENCOUNTER — Other Ambulatory Visit (INDEPENDENT_AMBULATORY_CARE_PROVIDER_SITE_OTHER): Payer: Medicare HMO

## 2023-04-06 DIAGNOSIS — E039 Hypothyroidism, unspecified: Secondary | ICD-10-CM

## 2023-04-06 LAB — TSH: TSH: 11.67 u[IU]/mL — ABNORMAL HIGH (ref 0.35–5.50)

## 2023-04-07 ENCOUNTER — Encounter: Payer: Self-pay | Admitting: Family Medicine

## 2023-04-08 ENCOUNTER — Other Ambulatory Visit: Payer: Self-pay | Admitting: Family Medicine

## 2023-04-08 ENCOUNTER — Encounter: Payer: Self-pay | Admitting: Family Medicine

## 2023-04-08 DIAGNOSIS — E039 Hypothyroidism, unspecified: Secondary | ICD-10-CM

## 2023-04-08 MED ORDER — LEVOTHYROXINE SODIUM 112 MCG PO TABS: 112.0000 ug | ORAL_TABLET | Freq: Every day | ORAL | 3 refills | Status: DC

## 2023-04-08 NOTE — Progress Notes (Unsigned)
See labs, new dose of Synthroid ordered.  Please schedule lab visit in 6 weeks for TSH, that has been ordered.

## 2023-04-09 NOTE — Progress Notes (Signed)
Pt has been scheduled 05/21/23

## 2023-04-20 ENCOUNTER — Encounter: Payer: Medicare HMO | Admitting: Hematology

## 2023-04-20 ENCOUNTER — Other Ambulatory Visit: Payer: Medicare HMO

## 2023-05-01 ENCOUNTER — Telehealth: Payer: Self-pay | Admitting: Family Medicine

## 2023-05-01 NOTE — Telephone Encounter (Signed)
Type of form received:[facility]   Additional comments: Prescriber Services- Levothyroxine Recall Info   Received ZO:XWRUEAV- Front Desk   Form should be Faxed/mailed to: N/A  Is patient requesting call for pickup:N/A  Form placed: [employer] charge sheet.  Provider will determine charge. N/A  Individual made aware of 3-5 business day turn around No?

## 2023-05-01 NOTE — Telephone Encounter (Signed)
Does not need a phone note, signature from provider not required

## 2023-05-03 ENCOUNTER — Telehealth: Payer: Self-pay

## 2023-05-03 NOTE — Telephone Encounter (Signed)
Received [facility] from [facility] requesting multiple patients have alternatives to their typical Levothyroxine tabs of varying strengths  Need to contact the pharmacy and find out if these have already been supplemented or if this change needs to be sent in by Dr Neva Seat as a new prescription

## 2023-05-04 NOTE — Telephone Encounter (Signed)
Patients TSH was abnormal in December he does have a repeat planned for 05/21/2023

## 2023-05-07 NOTE — Telephone Encounter (Signed)
Synthroid dosage changed late December, 6-week recheck TSH on that new dose.  Thanks

## 2023-05-07 NOTE — Telephone Encounter (Signed)
Should we have patient move up repeat testing?

## 2023-05-08 NOTE — Telephone Encounter (Signed)
Hold until results are back

## 2023-05-09 ENCOUNTER — Other Ambulatory Visit: Payer: Self-pay | Admitting: Family Medicine

## 2023-05-09 DIAGNOSIS — E039 Hypothyroidism, unspecified: Secondary | ICD-10-CM

## 2023-05-10 ENCOUNTER — Other Ambulatory Visit (HOSPITAL_COMMUNITY): Payer: Self-pay | Admitting: Urology

## 2023-05-10 DIAGNOSIS — C61 Malignant neoplasm of prostate: Secondary | ICD-10-CM

## 2023-05-10 DIAGNOSIS — R9721 Rising PSA following treatment for malignant neoplasm of prostate: Secondary | ICD-10-CM

## 2023-05-21 ENCOUNTER — Other Ambulatory Visit: Payer: Medicare HMO

## 2023-05-28 ENCOUNTER — Encounter (HOSPITAL_COMMUNITY)
Admission: RE | Admit: 2023-05-28 | Discharge: 2023-05-28 | Disposition: A | Discharge status: Home / Self Care | Payer: Medicare HMO | Source: Ambulatory Visit | Attending: Urology | Admitting: Urology

## 2023-05-28 DIAGNOSIS — C61 Malignant neoplasm of prostate: Secondary | ICD-10-CM | POA: Insufficient documentation

## 2023-05-28 DIAGNOSIS — R9721 Rising PSA following treatment for malignant neoplasm of prostate: Secondary | ICD-10-CM | POA: Diagnosis present

## 2023-05-28 MED ORDER — FLOTUFOLASTAT F 18 GALLIUM 296-5846 MBQ/ML IV SOLN
8.0000 | Freq: Once | INTRAVENOUS | Status: AC
  Administered 2023-05-28: 8.59 via INTRAVENOUS
  Filled 2023-05-28: qty 8

## 2023-05-30 ENCOUNTER — Other Ambulatory Visit: Payer: Medicare HMO

## 2023-06-04 ENCOUNTER — Other Ambulatory Visit (INDEPENDENT_AMBULATORY_CARE_PROVIDER_SITE_OTHER): Payer: Medicare HMO

## 2023-06-04 DIAGNOSIS — E039 Hypothyroidism, unspecified: Secondary | ICD-10-CM

## 2023-06-04 LAB — TSH: TSH: 13.32 u[IU]/mL — ABNORMAL HIGH (ref 0.35–5.50)

## 2023-06-05 ENCOUNTER — Encounter: Payer: Self-pay | Admitting: Family Medicine

## 2023-06-05 ENCOUNTER — Other Ambulatory Visit: Payer: Self-pay | Admitting: Family Medicine

## 2023-06-05 DIAGNOSIS — E039 Hypothyroidism, unspecified: Secondary | ICD-10-CM

## 2023-06-05 MED ORDER — LEVOTHYROXINE SODIUM 125 MCG PO TABS
125.0000 ug | ORAL_TABLET | Freq: Every day | ORAL | 2 refills | Status: DC
Start: 2023-06-05 — End: 2023-08-29

## 2023-06-05 NOTE — Progress Notes (Signed)
 See labs

## 2023-08-02 ENCOUNTER — Other Ambulatory Visit (INDEPENDENT_AMBULATORY_CARE_PROVIDER_SITE_OTHER)

## 2023-08-02 DIAGNOSIS — E039 Hypothyroidism, unspecified: Secondary | ICD-10-CM | POA: Diagnosis not present

## 2023-08-02 LAB — TSH: TSH: 3.78 u[IU]/mL (ref 0.35–5.50)

## 2023-08-04 ENCOUNTER — Other Ambulatory Visit: Payer: Self-pay | Admitting: Family Medicine

## 2023-08-04 DIAGNOSIS — E039 Hypothyroidism, unspecified: Secondary | ICD-10-CM

## 2023-08-07 ENCOUNTER — Encounter: Payer: Self-pay | Admitting: Family Medicine

## 2023-08-23 ENCOUNTER — Ambulatory Visit: Payer: Medicare HMO | Admitting: Family Medicine

## 2023-08-23 ENCOUNTER — Encounter: Payer: Self-pay | Admitting: Family Medicine

## 2023-08-23 VITALS — BP 122/64 | HR 82 | Temp 98.3°F | Resp 17 | Ht 73.0 in | Wt 138.2 lb

## 2023-08-23 DIAGNOSIS — E7800 Pure hypercholesterolemia, unspecified: Secondary | ICD-10-CM | POA: Diagnosis not present

## 2023-08-23 DIAGNOSIS — I10 Essential (primary) hypertension: Secondary | ICD-10-CM

## 2023-08-23 DIAGNOSIS — N529 Male erectile dysfunction, unspecified: Secondary | ICD-10-CM

## 2023-08-23 DIAGNOSIS — D751 Secondary polycythemia: Secondary | ICD-10-CM | POA: Diagnosis not present

## 2023-08-23 DIAGNOSIS — K625 Hemorrhage of anus and rectum: Secondary | ICD-10-CM

## 2023-08-23 DIAGNOSIS — E039 Hypothyroidism, unspecified: Secondary | ICD-10-CM

## 2023-08-23 LAB — LIPID PANEL
Cholesterol: 191 mg/dL (ref 0–200)
HDL: 91.3 mg/dL (ref >39.00)
LDL Cholesterol: 89 mg/dL (ref 0–99)
NonHDL: 99.57
Total CHOL/HDL Ratio: 2
Triglycerides: 55 mg/dL (ref 0.0–149.0)
VLDL: 11 mg/dL (ref 0.0–40.0)

## 2023-08-23 LAB — CBC WITH DIFFERENTIAL/PLATELET
Basophils Absolute: 0 10*3/uL (ref 0.0–0.1)
Basophils Relative: 0.5 % (ref 0.0–3.0)
Eosinophils Absolute: 0.1 10*3/uL (ref 0.0–0.7)
Eosinophils Relative: 1 % (ref 0.0–5.0)
HCT: 48.3 % (ref 39.0–52.0)
Hemoglobin: 16 g/dL (ref 13.0–17.0)
Lymphocytes Relative: 21.8 % (ref 12.0–46.0)
Lymphs Abs: 1.1 10*3/uL (ref 0.7–4.0)
MCHC: 33.2 g/dL (ref 30.0–36.0)
MCV: 98.4 fl (ref 78.0–100.0)
Monocytes Absolute: 0.6 10*3/uL (ref 0.1–1.0)
Monocytes Relative: 12.3 % — ABNORMAL HIGH (ref 3.0–12.0)
Neutro Abs: 3.3 10*3/uL (ref 1.4–7.7)
Neutrophils Relative %: 64.4 % (ref 43.0–77.0)
Platelets: 199 10*3/uL (ref 150.0–400.0)
RBC: 4.9 Mil/uL (ref 4.22–5.81)
RDW: 13.6 % (ref 11.5–15.5)
WBC: 5.1 10*3/uL (ref 4.0–10.5)

## 2023-08-23 LAB — COMPREHENSIVE METABOLIC PANEL WITH GFR
ALT: 12 U/L (ref 0–53)
AST: 21 U/L (ref 0–37)
Albumin: 4.4 g/dL (ref 3.5–5.2)
Alkaline Phosphatase: 65 U/L (ref 39–117)
BUN: 9 mg/dL (ref 6–23)
CO2: 30 meq/L (ref 19–32)
Calcium: 9.5 mg/dL (ref 8.4–10.5)
Chloride: 100 meq/L (ref 96–112)
Creatinine, Ser: 0.77 mg/dL (ref 0.40–1.50)
GFR: 87.55 mL/min (ref >60.00)
Glucose, Bld: 77 mg/dL (ref 70–99)
Potassium: 4.1 meq/L (ref 3.5–5.1)
Sodium: 140 meq/L (ref 135–145)
Total Bilirubin: 1.7 mg/dL — ABNORMAL HIGH (ref 0.2–1.2)
Total Protein: 7.2 g/dL (ref 6.0–8.3)

## 2023-08-23 NOTE — Progress Notes (Signed)
 Subjective:  Patient ID: Tyler Randall, male    DOB: September 10, 1945  Age: 78 y.o. MRN: [id number]  CC:  Chief Complaint  Patient presents with   Medical Management of Chronic Issues    Pt is well no concerns, fasting     HPI ABHIJOT STRAUGHTER presents for  Chronic condition follow-up.  Last physical in November 2024.  Hypercoagulable state with history of PE, DVT, polycythemia He is on Xarelto  indefinitely with history of above, prior evaluation by hematology.  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  Xarelto  20 mg daily.  No new bleeding or bruising.  Per episode of hematuria discussed at his last physical, under care of urology for prostate cancer treatment with seed implant. Hemoglobin level was elevated last visit.  Plan for repeat testing today. Minimal bleeding at anus after bowel movements. Intermittent since surgery in 2024. No straining or hard stools. Surgeon - Dr. Andy Bannister. No recent follow up with Dr. Andy Bannister. Has considered follow up to discuss.   No inhalers needed, no wheezing, dyspnea.  Quit smoking in 2017, 50 year hx with at least 1 ppd.  Yearly CT for follow up of aortic aneurysm. CT chest 12/2022: Unchanged linear perifissural opacity in the right lower lobe (series 4, image 146),possibly representing a site of rounded atelectasis. Unchanged 6 mm pleural nodule in the right lower lobe (series 4, image 74). Unchanged 3 mm pulmonary nodule in the left upper lobe (series 4, image 28). Lab Results  Component Value Date   WBC 5.8 02/23/2023   HGB 18.4 Repeated and verified X2. (HH) 02/23/2023   HCT 55.2 (H) 02/23/2023   MCV 98.1 02/23/2023   PLT 222.0 02/23/2023   Hypertension: Treated with amlodipine  5 mg daily, effective alcohol on health discussed previously, had cut back somewhat, 4-5 beers per day at his last visit with no plans on further decrease.  Declined need for assistance with decreasing alcohol.  Aware of potential health impacts. Home readings:  120/65 range.  BP  Readings from Last 3 Encounters:  08/23/23 122/64  02/22/23 132/76  07/13/22 122/68   Lab Results  Component Value Date   CREATININE 0.78 02/22/2023   Hypothyroidism: Lab Results  Component Value Date   TSH 3.78 08/02/2023  Synthroid  125 mcg daily, taking meds daily- level stable on higher dose recently.  Taking medication daily.  No new hot or cold intolerance. No new hair or skin changes, heart palpitations. No new weight changes. One daytime nap in afternoon past 6 months. No sleeping ok, no PND. No nighttime wakening or orthopnea.  Erectile dysfunction With history of prostate cancer.  Treated with Cialis , 10 mg.  Denies headache flushing chest pain or dyspnea with exertion, denies new hearing or vision changes and current dose has been effective.Urology note reviewed, most recently March 27.  He had been evaluated for gross hematuria with the workup pointing towards a source of bleeding from radiation changes to the prostate.  Treatment #1 of LI-EST on March 27 for erectile dysfunction, repeat treatment April 9. Plan to cut back on cialis  if possible - he has continued to use 10mg . Has been using otc device with soundwaves considering higher dosing - recommended he discuss options with urology given concern for higher dose need and use of other device.   HM: Covid booster discussed at pharmacy, last one in 12/2022.   Declines AWV. Recommended.   Hyperlipidemia: Elevated LDL of 119 in November, previously had been under 100. Lab Results  Component Value Date  CHOL 239 (H) 02/22/2023   HDL 106.40 02/22/2023   LDLCALC 119 (H) 02/22/2023   TRIG 69.0 02/22/2023   CHOLHDL 2 02/22/2023   Lab Results  Component Value Date   ALT 18 02/22/2023   AST 27 02/22/2023   ALKPHOS 65 02/22/2023   BILITOT 2.0 (H) 02/22/2023      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   Deep vein thrombosis (DVT) of proximal vein of left lower  extremity (HCC) 03/02/2019   Pulmonary embolus (HCC) 03/02/2019   Prostate cancer (HCC) 03/27/2014   Hypothyroidism 10/06/2013   Vitamin D  deficiency 10/06/2013   Thyroid  condition 11/28/2011   Past Medical History:  Diagnosis Date   AAA (abdominal aortic aneurysm) (HCC)    followed by dr Sherene Dilling--  per last CT epic 4.6cm   Anticoagulant long-term use    xarelto --- managed by pcp   BPH associated with nocturia    COPD (chronic obstructive pulmonary disease) (HCC)    Depression    History of adenomatous polyp of colon    History of DVT of lower extremity 11/2018   LLE   History of low-risk melanoma 2003   s/p  WLE back superficial   History of nonmelanoma skin cancer    left arm   History of prostate cancer 2003   s/p  radioactive seed implants   History of pulmonary embolism 11/2018   previously followed by dr Salomon Cree (hematology)  per pt negative work-up   Hypertension    Hypothyroidism    followed by pcp   Lesion of lip    04-25-2022  per pt is scheduled for moh's surgery in feb.,  stated not melanoma   Polycythemia    secondary ;  followed by pcp   Rectal polyp    Wears glasses    Past Surgical History:  Procedure Laterality Date   COLONOSCOPY WITH PROPOFOL   01/24/2022   dr Dominic Friendly   MELANOMA EXCISION  2003   WLE back   RADIOACTIVE SEED IMPLANT  2003   prostate   RETINAL TEAR REPAIR CRYOTHERAPY Right 04/2022   TONSILLECTOMY     child   TRANSANAL RECTAL RESECTION N/A 04/27/2022   Procedure: TRANSANAL RESECTION rectal polyp;  Surgeon: Joyce Nixon, MD;  Location: [facility];  Service: General;  Laterality: N/A;   Allergies  Allergen Reactions   Microplegia Msa-Msg [Cardioplegia Del Nido Formula] Hives and Swelling    M.S.G. in food.   Selenium Gluconate [Selenium] Anaphylaxis and Hives   Latex Other (See Comments)    With prolonged exposure   Sulfa Antibiotics Hives    Not sure reaction, started in infancy   Prior to Admission medications    Medication Sig Start Date End Date Taking? Authorizing Provider  amLODipine  (NORVASC ) 5 MG tablet Take 1 tablet (5 mg total) by mouth daily. 02/22/23  Yes Benjiman Bras, MD  levothyroxine  (SYNTHROID ) 125 MCG tablet Take 1 tablet (125 mcg total) by mouth daily. 06/05/23  Yes Benjiman Bras, MD  Multiple Vitamins-Minerals (CENTRUM SILVER PO) Take 1 tablet by mouth daily after lunch.   Yes [provider]  rivaroxaban  (XARELTO ) 20 MG TABS tablet TAKE 1 TABLET (20 MG TOTAL) BY MOUTH DAILY WITH SUPPER. 02/22/23  Yes Benjiman Bras, MD  tadalafil  (CIALIS ) 10 MG tablet Take 1 tablet (10 mg total) by mouth every other day as needed for erectile dysfunction. Patient taking differently: Take 10 mg by mouth every other  day as needed for erectile dysfunction. 03/08/22  Yes Benjiman Bras, MD   Social History   Socioeconomic History   Marital status: Divorced    Spouse name: Not on file   Number of children: 0   Years of education: Not on file   Highest education level: Not on file  Occupational History   Occupation: retired  Tobacco Use   Smoking status: Former    Current packs/day: 0.00    Average packs/day: 3.5 packs/day for 50.8 years (177.9 ttl pk-yrs)    Types: Cigarettes    Start date: 3    Quit date: 02/03/2016    Years since quitting: 7.5   Smokeless tobacco: Never   Tobacco comments:    cut down to 1 pack when quit in 2017  Vaping Use   Vaping status: Never Used  Substance and Sexual Activity   Alcohol use: Yes    Alcohol/week: 20.0 standard drinks of alcohol    Types: 20 Cans of beer per week   Drug use: Never   Sexual activity: Yes  Other Topics Concern   Not on file  Social History Narrative   Not on file   Social Drivers of Health   Financial Resource Strain: Not on file  Food Insecurity: Not on file  Transportation Needs: Not on file  Physical Activity: Not on file  Stress: Not on file  Social Connections: Not on file  Intimate Partner  Violence: Not on file    Review of Systems Per HPI  Objective:   Vitals:   08/23/23 1052  BP: 122/64  Pulse: 82  Resp: 17  Temp: 98.3 F (36.8 C)  TempSrc: Temporal  SpO2: 98%  Weight: 138 lb 3.2 oz (62.7 kg)  Height: 6\' 1"  (1.854 m)     Physical Exam Vitals reviewed.  Constitutional:      Appearance: He is well-developed.  HENT:     Head: Normocephalic and atraumatic.  Neck:     Vascular: No carotid bruit or JVD.  Cardiovascular:     Rate and Rhythm: Normal rate and regular rhythm.     Heart sounds: Normal heart sounds. No murmur heard. Pulmonary:     Effort: Pulmonary effort is normal.     Breath sounds: Normal breath sounds. No rales.  Musculoskeletal:     Right lower leg: No edema.     Left lower leg: No edema.  Skin:    General: Skin is warm and dry.  Neurological:     Mental Status: He is alert and oriented to person, place, and time.  Psychiatric:        Mood and Affect: Mood normal.        Assessment & Plan:  KARTEL WOLBERT is a 78 y.o. male . Hypothyroidism, unspecified type  - Stable on most recent testing, continue same regimen.  Polycythemia - Plan: CBC with Differential/Platelet  - Check labs and adjust meds accordingly, known history of polycythemia, monitor for stability.  He is on anticoagulation as above.  Essential hypertension - Plan: CBC with Differential/Platelet, Comprehensive metabolic panel with GFR Overall stable, no med changes at this time, check labs as above and adjust plan accordingly.  Erectile dysfunction, unspecified erectile dysfunction type Under the care of urology, recommend he discuss the over-the-counter device and any recommended changes in meds with urology as that has been discussed as well as various treatments through urology.  BRBPR (bright red blood per rectum) Known history of same and has been followed by  colorectal surgeon.  Given some intermittent recurrent symptoms recommended he contact his surgeon  for appointment to discuss further.  RTC/ER precautions if acute worsening.  Elevated LDL cholesterol level - Plan: Comprehensive metabolic panel with GFR, Lipid panel Check labs, ASCVD scoring and then can decide if statin need or recommendation.  No orders of the defined types were placed in this encounter.  Patient Instructions  Thanks for coming in today.  No medication changes at this time.  Most recent thyroid  test is controlled, continue same dose of meds for now and we can recheck your levels in about 6 months.  I will recheck the blood counts today.  Keep follow-up with specialist as planned.  I do recommend calling your surgeon to follow-up on the intermittent rectal bleeding.  I would also recommend discussing the over-the-counter device as well as question regarding your Cialis  with urology since they are treating erectile dysfunction.  Please let me know if I can help further and take care!       Signed,   Caro Christmas, MD [facility], [facility] 08/23/23 10:59 AM

## 2023-08-23 NOTE — Patient Instructions (Addendum)
 Thanks for coming in today.  No medication changes at this time.  Most recent thyroid  test is controlled, continue same dose of meds for now and we can recheck your levels in about 6 months.  I will recheck the blood counts today.  Keep follow-up with specialist as planned.  I do recommend calling your surgeon to follow-up on the intermittent rectal bleeding.  I would also recommend discussing the over-the-counter device as well as question regarding your Cialis  with urology since they are treating erectile dysfunction.  Please let me know if I can help further and take care!

## 2023-08-25 ENCOUNTER — Encounter: Payer: Self-pay | Admitting: Family Medicine

## 2023-08-28 ENCOUNTER — Ambulatory Visit: Payer: Self-pay | Admitting: Family Medicine

## 2023-08-29 ENCOUNTER — Other Ambulatory Visit: Payer: Self-pay | Admitting: Family Medicine

## 2023-08-29 DIAGNOSIS — E039 Hypothyroidism, unspecified: Secondary | ICD-10-CM

## 2023-09-24 ENCOUNTER — Ambulatory Visit: Payer: Self-pay | Admitting: General Surgery

## 2023-09-24 NOTE — H&P (Signed)
 PROVIDER:  Denese Finn, MD  MRN: [id number] DOB: [birth year] DATE OF ENCOUNTER: 09/24/2023 Interval History:    78 y.o.  male with a history of rectal bleeding and rectal polyp.  This was noted in 2019 and removed piecemeal during colonoscopy.  He underwent a sigmoidoscopy in April 2021 and this was once again removed.  He continued to have episodes of blood on the toilet paper and underwent a colonoscopy in October which showed polypoid tissue adjacent to a polypectomy scar.  Biopsies were taken.  This showed a tubular adenoma negative for high-grade dysplasia or carcinoma.  He underwent a TAE on 1/18.  Final path showed no residual abnormal tissue.    He know presents today with about 1 year of persistent rectal bleeding after BM's as well as itching.  Past Medical History:  Diagnosis Date   AAA (abdominal aortic aneurysm) (HCC)    followed by dr Sherene Dilling--  per last CT epic 4.6cm   Anticoagulant long-term use    xarelto --- managed by pcp   BPH associated with nocturia    COPD (chronic obstructive pulmonary disease) (HCC)    Depression    History of adenomatous polyp of colon    History of DVT of lower extremity 11/2018   LLE   History of low-risk melanoma 2003   s/p  WLE back superficial   History of nonmelanoma skin cancer    left arm   History of prostate cancer 2003   s/p  radioactive seed implants   History of pulmonary embolism 11/2018   previously followed by dr Salomon Cree (hematology)  per pt negative work-up   Hypertension    Hypothyroidism    followed by pcp   Lesion of lip    04-25-2022  per pt is scheduled for moh's surgery in feb.,  stated not melanoma   Polycythemia    secondary ;  followed by pcp   Rectal polyp    Wears glasses    Past Surgical History:  Procedure Laterality Date   COLONOSCOPY WITH PROPOFOL   01/24/2022   dr Dominic Friendly   MELANOMA EXCISION  2003   WLE back   RADIOACTIVE SEED IMPLANT  2003   prostate   RETINAL TEAR REPAIR CRYOTHERAPY  Right 04/2022   TONSILLECTOMY     child   TRANSANAL RECTAL RESECTION N/A 04/27/2022   Procedure: TRANSANAL RESECTION rectal polyp;  Surgeon: Joyce Nixon, MD;  Location: [facility];  Service: General;  Laterality: N/A;   Family History  Problem Relation Age of Onset   Pneumonia Mother    Liver cancer Father    Thyroid  disease Sister    Thyroid  disease Sister    Prostate cancer Brother    Thyroid  disease Brother    Mental retardation Brother    Lung cancer Brother    Bone cancer Brother    Liver cancer Brother    Colon polyps Neg Hx    Colon cancer Neg Hx    Esophageal cancer Neg Hx    Rectal cancer Neg Hx    Stomach cancer Neg Hx    Social History   Socioeconomic History   Marital status: Divorced    Spouse name: Not on file   Number of children: 0   Years of education: Not on file   Highest education level: Not on file  Occupational History   Occupation: retired  Tobacco Use   Smoking status: Former    Current packs/day: 0.00    Average packs/day: 3.5 packs/day for  50.8 years (177.9 ttl pk-yrs)    Types: Cigarettes    Start date: 43    Quit date: 02/03/2016    Years since quitting: 7.6   Smokeless tobacco: Never   Tobacco comments:    cut down to 1 pack when quit in 2017  Vaping Use   Vaping status: Never Used  Substance and Sexual Activity   Alcohol use: Yes    Alcohol/week: 20.0 standard drinks of alcohol    Types: 20 Cans of beer per week   Drug use: Never   Sexual activity: Yes  Other Topics Concern   Not on file  Social History Narrative   Not on file   Social Drivers of Health   Financial Resource Strain: Not on file  Food Insecurity: Not on file  Transportation Needs: Not on file  Physical Activity: Not on file  Stress: Not on file  Social Connections: Not on file  Intimate Partner Violence: Not on file    Current Outpatient Medications:    amLODipine  (NORVASC ) 5 MG tablet, Take 1 tablet (5 mg total) by mouth daily.,  Disp: 90 tablet, Rfl: 2   levothyroxine  (SYNTHROID ) 125 MCG tablet, TAKE 1 TABLET BY MOUTH EVERY DAY, Disp: 90 tablet, Rfl: 1   Multiple Vitamins-Minerals (CENTRUM SILVER PO), Take 1 tablet by mouth daily after lunch., Disp: , Rfl:    rivaroxaban  (XARELTO ) 20 MG TABS tablet, TAKE 1 TABLET (20 MG TOTAL) BY MOUTH DAILY WITH SUPPER., Disp: 90 tablet, Rfl: 2   tadalafil  (CIALIS ) 10 MG tablet, Take 1 tablet (10 mg total) by mouth every other day as needed for erectile dysfunction. (Patient taking differently: Take 10 mg by mouth every other day as needed for erectile dysfunction.), Disp: 10 tablet, Rfl: 11 Allergies  Allergen Reactions   Microplegia Msa-Msg [Cardioplegia Del Nido Formula] Hives and Swelling    M.S.G. in food.   Selenium Gluconate [Selenium] Anaphylaxis and Hives   Latex Other (See Comments)    With prolonged exposure   Sulfa Antibiotics Hives    Not sure reaction, started in infancy   Review of Systems - Negative except as stated above    Physical Examination:   Gen: NAD  Rectal: palpable polyp in RA distal rectum.    Assessment and Plan:   Tyler Randall is a 78 y.o. male who underwent TAE of polypectomy scar in 2021.  He now appears to have a small recurrence despite no adenomatous tissue being seen within the previous polypectomy scar.  I have recommended re-excision.  Risks include bleeding, pain and recurrence.  All questions were answered.  Pt would like to proceed.     Diagnoses and all orders for this visit:  Rectal polyp    The plan was discussed in detail with the patient today, who expressed understanding.  The patient has my contact information, and understands to call me with any additional questions or concerns in the interval.  I would be happy to see the patient back sooner if the need arises.   Fernande Howells, MD Colon and Rectal Surgery [facility]

## 2023-11-01 ENCOUNTER — Encounter (HOSPITAL_BASED_OUTPATIENT_CLINIC_OR_DEPARTMENT_OTHER): Payer: Self-pay | Admitting: General Surgery

## 2023-11-01 ENCOUNTER — Other Ambulatory Visit: Payer: Self-pay

## 2023-11-01 NOTE — Progress Notes (Signed)
   11/01/23 1108  PAT Phone Screen  Is the patient taking a GLP-1 receptor agonist? No  Do You Have Diabetes? No  Do You Have Hypertension? (S)  Yes  Have You Ever Been to the ER for Asthma? No  Have You Taken Oral Steroids in the Past 3 Months? No  Do you Take Phenteramine or any Other Diet Drugs? No  Recent  Lab Work, EKG, CXR? No  Do you have a history of heart problems? (S)  Yes (hx of AAA, regularly follows up with cards and annual CTA)  Cardiologist Name [facility]  Have you ever had tests on your heart? (S)  Yes  What cardiac tests were performed? (S)  Echo;Other (comment)  What date/year were cardiac tests completed? (S)  CTA 2024, ECHO 2020 EF 50-55%  Results viewable: [facility] [employer] Tab  Any Recent Hospitalizations? No  Height 6' 1 (1.854 m)  Weight 62.7 kg  Pat Appointment Scheduled (S)  Yes (CMP, EKG)   Reviewed chart with Dr. Paul, ok for day surgery. Pt instructed to continue Xarelto  per cards.

## 2023-11-01 NOTE — Progress Notes (Signed)
 Reviewed chart with Dr. Paul, ok for [facility]

## 2023-11-02 ENCOUNTER — Encounter (HOSPITAL_BASED_OUTPATIENT_CLINIC_OR_DEPARTMENT_OTHER)
Admission: RE | Admit: 2023-11-02 | Discharge: 2023-11-02 | Disposition: A | Discharge status: Home / Self Care | Source: Ambulatory Visit | Attending: General Surgery | Admitting: General Surgery

## 2023-11-02 DIAGNOSIS — Z01818 Encounter for other preprocedural examination: Secondary | ICD-10-CM | POA: Insufficient documentation

## 2023-11-02 DIAGNOSIS — I10 Essential (primary) hypertension: Secondary | ICD-10-CM | POA: Insufficient documentation

## 2023-11-02 LAB — COMPREHENSIVE METABOLIC PANEL WITH GFR
ALT: 12 U/L (ref 0–44)
AST: 22 U/L (ref 15–41)
Albumin: 3.7 g/dL (ref 3.5–5.0)
Alkaline Phosphatase: 56 U/L (ref 38–126)
Anion gap: 8 (ref 5–15)
BUN: 10 mg/dL (ref 8–23)
CO2: 28 mmol/L (ref 22–32)
Calcium: 9.1 mg/dL (ref 8.9–10.3)
Chloride: 103 mmol/L (ref 98–111)
Creatinine, Ser: 0.93 mg/dL (ref 0.61–1.24)
GFR, Estimated: >60 mL/min (ref >60)
Glucose, Bld: 161 mg/dL — ABNORMAL HIGH (ref 70–99)
Potassium: 4.3 mmol/L (ref 3.5–5.1)
Sodium: 139 mmol/L (ref 135–145)
Total Bilirubin: 1.5 mg/dL — ABNORMAL HIGH (ref 0.0–1.2)
Total Protein: 6.5 g/dL (ref 6.5–8.1)

## 2023-11-08 ENCOUNTER — Other Ambulatory Visit: Payer: Self-pay

## 2023-11-08 ENCOUNTER — Ambulatory Visit (HOSPITAL_BASED_OUTPATIENT_CLINIC_OR_DEPARTMENT_OTHER): Payer: Self-pay | Admitting: Anesthesiology

## 2023-11-08 ENCOUNTER — Encounter (HOSPITAL_BASED_OUTPATIENT_CLINIC_OR_DEPARTMENT_OTHER): Payer: Self-pay | Admitting: General Surgery

## 2023-11-08 ENCOUNTER — Ambulatory Visit (HOSPITAL_BASED_OUTPATIENT_CLINIC_OR_DEPARTMENT_OTHER)
Admission: RE | Admit: 2023-11-08 | Discharge: 2023-11-08 | Disposition: A | Discharge status: Home / Self Care | Attending: General Surgery | Admitting: General Surgery

## 2023-11-08 ENCOUNTER — Encounter (HOSPITAL_BASED_OUTPATIENT_CLINIC_OR_DEPARTMENT_OTHER)
Admission: RE | Disposition: A | Discharge status: Home / Self Care | Payer: Self-pay | Source: Home / Self Care | Attending: General Surgery

## 2023-11-08 DIAGNOSIS — Z87891 Personal history of nicotine dependence: Secondary | ICD-10-CM | POA: Diagnosis not present

## 2023-11-08 DIAGNOSIS — Z79899 Other long term (current) drug therapy: Secondary | ICD-10-CM | POA: Insufficient documentation

## 2023-11-08 DIAGNOSIS — K621 Rectal polyp: Secondary | ICD-10-CM | POA: Insufficient documentation

## 2023-11-08 DIAGNOSIS — I10 Essential (primary) hypertension: Secondary | ICD-10-CM

## 2023-11-08 DIAGNOSIS — E039 Hypothyroidism, unspecified: Secondary | ICD-10-CM | POA: Insufficient documentation

## 2023-11-08 DIAGNOSIS — J449 Chronic obstructive pulmonary disease, unspecified: Secondary | ICD-10-CM

## 2023-11-08 DIAGNOSIS — Z79890 Hormone replacement therapy (postmenopausal): Secondary | ICD-10-CM | POA: Diagnosis not present

## 2023-11-08 SURGERY — TRANSRECTAL TUMOR EXCISION
Anesthesia: Monitor Anesthesia Care

## 2023-11-08 MED ORDER — SODIUM CHLORIDE 0.9 % IV SOLN: INTRAVENOUS | Status: DC

## 2023-11-08 MED ORDER — FENTANYL CITRATE (PF) 100 MCG/2ML IJ SOLN
INTRAMUSCULAR | Status: AC
  Filled 2023-11-08: qty 2

## 2023-11-08 MED ORDER — DROPERIDOL 2.5 MG/ML IJ SOLN: 0.6250 mg | Freq: Once | INTRAMUSCULAR | Status: DC | PRN

## 2023-11-08 MED ORDER — FENTANYL CITRATE (PF) 100 MCG/2ML IJ SOLN
INTRAMUSCULAR | Status: DC | PRN
  Administered 2023-11-08 (×2): 50 ug via INTRAVENOUS

## 2023-11-08 MED ORDER — BUPIVACAINE-EPINEPHRINE 0.5% -1:200000 IJ SOLN
INTRAMUSCULAR | Status: DC | PRN
  Administered 2023-11-08: 27 mL

## 2023-11-08 MED ORDER — ACETAMINOPHEN 10 MG/ML IV SOLN: 1000.0000 mg | Freq: Once | INTRAVENOUS | Status: DC | PRN

## 2023-11-08 MED ORDER — SODIUM CHLORIDE 0.9% FLUSH: 3.0000 mL | Freq: Two times a day (BID) | INTRAVENOUS | Status: DC

## 2023-11-08 MED ORDER — OXYCODONE HCL 5 MG PO TABS: 5.0000 mg | ORAL_TABLET | Freq: Once | ORAL | Status: DC | PRN

## 2023-11-08 MED ORDER — PROPOFOL 10 MG/ML IV BOLUS
INTRAVENOUS | Status: DC | PRN
Start: 2023-11-08 — End: 2023-11-08
  Administered 2023-11-08: 50 mg via INTRAVENOUS

## 2023-11-08 MED ORDER — MIDAZOLAM HCL 2 MG/2ML IJ SOLN
INTRAMUSCULAR | Status: DC | PRN
  Administered 2023-11-08 (×2): 1 mg via INTRAVENOUS

## 2023-11-08 MED ORDER — PROPOFOL 500 MG/50ML IV EMUL
INTRAVENOUS | Status: DC | PRN
  Administered 2023-11-08: 200 ug/kg/min via INTRAVENOUS

## 2023-11-08 MED ORDER — FENTANYL CITRATE (PF) 100 MCG/2ML IJ SOLN: 25.0000 ug | INTRAMUSCULAR | Status: DC | PRN

## 2023-11-08 MED ORDER — OXYCODONE HCL 5 MG/5ML PO SOLN: 5.0000 mg | Freq: Once | ORAL | Status: DC | PRN

## 2023-11-08 MED ORDER — KETAMINE HCL 50 MG/5ML IJ SOSY
PREFILLED_SYRINGE | INTRAMUSCULAR | Status: AC
Start: 2023-11-08 — End: 2023-11-08
  Filled 2023-11-08: qty 5

## 2023-11-08 MED ORDER — ACETIC ACID 5 % SOLN
Status: AC
  Filled 2023-11-08: qty 24

## 2023-11-08 MED ORDER — TRAMADOL HCL 50 MG PO TABS: 50.0000 mg | ORAL_TABLET | Freq: Four times a day (QID) | ORAL | 0 refills | Status: DC | PRN

## 2023-11-08 MED ORDER — MIDAZOLAM HCL 2 MG/2ML IJ SOLN
INTRAMUSCULAR | Status: AC
  Filled 2023-11-08: qty 2

## 2023-11-08 SURGICAL SUPPLY — 39 items
BENZOIN TINCTURE PRP APPL 2/3 (GAUZE/BANDAGES/DRESSINGS) ×2 IMPLANT
BLADE EXTENDED COATED 6.5IN (ELECTRODE) IMPLANT
BLADE SURG 10 STRL SS (BLADE) ×2 IMPLANT
BRIEF MESH DISP 2XL (UNDERPADS AND DIAPERS) ×2 IMPLANT
COVER BACK TABLE 60X90IN (DRAPES) ×2 IMPLANT
COVER MAYO STAND STRL (DRAPES) ×2 IMPLANT
DRAPE HYSTEROSCOPY (MISCELLANEOUS) IMPLANT
DRAPE LAPAROTOMY 100X72 PEDS (DRAPES) ×2 IMPLANT
DRAPE UTILITY XL STRL (DRAPES) ×2 IMPLANT
ELECTRODE REM PT RTRN 9FT ADLT (ELECTROSURGICAL) ×2 IMPLANT
GAUZE 4X4 16PLY ~~LOC~~+RFID DBL (SPONGE) ×2 IMPLANT
GAUZE PAD ABD 8X10 STRL (GAUZE/BANDAGES/DRESSINGS) ×2 IMPLANT
GAUZE SPONGE 4X4 12PLY STRL (GAUZE/BANDAGES/DRESSINGS) IMPLANT
GLOVE BIO SURGEON STRL SZ 6.5 (GLOVE) ×2 IMPLANT
GLOVE INDICATOR 6.5 STRL GRN (GLOVE) ×2 IMPLANT
GOWN STRL REUS W/TWL XL LVL3 (GOWN DISPOSABLE) ×2 IMPLANT
KIT SIGMOIDOSCOPE (SET/KITS/TRAYS/PACK) IMPLANT
LEGGING LITHOTOMY PAIR STRL (DRAPES) IMPLANT
NDL HYPO 22X1.5 SAFETY MO (MISCELLANEOUS) ×2 IMPLANT
NDL SAFETY ECLIPSE 18X1.5 (NEEDLE) IMPLANT
NEEDLE HYPO 22X1.5 SAFETY MO (MISCELLANEOUS) ×1 IMPLANT
NS IRRIG 1000ML POUR BTL (IV SOLUTION) ×2 IMPLANT
PACK BASIN DAY SURGERY FS (CUSTOM PROCEDURE TRAY) ×2 IMPLANT
PAD ARMBOARD POSITIONER FOAM (MISCELLANEOUS) IMPLANT
PENCIL SMOKE EVACUATOR (MISCELLANEOUS) ×2 IMPLANT
SHEET MEDIUM DRAPE 40X70 STRL (DRAPES) IMPLANT
SLEEVE SCD COMPRESS KNEE MED (STOCKING) ×2 IMPLANT
SPIKE FLUID TRANSFER (MISCELLANEOUS) ×2 IMPLANT
SPONGE SURGIFOAM ABS GEL 12-7 (HEMOSTASIS) IMPLANT
SUT CHROMIC 2 0 SH (SUTURE) IMPLANT
SUT CHROMIC 3 0 SH 27 (SUTURE) IMPLANT
SUT VIC AB 2-0 SH 18 (SUTURE) IMPLANT
SUT VIC AB 2-0 SH 27XBRD (SUTURE) IMPLANT
SUT VIC AB 3-0 SH 27X BRD (SUTURE) IMPLANT
SYR CONTROL 10ML LL (SYRINGE) ×2 IMPLANT
TOWEL GREEN STERILE FF (TOWEL DISPOSABLE) ×2 IMPLANT
TRAY DSU PREP LF (CUSTOM PROCEDURE TRAY) ×2 IMPLANT
TUBE CONNECTING 20X1/4 (TUBING) ×2 IMPLANT
YANKAUER SUCT BULB TIP NO VENT (SUCTIONS) ×2 IMPLANT

## 2023-11-08 NOTE — Op Note (Signed)
 11/08/2023  11:30 AM  PATIENT:  Tyler Randall  78 y.o. male  Patient Care Team: Levora Reyes SAUNDERS, MD as PCP - General (Family Medicine) Legrand, Victory LITTIE MOULD, MD as Consulting Physician (Gastroenterology) Tyler Hila, MD as Consulting Physician (General Surgery) Joshua Sieving, MD as Consulting Physician (Dermatology) Lucas Dorise POUR, MD as Consulting Physician (Cardiothoracic Surgery)  PRE-OPERATIVE DIAGNOSIS:  RECURRENT DISTAL RECTAL POLYP  POST-OPERATIVE DIAGNOSIS:  RECURRENT DISTAL RECTAL POLYP  PROCEDURE:  FULL THICKNESS PARTIAL PROCTECTOMY, TRANSANAL     Surgeon(s): Tyler Hila, MD  ASSISTANT: none   ANESTHESIA:   local and MAC  SPECIMEN:  Source of Specimen:  anterior distal rectal polyp, additional distal margin  DISPOSITION OF SPECIMEN:  PATHOLOGY  COUNTS:  YES  PLAN OF CARE: Discharge to home after PACU  PATIENT DISPOSITION:  PACU - hemodynamically stable.  INDICATION: 78 y.o. M with recurrent adenoma at previous resection margin in the anterior distal rectum   OR FINDINGS: ~1cm anterior distal rectal polyp  DESCRIPTION: the patient was identified in the preoperative holding area and taken to the OR where they were laid on the operating room table.  MAC anesthesia was induced without difficulty. The patient was then positioned in prone jackknife position with buttocks gently taped apart.  The patient was then prepped and draped in usual sterile fashion.  SCDs were noted to be in place prior to the initiation of anesthesia. A surgical timeout was performed indicating the correct patient, procedure, positioning and need for preoperative antibiotics.  A rectal block was performed using Marcaine  with epinephrine .    I began with a digital rectal exam.  The mass was noted at ~6cm in the right anterior distal rectum.  I then placed a Hill-Ferguson anoscope into the anal canal and evaluated this completely.  The edges around the tumor were marked with cautery with ~45mm  margins.  I then began distally and divided through the entire mucosa down to the subcutaneous fat.  I then continued around both edges and then divided the proximal margin.  Hemostasis was obtained with cautery as we went.  The specimen was then marked with a stitch on the proximal margin.  Additional distal margin and medial margin was taken using cautery to the level of the sphincter complex distally.  I then closed the excision site using 2, running 2-0 Vicryl sutures.  Additional interrupted 2-0 Vicryl sutures were used to secure the rectal mucosa to the dentate line.  Hemostasis was good.  The retractor was removed and additional Marcaine  was placed at the distal suture line for post op pain control.  A dressing was applied and the patient was awakened from anesthesia and sent to the PACU in stable condition.  All counts were correct per OR staff.  Hila JAYSON Debby, MD  Colorectal and General Surgery [facility]

## 2023-11-08 NOTE — Anesthesia Preprocedure Evaluation (Addendum)
 Anesthesia Evaluation  Patient identified by MRN, date of birth, ID band Patient awake    Reviewed: Allergy & Precautions, NPO status , Patient's Chart, lab work & pertinent test results  Airway Mallampati: I  TM Distance: >3 FB Neck ROM: Full    Dental  (+) Teeth Intact, Dental Advisory Given   Pulmonary COPD, former smoker    + decreased breath sounds      Cardiovascular hypertension, Pt. on medications  Rhythm:Regular Rate:Normal     Neuro/Psych  PSYCHIATRIC DISORDERS  Depression       GI/Hepatic negative GI ROS, Neg liver ROS,,,  Endo/Other  Hypothyroidism    Renal/GU negative Renal ROS     Musculoskeletal negative musculoskeletal ROS (+)    Abdominal   Peds  Hematology   Anesthesia Other Findings   Reproductive/Obstetrics                              Anesthesia Physical Anesthesia Plan  ASA: 3  Anesthesia Plan: MAC   Post-op Pain Management: Tylenol  PO (pre-op)*   Induction: Intravenous  PONV Risk Score and Plan: 2 and Ondansetron , Midazolam  and Propofol  infusion  Airway Management Planned: Natural Airway and Nasal Cannula  Additional Equipment: None  Intra-op Plan:   Post-operative Plan:   Informed Consent: I have reviewed the patients History and Physical, chart, labs and discussed the procedure including the risks, benefits and alternatives for the proposed anesthesia with the patient or authorized representative who has indicated his/her understanding and acceptance.     Dental advisory given  Plan Discussed with: CRNA  Anesthesia Plan Comments:         Anesthesia Quick Evaluation

## 2023-11-08 NOTE — H&P (Signed)
 Expand All Collapse All     PROVIDER:  BERNARDA WANDA NED, MD   MRN: [id number] DOB: [birth year] Interval History:     78 y.o.  male with a history of rectal bleeding and rectal polyp.  This was noted in 2019 and removed piecemeal during colonoscopy.  He underwent a sigmoidoscopy in April 2021 and this was once again removed.  He continued to have episodes of blood on the toilet paper and underwent a colonoscopy in October which showed polypoid tissue adjacent to a polypectomy scar.  Biopsies were taken.  This showed a tubular adenoma negative for high-grade dysplasia or carcinoma.  He underwent a TAE on 1/18.  Final path showed no residual abnormal tissue.    He know presents today with about 1 year of persistent rectal bleeding after BM's as well as itching.       Past Medical History:  Diagnosis Date   AAA (abdominal aortic aneurysm) (HCC)      followed by dr lucas--  per last CT epic 4.6cm   Anticoagulant long-term use      xarelto --- managed by pcp   BPH associated with nocturia     COPD (chronic obstructive pulmonary disease) (HCC)     Depression     History of adenomatous polyp of colon     History of DVT of lower extremity 11/2018    LLE   History of low-risk melanoma 2003    s/p  WLE back superficial   History of nonmelanoma skin cancer      left arm   History of prostate cancer 2003    s/p  radioactive seed implants   History of pulmonary embolism 11/2018    previously followed by dr onesimo (hematology)  per pt negative work-up   Hypertension     Hypothyroidism      followed by pcp   Lesion of lip      04-25-2022  per pt is scheduled for moh's surgery in feb.,  stated not melanoma   Polycythemia      secondary ;  followed by pcp   Rectal polyp     Wears glasses               Past Surgical History:  Procedure Laterality Date   COLONOSCOPY WITH PROPOFOL    01/24/2022    dr legrand   MELANOMA EXCISION   2003    WLE back   RADIOACTIVE SEED IMPLANT   2003     prostate   RETINAL TEAR REPAIR CRYOTHERAPY Right 04/2022   TONSILLECTOMY        child   TRANSANAL RECTAL RESECTION N/A 04/27/2022    Procedure: TRANSANAL RESECTION rectal polyp;  Surgeon: NED BERNARDA, MD;  Location[facility];  Service: General;  Laterality: N/A;             Family History  Problem Relation Age of Onset   Pneumonia Mother     Liver cancer Father     Thyroid  disease Sister     Thyroid  disease Sister     Prostate cancer Brother     Thyroid  disease Brother     Mental retardation Brother     Lung cancer Brother     Bone cancer Brother     Liver cancer Brother     Colon polyps Neg Hx     Colon cancer Neg Hx     Esophageal cancer Neg Hx     Rectal cancer Neg Hx  Stomach cancer Neg Hx          Social History         Socioeconomic History   Marital status: Divorced      Spouse name: Not on file   Number of children: 0   Years of education: Not on file   Highest education level: Not on file  Occupational History   Occupation: retired  Tobacco Use   Smoking status: Former      Current packs/day: 0.00      Average packs/day: 3.5 packs/day for 50.8 years (177.9 ttl pk-yrs)      Types: Cigarettes      Start date: 57      Quit date: 02/03/2016      Years since quitting: 7.6   Smokeless tobacco: Never   Tobacco comments:      cut down to 1 pack when quit in 2017  Vaping Use   Vaping status: Never Used  Substance and Sexual Activity   Alcohol use: Yes      Alcohol/week: 20.0 standard drinks of alcohol      Types: 20 Cans of beer per week   Drug use: Never   Sexual activity: Yes  Other Topics Concern   Not on file  Social History Narrative   Not on file    Social Drivers of Health    Financial Resource Strain: Not on file  Food Insecurity: Not on file  Transportation Needs: Not on file  Physical Activity: Not on file  Stress: Not on file  Social Connections: Not on file  Intimate Partner Violence: Not on file      Current Medications    Current Outpatient Medications:    amLODipine  (NORVASC ) 5 MG tablet, Take 1 tablet (5 mg total) by mouth daily., Disp: 90 tablet, Rfl: 2   levothyroxine  (SYNTHROID ) 125 MCG tablet, TAKE 1 TABLET BY MOUTH EVERY DAY, Disp: 90 tablet, Rfl: 1   Multiple Vitamins-Minerals (CENTRUM SILVER PO), Take 1 tablet by mouth daily after lunch., Disp: , Rfl:    rivaroxaban  (XARELTO ) 20 MG TABS tablet, TAKE 1 TABLET (20 MG TOTAL) BY MOUTH DAILY WITH SUPPER., Disp: 90 tablet, Rfl: 2   tadalafil  (CIALIS ) 10 MG tablet, Take 1 tablet (10 mg total) by mouth every other day as needed for erectile dysfunction. (Patient taking differently: Take 10 mg by mouth every other day as needed for erectile dysfunction.), Disp: 10 tablet, Rfl: 11   Allergies       Allergies  Allergen Reactions   Microplegia Msa-Msg [Cardioplegia Del Nido Formula] Hives and Swelling      M.S.G. in food.   Selenium Gluconate [Selenium] Anaphylaxis and Hives   Latex Other (See Comments)      With prolonged exposure   Sulfa Antibiotics Hives      Not sure reaction, started in infancy      Review of Systems - Negative except as stated above      Physical Examination:    Vitals:   11/08/23 0921  BP: (!) 150/79  Pulse: 88  Resp: 16  Temp: (!) 97.1 F (36.2 C)  SpO2: 98%     Gen: NAD  CV: RRR Pulm: CTA Abd: soft Rectal: palpable polyp in RA distal rectum.     Assessment and Plan:    Tyler Randall is a 78 y.o. male who underwent TAE of polypectomy scar in 2021.  He now appears to have a small recurrence despite no adenomatous tissue being seen within the previous  polypectomy scar.  I have recommended re-excision.  Risks include bleeding, pain and recurrence.  All questions were answered.  Pt would like to proceed.      Diagnoses and all orders for this visit:   Rectal polyp       The plan was discussed in detail with the patient today, who expressed understanding.  The patient has my contact  information, and understands to call me with any additional questions or concerns in the interval.  I would be happy to see the patient back sooner if the need arises.    Bernarda JAYSON Ned, MD Colon and Rectal Surgery [facility]

## 2023-11-08 NOTE — Transfer of Care (Signed)
 Immediate Anesthesia Transfer of Care Note  Patient: Tyler Randall  Procedure(s) Performed: Procedure(s) (LRB): TRANSRECTAL TUMOR EXCISION (N/A)  Patient Location: PACU  Anesthesia Type: MAC  Level of Consciousness: awake, alert , oriented and patient cooperative  Airway & Oxygen Therapy: Patient Spontanous Breathing   Post-op Assessment: Report given to PACU RN and Post -op Vital signs reviewed and stable  Post vital signs: Reviewed and stable  Complications: No apparent anesthesia complications  Last Vitals:  Vitals Value Taken Time  BP 101/59 11/08/23 11:38  Temp    Pulse 86 11/08/23 11:40  Resp 16 11/08/23 11:40  SpO2 98 % 11/08/23 11:40  Vitals shown include unfiled device data.  Last Pain:  Vitals:   11/08/23 0921  TempSrc: Temporal  PainSc: 0-No pain         Complications: No notable events documented.

## 2023-11-08 NOTE — Anesthesia Postprocedure Evaluation (Signed)
 Anesthesia Post Note  Patient: Tyler Randall  Procedure(s) Performed: TRANSRECTAL TUMOR EXCISION     Patient location during evaluation: PACU Anesthesia Type: MAC Level of consciousness: awake and alert Pain management: pain level controlled Vital Signs Assessment: post-procedure vital signs reviewed and stable Respiratory status: spontaneous breathing, nonlabored ventilation, respiratory function stable and patient connected to nasal cannula oxygen Cardiovascular status: stable and blood pressure returned to baseline Postop Assessment: no apparent nausea or vomiting Anesthetic complications: no   No notable events documented.  Last Vitals:  Vitals:   11/08/23 1200 11/08/23 1223  BP: 118/71 (!) 144/76  Pulse: 83 82  Resp: 20 20  Temp:  36.6 C  SpO2: 96% 98%    Last Pain:  Vitals:   11/08/23 1223  TempSrc: Temporal  PainSc: 0-No pain                 Tyler Randall

## 2023-11-08 NOTE — Discharge Instructions (Addendum)
   Beginning the day after surgery:  You may sit in a tub of warm water 2-3 times a day to relieve discomfort.  Eat a regular diet high in fiber.  Avoid foods that give you constipation or diarrhea.  Avoid foods that are difficult to digest, such as seeds, nuts, corn or popcorn.  Do not go any longer than 2 days without a bowel movement.  You may take a dose of Milk of Magnesia if you become constipated.    Drink 6-8 glasses of water daily.  Walking is encouraged.  Avoid strenuous activity and heavy lifting for one month after surgery.    Call the office if you have any questions or concerns.  Call immediately if you develop:   Excessive rectal bleeding (more than a cup or passing large clots)  Increased discomfort  Fever greater than 100 F  Difficulty urinating   Post Anesthesia Home Care Instructions  Activity: Get plenty of rest for the remainder of the day. A responsible individual must stay with you for 24 hours following the procedure.  For the next 24 hours, DO NOT: -Drive a car -Advertising copywriter -Drink alcoholic beverages -Take any medication unless instructed by your physician -Make any legal decisions or sign important papers.  Meals: Start with liquid foods such as gelatin or soup. Progress to regular foods as tolerated. Avoid greasy, spicy, heavy foods. If nausea and/or vomiting occur, drink only clear liquids until the nausea and/or vomiting subsides. Call your physician if vomiting continues.  Special Instructions/Symptoms: Your throat may feel dry or sore from the anesthesia or the breathing tube placed in your throat during surgery. If this causes discomfort, gargle with warm salt water. The discomfort should disappear within 24 hours.  If you had a scopolamine patch placed behind your ear for the management of post- operative nausea and/or vomiting:  1. The medication in the patch is effective for 72 hours, after which it should be removed.  Wrap patch in  a tissue and discard in the trash. Wash hands thoroughly with soap and water. 2. You may remove the patch earlier than 72 hours if you experience unpleasant side effects which may include dry mouth, dizziness or visual disturbances. 3. Avoid touching the patch. Wash your hands with soap and water after contact with the patch.

## 2023-11-09 ENCOUNTER — Encounter (HOSPITAL_BASED_OUTPATIENT_CLINIC_OR_DEPARTMENT_OTHER): Payer: Self-pay | Admitting: General Surgery

## 2023-11-13 LAB — SURGICAL PATHOLOGY

## 2023-11-20 ENCOUNTER — Other Ambulatory Visit: Payer: Self-pay | Admitting: Surgery

## 2023-11-20 DIAGNOSIS — I7121 Aneurysm of the ascending aorta, without rupture: Secondary | ICD-10-CM

## 2023-12-26 ENCOUNTER — Ambulatory Visit

## 2024-01-09 ENCOUNTER — Ambulatory Visit (HOSPITAL_COMMUNITY)
Admission: RE | Admit: 2024-01-09 | Discharge: 2024-01-09 | Disposition: A | Discharge status: Home / Self Care | Source: Ambulatory Visit | Attending: Cardiovascular Disease | Admitting: Cardiovascular Disease

## 2024-01-09 DIAGNOSIS — I7121 Aneurysm of the ascending aorta, without rupture: Secondary | ICD-10-CM | POA: Insufficient documentation

## 2024-01-09 MED ORDER — IOHEXOL 350 MG/ML SOLN
75.0000 mL | Freq: Once | INTRAVENOUS | Status: AC | PRN
  Administered 2024-01-09: 75 mL via INTRAVENOUS

## 2024-01-16 ENCOUNTER — Ambulatory Visit

## 2024-01-16 VITALS — BP 128/78 | HR 77 | Resp 18 | Ht 73.0 in | Wt 135.0 lb

## 2024-01-16 DIAGNOSIS — I7121 Aneurysm of the ascending aorta, without rupture: Secondary | ICD-10-CM

## 2024-01-16 NOTE — Patient Instructions (Signed)

## 2024-01-16 NOTE — Progress Notes (Signed)
 [address] [place] [address]             [phone]            Tyler Randall [id number] 1945/10/09   History of Present Illness:   Tyler Randall is a 78 year old man with medical history of hypertension, DVT, pulmonary embolus, hypothyroidism, prostate cancer, polycythemia vera and vitamin D  deficiency who presents for continued follow-up of ascending thoracic aortic aneurysm.  He has been followed since 2020 and aneurysm has stayed stable in size measuring 4.5 cm - 4.6 cm.  On recent CTA of chest aneurysm measured 4.5 cm.  Echocardiogram from 2020 showed tricuspid aortic valve.  He reports that he has been doing well.  His blood pressure is well-controlled with current medication therapy.  He exercises by weightlifting (lifting around 20 pounds) and walking.  He denies chest pain, shortness of breath and lower leg swelling.   Current Outpatient Medications on File Prior to Visit  Medication Sig Dispense Refill   amLODipine  (NORVASC ) 5 MG tablet Take 1 tablet (5 mg total) by mouth daily. 90 tablet 2   levothyroxine  (SYNTHROID ) 125 MCG tablet TAKE 1 TABLET BY MOUTH EVERY DAY 90 tablet 1   Multiple Vitamins-Minerals (CENTRUM SILVER PO) Take 1 tablet by mouth daily after lunch.     rivaroxaban  (XARELTO ) 20 MG TABS tablet TAKE 1 TABLET (20 MG TOTAL) BY MOUTH DAILY WITH SUPPER. 90 tablet 2   tadalafil  (CIALIS ) 10 MG tablet Take 1 tablet (10 mg total) by mouth every other day as needed for erectile dysfunction. (Patient taking differently: Take 10 mg by mouth every other day as needed for erectile dysfunction.) 10 tablet 11   No current facility-administered medications on file prior to visit.     ROS: Review of Systems  Constitutional: Negative.  Negative for malaise/fatigue.  Respiratory: Negative.  Negative for cough and shortness of breath.   Cardiovascular: Negative.  Negative for chest pain and leg swelling.  Neurological: Negative.  Negative  for headaches.     BP 128/78   Pulse 77   Resp 18   Ht 6' 1 (1.854 m)   Wt 135 lb (61.2 kg)   SpO2 97%   BMI 17.81 kg/m   Physical Exam Constitutional:      Appearance: Normal appearance.  HENT:     Head: Normocephalic and atraumatic.  Cardiovascular:     Rate and Rhythm: Normal rate and regular rhythm.     Heart sounds: Normal heart sounds, S1 normal and S2 normal.  Pulmonary:     Effort: Pulmonary effort is normal.     Breath sounds: Normal breath sounds.  Skin:    General: Skin is warm and dry.  Neurological:     General: No focal deficit present.     Mental Status: He is alert and oriented to person, place, and time.      Imaging: CLINICAL DATA:  78 year old male presents for follow-up of thoracic aortic aneurysm.   EXAM: CT ANGIOGRAPHY CHEST WITH CONTRAST   TECHNIQUE: Multidetector CT imaging of the chest was performed using the standard protocol during bolus administration of intravenous contrast. Multiplanar CT image reconstructions and MIPs were obtained to evaluate the vascular anatomy.   RADIATION DOSE REDUCTION: This exam was performed according to the departmental dose-optimization program which includes automated exposure control, adjustment of the mA and/or kV according to patient size and/or use of iterative reconstruction  technique.   CONTRAST:  75mL OMNIPAQUE  IOHEXOL  350 MG/ML SOLN   COMPARISON:  CTA chest dated 12/27/2022.   FINDINGS: Cardiovascular: No significant change in size of ascending thoracic aortic aneurysm measuring up to 4.5 x 4.5 cm. No evidence of thoracic aortic dissection or periaortic stranding. Heart is normal in size. No pericardial effusion. Calcified and noncalcified plaque of the thoracic aorta. No evidence of central pulmonary embolism.   Mediastinum/Nodes: No enlarged mediastinal, hilar, or axillary lymph nodes. Thyroid  gland, trachea, and esophagus demonstrate no significant findings.   Lungs/Pleura: No  focal consolidation, pleural effusion, or pneumothorax. Unchanged 6 mm subpleural nodule in the right lower (series 302, image 53). Stable bandlike perifissural pleural-parenchymal scarring in the right lower lobe.   Upper Abdomen: No acute abnormality.   Musculoskeletal: No chest wall abnormality. No acute or significant osseous findings.   Review of the MIP images confirms the above findings.   IMPRESSION: 1. Stable size of ascending thoracic aortic aneurysm measuring up to 4.5 cm. Recommend semi-annual imaging followup by CTA or MRA and referral to cardiothoracic surgery if not already obtained. This recommendation follows 2010 ACCF/AHA/AATS/ACR/ASA/SCA/SCAI/SIR/STS/SVM Guidelines for the Diagnosis and Management of Patients With Thoracic Aortic Disease. Circulation. 2010; 121: [id number]. Aortic aneurysm NOS (ICD10-I71.9) 2.  Aortic Atherosclerosis (ICD10-I70.0).     Electronically Signed   By: Harrietta Sherry M.D.   On: 01/09/2024 15:32    A/P:  Aneurysm of ascending aorta without rupture -4.5 cm ascending thoracic aortic aneurysm on CTA of chest. We discussed the natural history and and risk factors for growth of ascending aortic aneurysms. Discussed recommendations to minimize the risk of further expansion or dissection including careful blood pressure control, avoidance of contact sports and heavy lifting, attention to lipid management.  We covered the importance of continued smoking cessation.  The patient does not yet meet surgical criteria of >5.5cm. The patient is aware of signs and symptoms of aortic dissection and when to present to the emergency department   -Follow up in one year with CTA of chest for continued surveillance    Risk Modification:  Statin:  not currently prescribed  Smoking cessation instruction/counseling given:  commended patient for quitting and reviewed strategies for preventing relapses  Patient was counseled on importance of Blood  Pressure Control  They are instructed to contact their Primary Care Physician if they start to have blood pressure readings over 130s/90s. Do not ever stop blood pressure medications on your own, unless instructed by healthcare professional.  Please avoid use of Fluoroquinolones as this can potentially increase your risk of Aortic Rupture and/or Dissection  Patient educated on signs and symptoms of Aortic Dissection, handout also provided in AVS  Manuelita CHRISTELLA Rough, PA-C 01/16/24

## 2024-02-25 ENCOUNTER — Other Ambulatory Visit: Payer: Self-pay | Admitting: Family Medicine

## 2024-02-25 DIAGNOSIS — E039 Hypothyroidism, unspecified: Secondary | ICD-10-CM

## 2024-02-25 DIAGNOSIS — I10 Essential (primary) hypertension: Secondary | ICD-10-CM

## 2024-02-28 ENCOUNTER — Encounter: Payer: Self-pay | Admitting: Family Medicine

## 2024-02-28 ENCOUNTER — Ambulatory Visit (INDEPENDENT_AMBULATORY_CARE_PROVIDER_SITE_OTHER): Admitting: Family Medicine

## 2024-02-28 VITALS — BP 126/66 | HR 74 | Temp 98.4°F | Resp 12 | Ht 73.0 in | Wt 138.8 lb

## 2024-02-28 DIAGNOSIS — D751 Secondary polycythemia: Secondary | ICD-10-CM

## 2024-02-28 DIAGNOSIS — E039 Hypothyroidism, unspecified: Secondary | ICD-10-CM | POA: Diagnosis not present

## 2024-02-28 DIAGNOSIS — I10 Essential (primary) hypertension: Secondary | ICD-10-CM

## 2024-02-28 DIAGNOSIS — Z7901 Long term (current) use of anticoagulants: Secondary | ICD-10-CM

## 2024-02-28 DIAGNOSIS — I2694 Multiple subsegmental pulmonary emboli without acute cor pulmonale: Secondary | ICD-10-CM | POA: Diagnosis not present

## 2024-02-28 DIAGNOSIS — I7121 Aneurysm of the ascending aorta, without rupture: Secondary | ICD-10-CM

## 2024-02-28 MED ORDER — RIVAROXABAN 20 MG PO TABS: ORAL_TABLET | ORAL | 2 refills | Status: AC

## 2024-02-28 NOTE — Progress Notes (Signed)
 Subjective:  Patient ID: Tyler Randall, male    DOB: 08-22-1945  Age: 78 y.o. MRN: [id number]  CC:  Chief Complaint  Patient presents with   Hypothyroidism    Doing well. No questions or concerns.    Follow-up    He did follow up with surgeon on rectal bleeding. Patient reports he is doing better    HPI Tyler Randall presents for follow up.   Hypothyroidism: Lab Results  Component Value Date   TSH 3.78 08/02/2023  Taking medication daily.  Synthroid  125 mcg daily. No new hot or cold intolerance. No new hair or skin changes, heart palpitations or new fatigue. No new weight changes.   Hypertension: Amlodipine  5 mg daily.  Alcohol use discussed previously as well as impact on health.  He does declined need for assistance with cutting back on alcohol, still 4-5 months. Home readings: 120/66 range. BP Readings from Last 3 Encounters:  02/28/24 126/66  01/16/24 128/78  11/08/23 (!) 144/76   Lab Results  Component Value Date   CREATININE 0.93 11/02/2023    Ascending aortic aneurysm Followed by thoracic surgeon, most recently seen October 8.  Overall stable size of 4.5 to 4.6 cm since 2020.  1 year follow-up with CTA chest planned for active surveillance, with surgical criteria of greater than 5.5 cm.  RTC/ER precautions were given.  Chronic anticoagulation On Xarelto  indefinitely with history of DVT, PE, polycythemia.  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  No inhalers are needed, no wheezing dyspnea or chronic cough.  Quit smoking in 2017.  Yearly follow-up of thoracic aortic aneurysm with CT as above.  Currently on Xarelto  20 mg without new bleeding or bruising.  He is followed by urology with history of prostate cancer, seed implant and has experienced hematuria previously, none recently.  Urology - Dr. Watt. Scope in past year - looked ok.   Lab Results  Component Value Date   WBC 5.1 08/23/2023   HGB 16.0 08/23/2023   HCT 48.3 08/23/2023   MCV 98.4  08/23/2023   PLT 199.0 08/23/2023    Rectal polyp With prior rectal bleeding.   visit in September with Dr. Debby.  Underwent surgery of recurrent anterior distal rectal polyp in July.  Plan for fiber supplement daily, MiraLAX as needed with bowel movements, barrier cream on perianal skin for protection and 59-month recheck. Follow-up visit yesterday noted.  No stricture noted, no further defect in the wound.  Mildly inflamed area midline anteriorly.  Noted that the drainage should subside and excoriation will go away once the area of inflammation internally closes completely.  Follow-up as needed. Still doing well - minimal blood with wiping at times.   HM: declines AWV.   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   Deep vein thrombosis (DVT) of proximal vein of left lower extremity (HCC) 03/02/2019   Pulmonary embolus (HCC) 03/02/2019   Prostate cancer (HCC) 03/27/2014   Hypothyroidism 10/06/2013   Vitamin D  deficiency 10/06/2013   Thyroid  condition 11/28/2011   Past Medical History:  Diagnosis Date   AAA (abdominal aortic aneurysm)    followed by dr lucas--  per last CT epic 4.6cm   Allergy 1949   Anticoagulant long-term use    xarelto --- managed by pcp   BPH associated with nocturia    Cancer (HCC) 2003   Cataract 2019   COPD (chronic obstructive pulmonary disease) (HCC)    Depression  History of adenomatous polyp of colon    History of DVT of lower extremity 11/2018   LLE   History of low-risk melanoma 2003   s/p  WLE back superficial   History of nonmelanoma skin cancer    left arm   History of prostate cancer 2003   s/p  radioactive seed implants   History of pulmonary embolism 11/2018   previously followed by dr onesimo (hematology)  per pt negative work-up   Hypertension    Hypothyroidism    followed by pcp   Lesion of lip    04-25-2022  per pt is scheduled for moh's surgery in feb.,  stated not melanoma    Polycythemia    secondary ;  followed by pcp   Rectal polyp    Wears glasses    Past Surgical History:  Procedure Laterality Date   COLONOSCOPY WITH PROPOFOL   01/24/2022   dr legrand   MELANOMA EXCISION  2003   WLE back   RADIOACTIVE SEED IMPLANT  2003   prostate   RETINAL TEAR REPAIR CRYOTHERAPY Right 04/2022   TONSILLECTOMY     child   TRANSANAL RECTAL RESECTION N/A 04/27/2022   Procedure: TRANSANAL RESECTION rectal polyp;  Surgeon: Debby Hila, MD;  Location: [facility];  Service: General;  Laterality: N/A;   TUMOR EXCISION N/A 11/08/2023   Procedure: TRANSRECTAL TUMOR EXCISION;  Surgeon: Debby Hila, MD;  Location: [facility];  Service: General;  Laterality: N/A;  TRANSANAL EXCISION   Allergies  Allergen Reactions   Microplegia Msa-Msg [Cardioplegia Del Nido Formula] Hives and Swelling    M.S.G. in food.   Selenium Gluconate [Selenium] Anaphylaxis and Hives   Latex Other (See Comments)    With prolonged exposure   Quinolones Other (See Comments)    Ascending thoracic aortic aneurysm    Sulfa Antibiotics Hives    Not sure reaction, started in infancy   Prior to Admission medications   Medication Sig Start Date End Date Taking? Authorizing Provider  amLODipine  (NORVASC ) 5 MG tablet TAKE 1 TABLET (5 MG TOTAL) BY MOUTH DAILY. 02/25/24  Yes Levora Reyes SAUNDERS, MD  levothyroxine  (SYNTHROID ) 125 MCG tablet TAKE 1 TABLET BY MOUTH EVERY DAY 02/25/24  Yes Levora Reyes SAUNDERS, MD  Multiple Vitamins-Minerals (CENTRUM SILVER PO) Take 1 tablet by mouth daily after lunch.   Yes [provider]  rivaroxaban  (XARELTO ) 20 MG TABS tablet TAKE 1 TABLET (20 MG TOTAL) BY MOUTH DAILY WITH SUPPER. 02/22/23  Yes Levora Reyes SAUNDERS, MD  tadalafil  (CIALIS ) 10 MG tablet Take 1 tablet (10 mg total) by mouth every other day as needed for erectile dysfunction. Patient taking differently: Take 20 mg by mouth daily as needed for erectile dysfunction. 03/08/22  Yes  Levora Reyes SAUNDERS, MD   Social History   Socioeconomic History   Marital status: Divorced    Spouse name: Not on file   Number of children: 0   Years of education: Not on file   Highest education level: Not on file  Occupational History   Occupation: retired  Tobacco Use   Smoking status: Former    Current packs/day: 0.00    Average packs/day: 3.5 packs/day for 50.8 years (177.9 ttl pk-yrs)    Types: Cigarettes    Start date: 68    Quit date: 02/03/2016    Years since quitting: 8.0   Smokeless tobacco: Never   Tobacco comments:    cut down to 1 pack when quit in 2017  Vaping  Use   Vaping status: Never Used  Substance and Sexual Activity   Alcohol use: Yes    Alcohol/week: 20.0 standard drinks of alcohol    Types: 20 Cans of beer per week    Comment: 3-4 beers/day   Drug use: Never   Sexual activity: Yes  Other Topics Concern   Not on file  Social History Narrative   Not on file   Social Drivers of Health   Financial Resource Strain: Patient Declined (02/24/2024)   Overall Financial Resource Strain (CARDIA)    Difficulty of Paying Living Expenses: Patient declined  Food Insecurity: Patient Declined (02/24/2024)   Hunger Vital Sign    Worried About Running Out of Food in the Last Year: Patient declined    Ran Out of Food in the Last Year: Patient declined  Transportation Needs: Patient Declined (02/24/2024)   PRAPARE - Administrator, Civil Service (Medical): Patient declined    Lack of Transportation (Non-Medical): Patient declined  Physical Activity: Not on file  Stress: Not on file  Social Connections: Unknown (02/24/2024)   Social Connection and Isolation Panel    Frequency of Communication with Friends and Family: Patient declined    Frequency of Social Gatherings with Friends and Family: Patient declined    Attends Religious Services: Patient declined    Database Administrator or Organizations: Patient declined    Attends Hospital Doctor: Not on file    Marital Status: Divorced  Intimate Partner Violence: Not on file    Review of Systems  Constitutional:  Negative for fatigue and unexpected weight change.  Eyes:  Negative for visual disturbance.  Respiratory:  Negative for cough, chest tightness and shortness of breath.   Cardiovascular:  Negative for chest pain, palpitations and leg swelling.  Gastrointestinal:  Negative for abdominal pain and blood in stool (very little. see HPI.).  Neurological:  Negative for dizziness, light-headedness and headaches.     Objective:   Vitals:   02/28/24 1024  BP: 126/66  Pulse: 74  Resp: 12  Temp: 98.4 F (36.9 C)  TempSrc: Temporal  SpO2: 96%  Weight: 138 lb 12.8 oz (63 kg)  Height: 6' 1 (1.854 m)     Physical Exam Vitals reviewed.  Constitutional:      Appearance: He is well-developed.  HENT:     Head: Normocephalic and atraumatic.  Neck:     Vascular: No carotid bruit or JVD.  Cardiovascular:     Rate and Rhythm: Normal rate and regular rhythm.     Heart sounds: Normal heart sounds. No murmur heard. Pulmonary:     Effort: Pulmonary effort is normal.     Breath sounds: Normal breath sounds. No rales.  Musculoskeletal:     Right lower leg: No edema.     Left lower leg: No edema.  Skin:    General: Skin is warm and dry.  Neurological:     Mental Status: He is alert and oriented to person, place, and time.  Psychiatric:        Mood and Affect: Mood normal.        Assessment & Plan:  ZYLEN WENIG is a 78 y.o. male . Essential hypertension - Plan: Comprehensive metabolic panel with GFR, Lipid panel  -  Stable, tolerating current regimen. Medications recently refilled.  Labs pending as above.   Multiple subsegmental pulmonary emboli without acute cor pulmonale (HCC) - Plan: rivaroxaban  (XARELTO ) 20 MG TABS tablet  - Tolerating current dose  of Xarelto , continue same.  Hypothyroidism, unspecified type - Plan: TSH  - Continue same dose  Synthroid , check TSH and adjust plan accordingly.  Polycythemia - Plan: CBC Chronic anticoagulation - Plan: CBC  - As above, check CBC for monitoring, continue Xarelto   Aneurysm of ascending aorta without rupture  - Recent visit with specialist as above.  1 year follow-up.  Stable.   Meds ordered this encounter  Medications   rivaroxaban  (XARELTO ) 20 MG TABS tablet    Sig: TAKE 1 TABLET (20 MG TOTAL) BY MOUTH DAILY WITH SUPPER.    Dispense:  90 tablet    Refill:  2   Patient Instructions  Thank you for coming in today. No change in medications at this time. If there are any concerns on your bloodwork, I will let you know. Take care!     Signed,   Reyes Pines, MD  Primary Care, [facility] 02/28/24 11:10 AM

## 2024-02-28 NOTE — Patient Instructions (Signed)
 Thank you for coming in today. No change in medications at this time. If there are any concerns on your bloodwork, I will let you know. Take care!

## 2024-02-29 LAB — COMPREHENSIVE METABOLIC PANEL WITH GFR
ALT: 9 U/L (ref 0–53)
AST: 20 U/L (ref 0–37)
Albumin: 4.3 g/dL (ref 3.5–5.2)
Alkaline Phosphatase: 54 U/L (ref 39–117)
BUN: 12 mg/dL (ref 6–23)
CO2: 30 meq/L (ref 19–32)
Calcium: 9.5 mg/dL (ref 8.4–10.5)
Chloride: 101 meq/L (ref 96–112)
Creatinine, Ser: 0.71 mg/dL (ref 0.40–1.50)
GFR: 89.4 mL/min (ref >60.00)
Glucose, Bld: 70 mg/dL (ref 70–99)
Potassium: 4.3 meq/L (ref 3.5–5.1)
Sodium: 141 meq/L (ref 135–145)
Total Bilirubin: 2.1 mg/dL — ABNORMAL HIGH (ref 0.2–1.2)
Total Protein: 7.1 g/dL (ref 6.0–8.3)

## 2024-02-29 LAB — LIPID PANEL
Cholesterol: 173 mg/dL (ref 0–200)
HDL: 97 mg/dL (ref >39.00)
LDL Cholesterol: 65 mg/dL (ref 0–99)
NonHDL: 75.76
Total CHOL/HDL Ratio: 2
Triglycerides: 55 mg/dL (ref 0.0–149.0)
VLDL: 11 mg/dL (ref 0.0–40.0)

## 2024-02-29 LAB — CBC
HCT: 46.6 % (ref 39.0–52.0)
Hemoglobin: 15.7 g/dL (ref 13.0–17.0)
MCHC: 33.7 g/dL (ref 30.0–36.0)
MCV: 96.4 fl (ref 78.0–100.0)
Platelets: 200 K/uL (ref 150.0–400.0)
RBC: 4.84 Mil/uL (ref 4.22–5.81)
RDW: 13.9 % (ref 11.5–15.5)
WBC: 5.5 K/uL (ref 4.0–10.5)

## 2024-02-29 LAB — TSH: TSH: 0.32 u[IU]/mL — ABNORMAL LOW (ref 0.35–5.50)

## 2024-03-04 ENCOUNTER — Ambulatory Visit: Payer: Self-pay | Admitting: Family Medicine

## 2024-03-04 DIAGNOSIS — E039 Hypothyroidism, unspecified: Secondary | ICD-10-CM

## 2024-03-04 MED ORDER — LEVOTHYROXINE SODIUM 112 MCG PO TABS: 112.0000 ug | ORAL_TABLET | Freq: Every day | ORAL | 1 refills | Status: AC

## 2024-03-10 ENCOUNTER — Other Ambulatory Visit: Payer: Self-pay

## 2024-03-10 DIAGNOSIS — E039 Hypothyroidism, unspecified: Secondary | ICD-10-CM

## 2024-03-10 NOTE — Telephone Encounter (Signed)
 Copied from CRM #[id number]. Topic: Clinical - Request for Lab/Test Order >> Mar 10, 2024 12:08 PM Alexandria E wrote: Reason for CRM: Patient needing to recheck thyroid  labs in 6 weeks from initial lab on 11/20. Please enter lab order when available. Patient prefers Wednesday's or Thursday's around 11am.

## 2024-03-10 NOTE — Progress Notes (Signed)
 Called patient and scheduled lab visit in 6 weeks  Lab ordered future ordered

## 2024-04-24 ENCOUNTER — Other Ambulatory Visit (INDEPENDENT_AMBULATORY_CARE_PROVIDER_SITE_OTHER)

## 2024-04-24 DIAGNOSIS — E039 Hypothyroidism, unspecified: Secondary | ICD-10-CM

## 2024-04-24 LAB — TSH: TSH: 1.09 u[IU]/mL (ref 0.35–5.50)

## 2024-04-29 ENCOUNTER — Ambulatory Visit: Payer: Self-pay | Admitting: Family Medicine

## 2024-05-12 ENCOUNTER — Encounter (HOSPITAL_COMMUNITY): Payer: Self-pay

## 2024-05-12 ENCOUNTER — Emergency Department (HOSPITAL_COMMUNITY): Admission: EM | Admit: 2024-05-12 | Discharge: 2024-05-12 | Disposition: A | Discharge status: Home / Self Care

## 2024-05-12 ENCOUNTER — Other Ambulatory Visit: Payer: Self-pay

## 2024-05-12 ENCOUNTER — Emergency Department (HOSPITAL_COMMUNITY)

## 2024-05-12 DIAGNOSIS — R079 Chest pain, unspecified: Secondary | ICD-10-CM

## 2024-05-12 DIAGNOSIS — Z91040 Latex allergy status: Secondary | ICD-10-CM | POA: Insufficient documentation

## 2024-05-12 DIAGNOSIS — I2510 Atherosclerotic heart disease of native coronary artery without angina pectoris: Secondary | ICD-10-CM | POA: Insufficient documentation

## 2024-05-12 DIAGNOSIS — E039 Hypothyroidism, unspecified: Secondary | ICD-10-CM | POA: Insufficient documentation

## 2024-05-12 DIAGNOSIS — R0789 Other chest pain: Secondary | ICD-10-CM | POA: Insufficient documentation

## 2024-05-12 DIAGNOSIS — Z7901 Long term (current) use of anticoagulants: Secondary | ICD-10-CM | POA: Insufficient documentation

## 2024-05-12 DIAGNOSIS — Z79899 Other long term (current) drug therapy: Secondary | ICD-10-CM | POA: Insufficient documentation

## 2024-05-12 DIAGNOSIS — I10 Essential (primary) hypertension: Secondary | ICD-10-CM | POA: Insufficient documentation

## 2024-05-12 LAB — CBC
HCT: 43.7 % (ref 39.0–52.0)
Hemoglobin: 15 g/dL (ref 13.0–17.0)
MCH: 33 pg (ref 26.0–34.0)
MCHC: 34.3 g/dL (ref 30.0–36.0)
MCV: 96.3 fL (ref 80.0–100.0)
Platelets: 210 10*3/uL (ref 150–400)
RBC: 4.54 MIL/uL (ref 4.22–5.81)
RDW: 12.7 % (ref 11.5–15.5)
WBC: 7.5 10*3/uL (ref 4.0–10.5)
nRBC: 0 % (ref 0.0–0.2)

## 2024-05-12 LAB — BASIC METABOLIC PANEL WITH GFR
Anion gap: 10 (ref 5–15)
BUN: 12 mg/dL (ref 8–23)
CO2: 27 mmol/L (ref 22–32)
Calcium: 9.6 mg/dL (ref 8.9–10.3)
Chloride: 102 mmol/L (ref 98–111)
Creatinine, Ser: 0.85 mg/dL (ref 0.61–1.24)
GFR, Estimated: >60 mL/min
Glucose, Bld: 123 mg/dL — ABNORMAL HIGH (ref 70–99)
Potassium: 4.7 mmol/L (ref 3.5–5.1)
Sodium: 139 mmol/L (ref 135–145)

## 2024-05-12 LAB — PROTIME-INR
INR: 2.5 — ABNORMAL HIGH (ref 0.8–1.2)
Prothrombin Time: 27.9 s — ABNORMAL HIGH (ref 11.4–15.2)

## 2024-05-12 LAB — TROPONIN T, HIGH SENSITIVITY: Troponin T High Sensitivity: 11 ng/L (ref 0–19)

## 2024-05-12 MED ORDER — IOHEXOL 350 MG/ML SOLN
75.0000 mL | Freq: Once | INTRAVENOUS | Status: AC | PRN
  Administered 2024-05-12: 75 mL via INTRAVENOUS

## 2024-05-12 MED ORDER — PANTOPRAZOLE SODIUM 20 MG PO TBEC: 20.0000 mg | DELAYED_RELEASE_TABLET | Freq: Every day | ORAL | 0 refills | Status: AC

## 2024-05-12 NOTE — Discharge Instructions (Addendum)
 Your workup today was reassuring.  Your aortic aneurysm is stable.  Your heart testing was unremarkable.  Please follow-up with your primary care doctor to see if they would like to pursue any further testing as an outpatient.  Return to the ER for worsening symptoms.  Try taking the Protonix .  If this was due to either ulcers or stomach irritation/reflux that should treat the symptoms.  Please take for at least a week as it does take a few days for this to get in your system.

## 2024-05-12 NOTE — ED Triage Notes (Signed)
 Pt was BIB [facility] from home with complaints of L sided chest pain. Similar to gas pain but gas pain has never lasted this long. He took acid reflux meds for gas pains with no Improvement. HX of aortic aneurysm, and blood clots.  180/100 324 ASA  0.4 nitroglycerin  18ga LAC  123/78 after meds 110 HR 97% RA 171 CBG

## 2024-05-12 NOTE — ED Provider Notes (Signed)
 " [place] EMERGENCY DEPARTMENT AT [facility] Provider Note   CSN: [id number] Arrival date & time: 05/12/24  1509     Patient presents with: No chief complaint on file.   REASON HELZER is a 79 y.o. male.   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  The patient states that he has been having chest pain now for the past 7 or 8 hours.  Reports this started at 9 AM.  Reports that his the pain is mostly in the left side.  Does not radiate.  He denies any leg swelling.  He came to the ER today further evaluation regarding this.        Prior to Admission medications  Medication Sig Start Date End Date Taking? Authorizing Provider  pantoprazole  (PROTONIX ) 20 MG tablet Take 1 tablet (20 mg total) by mouth daily. 05/12/24  Yes Ula Prentice SAUNDERS, MD  amLODipine  (NORVASC ) 5 MG tablet TAKE 1 TABLET (5 MG TOTAL) BY MOUTH DAILY. 02/25/24   Levora Reyes SAUNDERS, MD  levothyroxine  (SYNTHROID ) 112 MCG tablet Take 1 tablet (112 mcg total) by mouth daily. 03/04/24   Levora Reyes SAUNDERS, MD  Multiple Vitamins-Minerals (CENTRUM SILVER PO) Take 1 tablet by mouth daily after lunch.    [provider]  rivaroxaban  (XARELTO ) 20 MG TABS tablet TAKE 1 TABLET (20 MG TOTAL) BY MOUTH DAILY WITH SUPPER. 02/28/24   Levora Reyes SAUNDERS, MD  tadalafil  (CIALIS ) 10 MG tablet Take 1 tablet (10 mg total) by mouth every other day as needed for erectile dysfunction. Patient taking differently: Take 20 mg by mouth daily as needed for erectile dysfunction. 03/08/22   Levora Reyes SAUNDERS, MD    Allergies: Microplegia msa-msg [cardioplegia del nido formula], Selenium gluconate [selenium], Latex, Quinolones, and Sulfa antibiotics    Review of Systems  Cardiovascular:  Positive for chest pain.  All other systems reviewed and are negative.   Updated Vital Signs BP  132/81   Pulse 83   Temp 98.1 F (36.7 C) (Oral)   Resp 15   Ht 6' 2 (1.88 m)   Wt 59 kg   SpO2 100%   BMI 16.69 kg/m   Physical Exam Vitals and nursing note reviewed.   Gen: NAD Eyes: PERRL, EOMI HEENT: no oropharyngeal swelling Neck: trachea midline Resp: clear to auscultation bilaterally Card: RRR, no murmurs, rubs, or gallops Abd: nontender, nondistended Extremities: no calf tenderness, no edema Vascular: 2+ radial pulses bilaterally, 2+ DP pulses bilaterally Neuro: no focal deficits Skin: no rashes Psyc: acting appropriately   (all labs ordered are listed, but only abnormal results are displayed) Labs Reviewed  BASIC METABOLIC PANEL WITH GFR - Abnormal; Notable for the following components:      Result Value   Glucose, Bld 123 (*)    All other components within normal limits  PROTIME-INR - Abnormal; Notable for the following components:   Prothrombin Time 27.9 (*)    INR 2.5 (*)    All other components within normal limits  CBC  TROPONIN T, HIGH SENSITIVITY    EKG: None  Radiology: CT Angio Chest PE W and/or Wo Contrast Result Date: 05/12/2024 CLINICAL DATA:  Chest pain, pulmonary embolism suspected EXAM: CT ANGIOGRAPHY CHEST WITH CONTRAST TECHNIQUE: Multidetector CT imaging of the chest was performed using the standard protocol during bolus administration of intravenous contrast. Multiplanar CT image reconstructions and MIPs were obtained  to evaluate the vascular anatomy. RADIATION DOSE REDUCTION: This exam was performed according to the departmental dose-optimization program which includes automated exposure control, adjustment of the mA and/or kV according to patient size and/or use of iterative reconstruction technique. CONTRAST:  75mL OMNIPAQUE  IOHEXOL  350 MG/ML SOLN COMPARISON:  01/09/2024, 05/12/2024 FINDINGS: Cardiovascular: This is a technically adequate evaluation of the pulmonary vasculature. No filling defects or pulmonary emboli. The heart is  unremarkable without pericardial effusion. Stable 4.6 cm ascending thoracic aortic aneurysm. Atherosclerosis of the aorta and coronary vasculature. Mediastinum/Nodes: No enlarged mediastinal, hilar, or axillary lymph nodes. Thyroid  gland, trachea, and esophagus demonstrate no significant findings. Lungs/Pleura: No acute airspace disease, effusion, or pneumothorax. Chronic scarring within the right lower lobe. Central airways are patent. Upper Abdomen: No acute abnormality. Musculoskeletal: No acute or destructive bony abnormalities. Reconstructed images demonstrate no additional findings. Review of the MIP images confirms the above findings. IMPRESSION: 1. No evidence of pulmonary embolus. 2. Stable 4.6 cm ascending thoracic aortic aneurysm. Recommend semi-annual imaging followup by CTA or MRA and referral to cardiothoracic surgery if not already obtained. This recommendation follows 2010 ACCF/AHA/AATS/ACR/ASA/SCA/SCAI/SIR/STS/SVM Guidelines for the Diagnosis and Management of Patients With Thoracic Aortic Disease. Circulation. 2010; 121: Z733-z630. Aortic aneurysm NOS (ICD10-I71.9) 3. Aortic Atherosclerosis (ICD10-I70.0). Coronary artery atherosclerosis. Electronically Signed   By: Ozell Daring M.D.   On: 05/12/2024 18:57   DG Chest 2 View Result Date: 05/12/2024 CLINICAL DATA:  Chest pain EXAM: CHEST - 2 VIEW COMPARISON:  11/08/2018 chest radiograph.  CTA chest 01/09/2024. FINDINGS: Hyperinflation. Numerous leads and wires project over the chest. Midline trachea. Normal heart size and mediastinal contours. No pleural effusion or pneumothorax. Biapical pleural thickening. Diffuse peribronchial thickening. IMPRESSION: Hyperinflation and interstitial thickening, most consistent with COPD/chronic bronchitis. No acute superimposed process. Electronically Signed   By: Rockey Kilts M.D.   On: 05/12/2024 15:58     Procedures   Medications Ordered in the ED  iohexol  (OMNIPAQUE ) 350 MG/ML injection 75 mL (75 mLs  Intravenous Contrast Given 05/12/24 1849)                                    Medical Decision Making 79 year old male with past medical history of coronary artery disease, hypertension, hyperlipidemia, and pulmonary embolism presenting to the emergency department today with chest pain.  I will further evaluate the patient here with basic labs Wels and EKG, chest x-ray, troponin for further evaluation for ACS, pulmonary edema, pulm infiltrates, pneumothorax.  Given his history of aortic pathology and PE will obtain CT angiogram to evaluate for pulmonary embolism or aneurysm.  I will reevaluate for ultimate disposition.  The patient's labs are reassuring.  EKG is nonischemic.  Patient's troponin is negative and was drawn after 6 hours since symptom onset.  CT angiogram is stable and does not show any pulmonary emboli.  The patient is discharged with return precautions.  Amount and/or Complexity of Data Reviewed Labs: ordered. Radiology: ordered.  Risk Prescription drug management.        Final diagnoses:  Chest pain, unspecified type    ED Discharge Orders          Ordered    pantoprazole  (PROTONIX ) 20 MG tablet  Daily        05/12/24 1904               Ula Prentice SAUNDERS, MD 05/12/24 1904  "

## 2024-08-28 ENCOUNTER — Encounter: Admitting: Family Medicine
